# Patient Record
Sex: Female | Born: 1980 | Race: Black or African American | Hispanic: No | Marital: Single | Smoking: Current some day smoker
Health system: Southern US, Community
[De-identification: ages and names within clinical notes are randomized; demographics above are authoritative.]

## PROBLEM LIST (undated history)

## (undated) DIAGNOSIS — N390 Urinary tract infection, site not specified: Secondary | ICD-10-CM

## (undated) DIAGNOSIS — R51 Headache: Secondary | ICD-10-CM

## (undated) DIAGNOSIS — M5430 Sciatica, unspecified side: Secondary | ICD-10-CM

## (undated) DIAGNOSIS — R519 Headache, unspecified: Secondary | ICD-10-CM

## (undated) DIAGNOSIS — J45909 Unspecified asthma, uncomplicated: Secondary | ICD-10-CM

## (undated) HISTORY — PX: TONSILLECTOMY: SUR1361

## (undated) HISTORY — DX: Headache: R51

## (undated) HISTORY — PX: ENDOMETRIAL ABLATION: SHX621

## (undated) HISTORY — PX: CHOLECYSTECTOMY: SHX55

## (undated) HISTORY — PX: SHOULDER SURGERY: SHX246

## (undated) HISTORY — PX: APPENDECTOMY: SHX54

## (undated) HISTORY — DX: Unspecified asthma, uncomplicated: J45.909

## (undated) HISTORY — PX: KNEE ARTHROSCOPY: SUR90

## (undated) HISTORY — DX: Headache, unspecified: R51.9

---
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
## 1999-01-10 ENCOUNTER — Encounter: Payer: Self-pay | Admitting: Emergency Medicine

## 1999-01-10 ENCOUNTER — Emergency Department (HOSPITAL_COMMUNITY): Admission: EM | Admit: 1999-01-10 | Discharge: 1999-01-10 | Payer: Self-pay | Admitting: Emergency Medicine

## 1999-03-18 ENCOUNTER — Encounter: Admission: RE | Admit: 1999-03-18 | Discharge: 1999-06-16 | Payer: Self-pay | Admitting: Orthopedic Surgery

## 2000-01-27 ENCOUNTER — Encounter: Payer: Self-pay | Admitting: Orthopedic Surgery

## 2000-01-27 ENCOUNTER — Encounter: Admission: RE | Admit: 2000-01-27 | Discharge: 2000-01-27 | Payer: Self-pay | Admitting: Orthopedic Surgery

## 2000-07-18 ENCOUNTER — Other Ambulatory Visit: Admission: RE | Admit: 2000-07-18 | Discharge: 2000-07-18 | Payer: Self-pay | Admitting: Obstetrics and Gynecology

## 2000-12-28 ENCOUNTER — Ambulatory Visit (HOSPITAL_BASED_OUTPATIENT_CLINIC_OR_DEPARTMENT_OTHER): Admission: RE | Admit: 2000-12-28 | Discharge: 2000-12-29 | Payer: Self-pay | Admitting: Orthopedic Surgery

## 2001-07-27 ENCOUNTER — Encounter: Payer: Self-pay | Admitting: Obstetrics and Gynecology

## 2001-07-27 ENCOUNTER — Ambulatory Visit (HOSPITAL_COMMUNITY): Admission: RE | Admit: 2001-07-27 | Discharge: 2001-07-27 | Payer: Self-pay | Admitting: Obstetrics and Gynecology

## 2001-10-03 ENCOUNTER — Other Ambulatory Visit: Admission: RE | Admit: 2001-10-03 | Discharge: 2001-10-03 | Payer: Self-pay | Admitting: Obstetrics and Gynecology

## 2002-02-08 ENCOUNTER — Ambulatory Visit (HOSPITAL_COMMUNITY): Admission: RE | Admit: 2002-02-08 | Discharge: 2002-02-08 | Payer: Self-pay | Admitting: Obstetrics and Gynecology

## 2002-02-08 ENCOUNTER — Encounter (INDEPENDENT_AMBULATORY_CARE_PROVIDER_SITE_OTHER): Payer: Self-pay

## 2003-08-08 ENCOUNTER — Ambulatory Visit (HOSPITAL_COMMUNITY): Admission: RE | Admit: 2003-08-08 | Discharge: 2003-08-08 | Payer: Self-pay | Admitting: Internal Medicine

## 2004-05-08 ENCOUNTER — Encounter: Admission: RE | Admit: 2004-05-08 | Discharge: 2004-05-08 | Payer: Self-pay | Admitting: Family Medicine

## 2004-08-26 ENCOUNTER — Encounter: Admission: RE | Admit: 2004-08-26 | Discharge: 2004-08-26 | Payer: Self-pay | Admitting: Obstetrics and Gynecology

## 2005-11-08 ENCOUNTER — Emergency Department (HOSPITAL_COMMUNITY): Admission: EM | Admit: 2005-11-08 | Discharge: 2005-11-08 | Payer: Self-pay | Admitting: Emergency Medicine

## 2005-11-10 ENCOUNTER — Emergency Department (HOSPITAL_COMMUNITY): Admission: EM | Admit: 2005-11-10 | Discharge: 2005-11-10 | Payer: Self-pay | Admitting: Emergency Medicine

## 2005-11-12 ENCOUNTER — Emergency Department (HOSPITAL_COMMUNITY): Admission: EM | Admit: 2005-11-12 | Discharge: 2005-11-12 | Payer: Self-pay | Admitting: Emergency Medicine

## 2005-11-14 ENCOUNTER — Emergency Department (HOSPITAL_COMMUNITY): Admission: EM | Admit: 2005-11-14 | Discharge: 2005-11-14 | Payer: Self-pay | Admitting: *Deleted

## 2007-04-13 ENCOUNTER — Emergency Department (HOSPITAL_COMMUNITY): Admission: EM | Admit: 2007-04-13 | Discharge: 2007-04-14 | Payer: Self-pay | Admitting: *Deleted

## 2008-07-26 ENCOUNTER — Emergency Department (HOSPITAL_COMMUNITY): Admission: EM | Admit: 2008-07-26 | Discharge: 2008-07-26 | Payer: Self-pay | Admitting: Emergency Medicine

## 2009-04-04 ENCOUNTER — Emergency Department (HOSPITAL_COMMUNITY): Admission: EM | Admit: 2009-04-04 | Discharge: 2009-04-04 | Payer: Self-pay | Admitting: Emergency Medicine

## 2009-09-25 ENCOUNTER — Emergency Department (HOSPITAL_COMMUNITY): Admission: EM | Admit: 2009-09-25 | Discharge: 2009-09-25 | Payer: Self-pay | Admitting: Emergency Medicine

## 2010-07-20 ENCOUNTER — Emergency Department (HOSPITAL_COMMUNITY)
Admission: EM | Admit: 2010-07-20 | Discharge: 2010-07-20 | Payer: Self-pay | Source: Home / Self Care | Admitting: Emergency Medicine

## 2010-09-08 ENCOUNTER — Ambulatory Visit
Admission: RE | Admit: 2010-09-08 | Discharge: 2010-09-08 | Payer: Self-pay | Source: Home / Self Care | Attending: Family Medicine | Admitting: Family Medicine

## 2010-09-08 ENCOUNTER — Encounter: Payer: Self-pay | Admitting: Family Medicine

## 2010-09-08 DIAGNOSIS — M2390 Unspecified internal derangement of unspecified knee: Secondary | ICD-10-CM | POA: Insufficient documentation

## 2010-09-08 DIAGNOSIS — S83289A Other tear of lateral meniscus, current injury, unspecified knee, initial encounter: Secondary | ICD-10-CM | POA: Insufficient documentation

## 2010-09-09 ENCOUNTER — Ambulatory Visit (HOSPITAL_COMMUNITY)
Admission: RE | Admit: 2010-09-09 | Discharge: 2010-09-09 | Payer: Self-pay | Source: Home / Self Care | Attending: Family Medicine | Admitting: Family Medicine

## 2010-09-27 ENCOUNTER — Encounter: Payer: Self-pay | Admitting: Family Medicine

## 2010-10-06 ENCOUNTER — Ambulatory Visit
Admission: RE | Admit: 2010-10-06 | Discharge: 2010-10-06 | Payer: Self-pay | Source: Home / Self Care | Attending: Family Medicine | Admitting: Family Medicine

## 2010-10-06 ENCOUNTER — Other Ambulatory Visit: Payer: Self-pay | Admitting: Family Medicine

## 2010-10-06 ENCOUNTER — Emergency Department (HOSPITAL_COMMUNITY)
Admission: EM | Admit: 2010-10-06 | Discharge: 2010-10-06 | Payer: Self-pay | Source: Home / Self Care | Admitting: Emergency Medicine

## 2010-10-06 DIAGNOSIS — S83206A Unspecified tear of unspecified meniscus, current injury, right knee, initial encounter: Secondary | ICD-10-CM

## 2010-10-06 DIAGNOSIS — M25569 Pain in unspecified knee: Secondary | ICD-10-CM

## 2010-10-06 LAB — POCT URINALYSIS DIPSTICK
Bilirubin Urine: NEGATIVE
Ketones, ur: NEGATIVE mg/dL
Nitrite: NEGATIVE
Protein, ur: 30 mg/dL — AB
Specific Gravity, Urine: 1.025 (ref 1.005–1.030)
Urine Glucose, Fasting: NEGATIVE mg/dL
Urobilinogen, UA: 0.2 mg/dL (ref 0.0–1.0)
pH: 6 (ref 5.0–8.0)

## 2010-10-06 LAB — WET PREP, GENITAL: Yeast Wet Prep HPF POC: NONE SEEN

## 2010-10-07 LAB — URINE CULTURE: Culture  Setup Time: 201201311650

## 2010-10-07 LAB — GC/CHLAMYDIA PROBE AMP, GENITAL
Chlamydia, DNA Probe: NEGATIVE
GC Probe Amp, Genital: NEGATIVE

## 2010-10-08 NOTE — Assessment & Plan Note (Addendum)
Summary: NP,RT KNEE PAIN/ISSUES,MC   Vital Signs:  Patient profile:   30 year old female Height:      66 inches Weight:      320 pounds BMI:     51.84 BP sitting:   157 / 94  Vitals Entered By: Lillia Pauls CMA (September 08, 2010 2:58 PM)   History of Present Illness: Referred by Jaynee Eagles at [facility].  Shooting, spasming pain in posterior and lateral aspect of R knee since 1-1.5 years ago.    Occasionally the joint pops.  Hurts at night, sitting, standing.  2 previous arthroscopic surgeries for dislocation after slipping on acorn and for arthritis of R knee approx 7 and 5 years ago with long period of relief.  Able to walk but trouble now with steps.  Has gotten some relief from muscle relaxant but pain persists.  Ibuprofen irritates stomach.    Murphy-Wainer operated on R knee x 2, arthroscopy No mechanical locking up No giving way  Preventive Screening-Counseling & Management  Alcohol-Tobacco     Smoking Status: never  Allergies (verified): 1)  ! Ibuprofen  Past History:  Past Medical History: Morbid obesity Patellar dislocation,  ~2000, R  Past Surgical History: as above  R knee arthroscopy x 2,  ~ 2000  Social History: UnemployedSmoking Status:  never  Review of Systems       REVIEW OF SYSTEMS  GEN: No systemic complaints, no fevers, chills, sweats, or other acute illnesses MSK: Detailed in the HPI GI: tolerating PO intake without difficulty Neuro: No numbness, parasthesias, or tingling associated. Otherwise the pertinent positives of the ROS are noted above.    Physical Exam  General:  GEN: Well-developed,well-nourished,in no acute distress; alert,appropriate and cooperative throughout examination, obese HEENT: Normocephalic and atraumatic without obvious abnormalities. No apparent alopecia or balding. Ears, externally no deformities PULM: Breathing comfortably in no respiratory distress EXT: No clubbing, cyanosis, or edema PSYCH: Normally  interactive. Cooperative during the interview. Pleasant. Friendly and conversant. Not anxious or depressed appearing. Normal, full affect.  Msk:  R knee: 2 scars from prior arthroscopic surgeries.  normal ROM, no joint warmth, no redness over joints, no joint deformities, no joint instability, joint tenderness, crepitation, and joint swelling.  Pain with knee flexion. Positive Lochman's. Positive bounce test.  Negative anterior and posterior drawer.  Full ROM in R hip.    Knee Exam  Sensory:    Gross coordination and sensation were normal.    Motor:    Motor strength 5/5 bilaterally for quadriceps  Knee Exam:    Right:    Inspection:  Normal    Palpation:  Abnormal       Location:  lateral capsule    Stability:  stable    Tenderness:  no    Swelling:  diffuse    Erythema:  no    mild effusion    Range of Motion:       Flexion-Active: 115 degrees       Extension-Active: full       Flexion-Passive: 115 degrees       Extension-Passive: full    Left:    Inspection:  Normal    Palpation:  Normal    Stability:  stable    Tenderness:  no    Swelling:  no    Erythema:  no    Range of Motion:       Flexion-Active: full       Extension-Active: full  Flexion-Passive: full       Extension-Passive: full  Special tests:    bounce home test pos mcmurrays pos  Vertical patellar catching:    Right positive; Left negative Horizontal patellar catching:    Right positive; Left negative Patellofemoral joint crepitus:    Right positive; Left negative Anterior drawer:    Right negative; Left negative Posterior drawer:    Right negative; Left negative Gravity tibial sag :    Right negative; Left negative MCL:    Right negative; Left negative LCL:    Right negative; Left negative   Impression & Recommendations:  Problem # 1:  KNEE PAIN (JWJ-191.47) Assessment New Chronic posterior and lateral knee pain in a patient with a history of knee dislocation and arthritic  changes.  Pain may be secondary to meniscal tear. suspect internal derangement, lateteral vs. OA flare Recommended continued weight loss Switched analgesic to Mobic  Check weightbearing knee films  Knee Injection, R Patient verbally consented to procedure. Risks, benefits, and alternatives explained. Sterilely prepped with betadine. Ethyl cholride used for anesthesia. 9 cc Lidocaine 1% mixed with 1 cc of Kenalog 40 mg injected using the anterolateral approach without difficulty. No complications with procedure and tolerated well. Patient had decreased pain post-injection.   Her updated medication list for this problem includes:    Meloxicam 15 Mg Tabs (Meloxicam) .Marland Kitchen... 1 by mouth daily  Orders: Diagnostic X-Ray/Fluoroscopy (Diagnostic X-Ray/Flu) Joint Aspirate / Injection, Large (20610) Kenalog 10mg  (4units) (J3301)  Problem # 2:  INTERNAL DERANGEMENT, RIGHT KNEE (ICD-717.9) Assessment: New  Orders: Joint Aspirate / Injection, Large (20610) Kenalog 10mg  (4units) (J3301)  Complete Medication List: 1)  Meloxicam 15 Mg Tabs (Meloxicam) .Marland Kitchen.. 1 by mouth daily  Patient Instructions: 1)  After you have an injection of any joint, the numbing medicine will make it feel better for a few hours. Later tonight, it is common for the joint to feel worse. The steroid will take 48-72 hours to start working - it is the thing that will likely provide the most relief. 2)  Ice the joint where you had the injection at least 2-3 times a day for 20 minutes for 3 days. If have had swelling, pain in the joint itself, ice for 1 week. 3)  You can use an ice bag, frozen peas or corn, an ice pack - all work  4)  RECHECK IN 1 MONTH Prescriptions: MELOXICAM 15 MG TABS (MELOXICAM) 1 by mouth daily  #30 x 1   Entered and Authorized by:   Hannah Beat MD   Signed by:   Hannah Beat MD on 09/08/2010   Method used:   Print then Give to Patient   RxID:   [id number]    Orders Added: 1)  Diagnostic  X-Ray/Fluoroscopy [Diagnostic X-Ray/Flu] 2)  New Patient Level III [96295] 3)  Joint Aspirate / Injection, Large [20610] 4)  Kenalog 10mg  (4units) [J3301]  Appended Document: NP,RT KNEE PAIN/ISSUES,MC Lachman exam was negative. Documentation error.

## 2010-10-08 NOTE — Op Note (Signed)
Summary: Consent  Consent   Imported By: Marily Memos 09/09/2010 09:47:04  _____________________________________________________________________  External Attachment:    Type:   Image     Comment:   External Document

## 2010-10-13 ENCOUNTER — Ambulatory Visit (HOSPITAL_COMMUNITY)
Admission: RE | Admit: 2010-10-13 | Discharge: 2010-10-13 | Disposition: A | Discharge status: Home / Self Care | Payer: Self-pay | Source: Ambulatory Visit | Attending: Family Medicine | Admitting: Family Medicine

## 2010-10-13 ENCOUNTER — Inpatient Hospital Stay (INDEPENDENT_AMBULATORY_CARE_PROVIDER_SITE_OTHER)
Admission: RE | Admit: 2010-10-13 | Discharge: 2010-10-13 | Disposition: A | Discharge status: Home / Self Care | Payer: Self-pay | Source: Ambulatory Visit | Attending: Family Medicine | Admitting: Family Medicine

## 2010-10-13 DIAGNOSIS — M25469 Effusion, unspecified knee: Secondary | ICD-10-CM | POA: Insufficient documentation

## 2010-10-13 DIAGNOSIS — IMO0002 Reserved for concepts with insufficient information to code with codable children: Secondary | ICD-10-CM | POA: Insufficient documentation

## 2010-10-13 DIAGNOSIS — M25569 Pain in unspecified knee: Secondary | ICD-10-CM | POA: Insufficient documentation

## 2010-10-13 DIAGNOSIS — J019 Acute sinusitis, unspecified: Secondary | ICD-10-CM

## 2010-10-13 DIAGNOSIS — M1710 Unilateral primary osteoarthritis, unspecified knee: Secondary | ICD-10-CM | POA: Insufficient documentation

## 2010-10-13 DIAGNOSIS — M23349 Other meniscus derangements, anterior horn of lateral meniscus, unspecified knee: Secondary | ICD-10-CM | POA: Insufficient documentation

## 2010-10-13 DIAGNOSIS — S83206A Unspecified tear of unspecified meniscus, current injury, right knee, initial encounter: Secondary | ICD-10-CM

## 2010-10-14 NOTE — Assessment & Plan Note (Signed)
Summary: f/u,mc   Vital Signs:  Patient profile:   30 year old female BP sitting:   159 / 89  Vitals Entered By: Lillia Pauls CMA (October 06, 2010 1:49 PM)  History of Present Illness: 30 year old female, s/p knee injection last OV, did great for 2 weeks, now with return of pain, worsening of symptoms, and worsened effusion. Mechanical giving way. No locking up of joint.  Last OV: Referred by Jaynee Eagles at [facility].  Shooting, spasming pain in posterior and lateral aspect of R knee since 1-1.5 years ago.    Occasionally the joint pops.  Hurts at night, sitting, standing.  2 previous arthroscopic surgeries for dislocation after slipping on acorn and for arthritis of R knee approx 7 and 5 years ago with long period of relief.  Able to walk but trouble now with steps.  Has gotten some relief from muscle relaxant but pain persists.  Ibuprofen irritates stomach.    Murphy-Wainer operated on R knee x 2, arthroscopy No mechanical locking up No giving way   REVIEW OF SYSTEMS  GEN: No systemic complaints, no fevers, chills, sweats, or other acute illnesses MSK: Detailed in the HPI GI: tolerating PO intake without difficulty Neuro: No numbness, parasthesias, or tingling associated. Otherwise the pertinent positives of the ROS are noted above.    Allergies: 1)  ! Ibuprofen  Physical Exam  General:  GEN: Well-developed,well-nourished,in no acute distress; alert,appropriate and cooperative throughout examination HEENT: Normocephalic and atraumatic without obvious abnormalities. No apparent alopecia or balding. Ears, externally no deformities PULM: Breathing comfortably in no respiratory distress EXT: No clubbing, cyanosis, or edema PSYCH: Normally interactive. Cooperative during the interview. Pleasant. Friendly and conversant. Not anxious or depressed appearing. Normal, full affect.  Msk:  R knee: 2 scars from prior arthroscopic surgeries.    Full ext, flexion to 110. no joint  warmth, no redness over joints, no joint deformities, no joint instability  TTP with patellar compression, patellar facet loading. TTP medial and lateral joint lines mildly at pes + bounce home + mcmurrays   Lochman's. Positive bounce test.  Negative anterior and posterior drawer.  Full ROM in R hip.   Impression & Recommendations:  Problem # 1:  INTERNAL DERANGEMENT, RIGHT KNEE (ICD-717.9) Assessment Deteriorated Return of symptoms within 2 weeks of injection, now knee clinically worsened with ballotable effusion. Obtain MRI of the right knee to evaluate for probable degenerative meniscal tear. + mcmurrays and flexion pinch.  cont mobic, ice.  Image knee to define if correctable via surgical intervention.  Orders: MRI without Contrast (MRI w/o Contrast)  Problem # 2:  KNEE PAIN (ICD-719.46)  Her updated medication list for this problem includes:    Meloxicam 15 Mg Tabs (Meloxicam) .Marland Kitchen... 1 by mouth daily  Orders: MRI without Contrast (MRI w/o Contrast)  Complete Medication List: 1)  Meloxicam 15 Mg Tabs (Meloxicam) .Marland Kitchen.. 1 by mouth daily  Patient Instructions: 1)  MRI R Knee:   Orders Added: 1)  MRI without Contrast [MRI w/o Contrast] 2)  Est. Patient Level III [16109]  Appended Document: f/u,mc MRI SCHD FOR 10/13/10 TUES AT 7 PM AT [place]. GO TO [address] TO THE [facility] 1ST FLR TO REGISTER. [phone]

## 2010-11-17 ENCOUNTER — Encounter: Payer: Self-pay | Admitting: *Deleted

## 2011-01-08 ENCOUNTER — Emergency Department (HOSPITAL_COMMUNITY): Payer: Self-pay

## 2011-01-08 ENCOUNTER — Emergency Department (HOSPITAL_COMMUNITY)
Admission: EM | Admit: 2011-01-08 | Discharge: 2011-01-08 | Disposition: A | Discharge status: Home / Self Care | Payer: Self-pay | Attending: Emergency Medicine | Admitting: Emergency Medicine

## 2011-01-08 DIAGNOSIS — Y93K1 Activity, walking an animal: Secondary | ICD-10-CM | POA: Insufficient documentation

## 2011-01-08 DIAGNOSIS — Y92009 Unspecified place in unspecified non-institutional (private) residence as the place of occurrence of the external cause: Secondary | ICD-10-CM | POA: Insufficient documentation

## 2011-01-08 DIAGNOSIS — S6990XA Unspecified injury of unspecified wrist, hand and finger(s), initial encounter: Secondary | ICD-10-CM | POA: Insufficient documentation

## 2011-01-08 DIAGNOSIS — X500XXA Overexertion from strenuous movement or load, initial encounter: Secondary | ICD-10-CM | POA: Insufficient documentation

## 2011-01-08 DIAGNOSIS — S6980XA Other specified injuries of unspecified wrist, hand and finger(s), initial encounter: Secondary | ICD-10-CM | POA: Insufficient documentation

## 2011-01-08 DIAGNOSIS — S6390XA Sprain of unspecified part of unspecified wrist and hand, initial encounter: Secondary | ICD-10-CM | POA: Insufficient documentation

## 2011-01-13 ENCOUNTER — Emergency Department (HOSPITAL_COMMUNITY)
Admission: EM | Admit: 2011-01-13 | Discharge: 2011-01-13 | Disposition: A | Discharge status: Home / Self Care | Payer: Self-pay | Attending: Emergency Medicine | Admitting: Emergency Medicine

## 2011-01-13 ENCOUNTER — Emergency Department (HOSPITAL_COMMUNITY): Payer: Self-pay

## 2011-01-13 DIAGNOSIS — M25569 Pain in unspecified knee: Secondary | ICD-10-CM | POA: Insufficient documentation

## 2011-01-13 DIAGNOSIS — IMO0002 Reserved for concepts with insufficient information to code with codable children: Secondary | ICD-10-CM | POA: Insufficient documentation

## 2011-01-13 DIAGNOSIS — X58XXXA Exposure to other specified factors, initial encounter: Secondary | ICD-10-CM | POA: Insufficient documentation

## 2011-01-13 LAB — URINE MICROSCOPIC-ADD ON

## 2011-01-13 LAB — URINALYSIS, ROUTINE W REFLEX MICROSCOPIC
Bilirubin Urine: NEGATIVE
Glucose, UA: NEGATIVE mg/dL
Specific Gravity, Urine: 1.021 (ref 1.005–1.030)
pH: 5.5 (ref 5.0–8.0)

## 2011-01-14 LAB — URINE CULTURE

## 2011-01-22 NOTE — H&P (Signed)
[facility]  Patient:    Tiffany Conley, Tiffany Conley Visit Number: [id number] MRN: [id number]          Service Type: Attending:  Guy Sandifer. Arleta Creek, M.D. Dictated by:   Guy Sandifer Arleta Creek, M.D. Adm. Date:  02/08/02                           History and Physical  HISTORY OF PRESENT ILLNESS:   This patient is a 30 year old, single, black female, G0, P0 with known endometriosis, who has increasing pelvic pain and irregular bleeding.  She is status post laparoscopy and ablation of endometriosis in 1998.  Since then, she has continued to have irregular bleeding.  This was controlled with Provera.  However, the bleeding has become aggressively irregular.  Her pain has also increased.  Ultrasound on July 27, 2001, at [facility] revealed a normal uterus and left ovary.  The right ovary could not be visualized.  The patient was given a trial of Lupron Depot.  After two injection, she had prominent vasomotor symptoms but no relief of her pain.  She was then given continuous birth control pills over several days to regain control of her bleeding.  Repeat ultrasound in my office on December 19, 2001, revealed uterus measuring 6.86 x 4.37 x 5.06 cm.  The endometrial stripe was 3.1 mm.  There was a possible 1.7 cm intramural fibroid noted.  The right ovary could not be visualized, and the left ovary was normal in size.  The patient is morbidly obese, and her pelvic exam is totally compromised.  After discussion of the options, open laparoscopy as well as hysteroscopy with possible resectoscope and D&C is discussed.  Potential complications have been discussed, and all questions have been answered.  PAST MEDICAL HISTORY:         1. Morbid obesity.                               2. Pneumonia in elementary school                               3. Endometriosis.  PAST SURGICAL HISTORY:        1. Tonsillectomy at age 49.                               2. Laparoscopic  cholecystectomy in 1996.                               3. Laparoscopic appendectomy in 1997.                               4. Laparoscopy with ablation of endometriosis                                  in 1998.   MEDICATIONS:                 None at present.  ALLERGIES:                    IBUPROFEN leading to nausea.  FAMILY HISTORY:  Colon cancer in mother, seizure disorder in maternal uncle.  SOCIAL HISTORY:               The patient denies tobacco, alcohol, or drug abuse.  REVIEW OF SYSTEMS:            Negative except as above.  PHYSICAL EXAMINATION:  VITAL SIGNS:                  Height 5 feet 6-1/2 inches, weight 343 pounds in January of this year.  Blood pressure 110/62.  NECK:                         Without thyromegaly.  LUNGS:                        Clear to auscultation.  HEART:                        Regular rate and rhythm.  BACK:                         Without CVA tenderness.  BREASTS:                      Without mass, retraction, discharge.  ABDOMEN:                      Morbidly obese, nontender, without palpable masses.  PELVIC:                       Vulva, vagina, cervix without lesion.  Pelvic exam is totally compromised without palpable masses.  EXTREMITIES:                  Grossly within normal limits.  NEUROLOGIC:                   Grossly within normal limits.  ASSESSMENT:                   1. Recurrent pelvic pain with known                                  endometriosis unresponsive to conservative                                  management.                               2. Irregular menses.  PLAN:                         Hysteroscopy, possible resectoscope, D&C, open laparoscopy. Dictated by:   Guy Sandifer Arleta Creek, M.D. Attending:  Guy Sandifer Arleta Creek, M.D. DD:  02/05/02 TD:  02/05/02 Job: [id number]

## 2011-01-22 NOTE — Op Note (Signed)
[facility]  Patient:    Tiffany Conley, Tiffany Conley Visit Number: [id number] MRN: [id number]          Service Type: DSU Location: [facility] Attending Physician:  Soledad Gerlach Dictated by:   Guy Sandifer Arleta Creek, M.D. Proc. Date: 02/08/02 Admit Date:  02/08/2002                             Operative Report  PREOPERATIVE DIAGNOSIS:       Pelvic pain.  Irregular menses.  POSTOPERATIVE DIAGNOSIS:      Endometriosis.  Irregular menses.  OPERATION:                    Laparoscopy with ablation of endometriosis and hysteroscopy D&C.  SURGEON:                      Guy Sandifer. Arleta Creek, M.D.  ASSISTANT:  ANESTHESIA:                   General endotracheal anesthesia by Belva Agee, M.D.  ESTIMATED BLOOD LOSS:         Drops.  I&O SORBITOL:                 Even.  INDICATIONS:                  This patient is a 30 year old single black female, gravida 0, para 0, with known endometriosis, increasing pelvic pain, and irregular menses.  Details are dictated in the history and physical. Laparoscopy and hysteroscopy with D&C is discussed with the patient. Potential risks and complications have been discussed preoperatively including, but not limited to, infection, bowel, bladder, or ureteral damage, bleeding requiring transfusion of blood products with possible transfusion reaction, HIV, and hepatitis acquisition, DVT, PE, and pneumonia, recurrent pelvic pain, uterine perforation, hysterectomy, and laparotomy.  All questions have been answered and consent is signed and on the chart.  FINDINGS:                     Intrauterine cavity contains no visualized abnormal structures.  Abdominally, upper abdomen is normal.  The uterus is normal in contour and size.  Anterior cul-de-sac contains a single powderburn type lesion of endometriosis just to the left of the midline on the vesicouterine peritoneum.  Tubes and ovaries are normal bilaterally.   Pelvic side walls are normal.  Posterior cul-de-sac contains two sites of powderburn type implants of endometriosis to the left and the right of the midline in the upper cul-de-sac.  DESCRIPTION OF PROCEDURE:     The patient was taken to the operating room and placed in the dorsal supine position where general anesthesia is induced via endotracheal intubation.  She was then placed in the dorsal lithotomy position where she was prepped abdominally and vaginally.  The bladder was straight catheterized and she was draped in a sterile fashion.  Bivalve speculum was placed in the vagina and the anterior cervical lip was injected with 1% Xylocaine and then grasped with a single tooth tenaculum.  Paracervical block is placed at the 2, 4, 5, 7, 8, and 10 oclock positions with approximately 10 cc of 1% Xylocaine plain.  The cervix was gently and progressively dilated to a 29 Pratt dilator.  Diagnostic hysteroscope is placed in the cervix and advanced under direct visualization using Sorbitol distending media. Visualization in the endometrium is  difficult secondary to poor distention of the uterine cavity.  However, no abnormal structures were noted.  The hysteroscope was withdrawn, sharp curettage is carried out, and the single tooth tenaculum is replaced with the Hulka tenaculum.  Attention is then turned to the abdomen.  The patient is morbidly obese.  An infraumbilical incision is made and dissection is carried out bluntly to the level of the fascia.  However, the incision is so deep and visualization is so difficult, it is felt that continued attempts at entering the fascia cannot be done under adequate visualization.  Therefore, a Kocher clamp is used to grasp the fascia in the midline and a Veress needle is placed.  Syringe and droptest are normal. However, upon insufflation with low pressures, no gas flow is noted. The Veress needle is withdrawn, replaced again, and again normal syringe  and droptest are noted, but again no flow is noted upon low insufflation pressures.  Therefore, the decision is made to use a long disposable trocar sleeve.  The abdominal wall was grasped with towel clamps bilaterally in an attempt to elevate it somewhat.  The patient is placed in Trendelenburg position.  The long disposable 10/11 trocar sleeve is then placed without difficulty.  Insufflation is carried out.  The above findings are noted. Bipolar cautery was used to ablate the areas of endometriosis described. Pneumoperitoneum is then reduced and the umbilical trocar sleeve is removed. The skin incision is closed with 3-0 Vicryl suture using mattress stitches. The incision is injected with 0.5% plain Marcaine.  Hulka tenaculum is removed and no bleeding is noted.  All counts are correct.  The patient is awakened and taken to the recovery room in stable condition. Dictated by:   Guy Sandifer Arleta Creek, M.D. Attending Physician:  Soledad Gerlach DD:  02/08/02 TD:  02/10/02 Job: [id number]
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
## 2011-04-19 ENCOUNTER — Emergency Department (HOSPITAL_COMMUNITY)
Admission: EM | Admit: 2011-04-19 | Discharge: 2011-04-19 | Disposition: A | Discharge status: Home / Self Care | Payer: Self-pay | Attending: Emergency Medicine | Admitting: Emergency Medicine

## 2011-04-19 ENCOUNTER — Emergency Department (HOSPITAL_COMMUNITY): Payer: Self-pay

## 2011-04-19 DIAGNOSIS — R197 Diarrhea, unspecified: Secondary | ICD-10-CM | POA: Insufficient documentation

## 2011-04-19 DIAGNOSIS — R109 Unspecified abdominal pain: Secondary | ICD-10-CM | POA: Insufficient documentation

## 2011-04-19 DIAGNOSIS — N390 Urinary tract infection, site not specified: Secondary | ICD-10-CM | POA: Insufficient documentation

## 2011-04-19 DIAGNOSIS — R112 Nausea with vomiting, unspecified: Secondary | ICD-10-CM | POA: Insufficient documentation

## 2011-04-19 LAB — CBC
HCT: 42.9 % (ref 36.0–46.0)
Hemoglobin: 14.6 g/dL (ref 12.0–15.0)
MCH: 31.4 pg (ref 26.0–34.0)
MCHC: 34 g/dL (ref 30.0–36.0)
RBC: 4.65 MIL/uL (ref 3.87–5.11)

## 2011-04-19 LAB — URINALYSIS, ROUTINE W REFLEX MICROSCOPIC
Glucose, UA: NEGATIVE mg/dL
Glucose, UA: NEGATIVE mg/dL
Protein, ur: NEGATIVE mg/dL
Protein, ur: NEGATIVE mg/dL
Specific Gravity, Urine: 1.02 (ref 1.005–1.030)
pH: 5.5 (ref 5.0–8.0)
pH: 5.5 (ref 5.0–8.0)

## 2011-04-19 LAB — DIFFERENTIAL
Basophils Absolute: 0 10*3/uL (ref 0.0–0.1)
Lymphocytes Relative: 19 % (ref 12–46)
Monocytes Absolute: 0.8 10*3/uL (ref 0.1–1.0)
Monocytes Relative: 7 % (ref 3–12)
Neutro Abs: 9 10*3/uL — ABNORMAL HIGH (ref 1.7–7.7)
Neutrophils Relative %: 71 % (ref 43–77)

## 2011-04-19 LAB — COMPREHENSIVE METABOLIC PANEL
ALT: 53 U/L — ABNORMAL HIGH (ref 0–35)
AST: 32 U/L (ref 0–37)
Albumin: 3.8 g/dL (ref 3.5–5.2)
CO2: 25 mEq/L (ref 19–32)
Calcium: 9.5 mg/dL (ref 8.4–10.5)
Creatinine, Ser: 0.74 mg/dL (ref 0.50–1.10)
Sodium: 135 mEq/L (ref 135–145)
Total Protein: 7.7 g/dL (ref 6.0–8.3)

## 2011-04-19 LAB — URINE MICROSCOPIC-ADD ON

## 2011-04-24 LAB — URINE CULTURE: Colony Count: >=100000

## 2011-06-21 LAB — GC/CHLAMYDIA PROBE AMP, GENITAL: Chlamydia, DNA Probe: POSITIVE — AB

## 2011-06-21 LAB — URINALYSIS, ROUTINE W REFLEX MICROSCOPIC
Bilirubin Urine: NEGATIVE
Glucose, UA: NEGATIVE
Ketones, ur: NEGATIVE
Nitrite: NEGATIVE
Protein, ur: NEGATIVE
pH: 6.5

## 2011-06-21 LAB — WET PREP, GENITAL
Trich, Wet Prep: NONE SEEN
Yeast Wet Prep HPF POC: NONE SEEN

## 2011-06-21 LAB — URINE MICROSCOPIC-ADD ON

## 2011-06-21 LAB — POCT PREGNANCY, URINE: Operator id: 29459

## 2011-09-20 ENCOUNTER — Encounter (HOSPITAL_BASED_OUTPATIENT_CLINIC_OR_DEPARTMENT_OTHER): Payer: Self-pay | Admitting: Family Medicine

## 2011-09-20 ENCOUNTER — Emergency Department (HOSPITAL_BASED_OUTPATIENT_CLINIC_OR_DEPARTMENT_OTHER)
Admission: EM | Admit: 2011-09-20 | Discharge: 2011-09-20 | Disposition: A | Discharge status: Home / Self Care | Payer: Self-pay | Attending: Emergency Medicine | Admitting: Emergency Medicine

## 2011-09-20 DIAGNOSIS — N390 Urinary tract infection, site not specified: Secondary | ICD-10-CM | POA: Insufficient documentation

## 2011-09-20 DIAGNOSIS — R1110 Vomiting, unspecified: Secondary | ICD-10-CM | POA: Insufficient documentation

## 2011-09-20 DIAGNOSIS — R350 Frequency of micturition: Secondary | ICD-10-CM | POA: Insufficient documentation

## 2011-09-20 HISTORY — DX: Urinary tract infection, site not specified: N39.0

## 2011-09-20 LAB — URINALYSIS, ROUTINE W REFLEX MICROSCOPIC
Glucose, UA: NEGATIVE mg/dL
Ketones, ur: NEGATIVE mg/dL
Protein, ur: NEGATIVE mg/dL
Urobilinogen, UA: 0.2 mg/dL (ref 0.0–1.0)

## 2011-09-20 LAB — URINE MICROSCOPIC-ADD ON

## 2011-09-20 MED ORDER — CIPROFLOXACIN HCL 500 MG PO TABS
500.0000 mg | ORAL_TABLET | Freq: Once | ORAL | Status: AC
  Administered 2011-09-20: 500 mg via ORAL
  Filled 2011-09-20: qty 1

## 2011-09-20 MED ORDER — CIPROFLOXACIN HCL 500 MG PO TABS: 500.0000 mg | ORAL_TABLET | Freq: Two times a day (BID) | ORAL | Status: AC

## 2011-09-20 NOTE — ED Notes (Addendum)
Pt c/o UTI sx for the last few days.  Pt has extensive hx of same and states "sx are exactly like before"  UA given in triage

## 2011-09-20 NOTE — ED Provider Notes (Signed)
History     CSN: [id number]  Arrival date & time 09/20/11  1903   First MD Initiated Contact with Patient 09/20/11 2105      Chief Complaint  Patient presents with  . Urinary Frequency    (Consider location/radiation/quality/duration/timing/severity/associated sxs/prior treatment) Patient is a 31 y.o. female presenting with frequency. The history is provided by the patient. No language interpreter was used.  Urinary Frequency This is a new problem. The current episode started today. The problem occurs constantly. The problem has been gradually worsening. Associated symptoms include vomiting. The symptoms are aggravated by nothing. She has tried acetaminophen for the symptoms. The treatment provided no relief.  Pt reports she has chronic uti's.  Pt has seen urology.  Pt reports cipro works best.  Past Medical History  Diagnosis Date  . UTI (lower urinary tract infection)     Past Surgical History  Procedure Date  . Cholecystectomy   . Appendectomy   . Endometrial ablation   . Tonsillectomy   . Knee arthroscopy     No family history on file.  History  Substance Use Topics  . Smoking status: Former Smoker    Quit date: 08/20/2011  . Smokeless tobacco: Not on file  . Alcohol Use: No    OB History    Grav Para Term Preterm Abortions TAB SAB Ect Mult Living                  Review of Systems  Gastrointestinal: Positive for vomiting.  Genitourinary: Positive for frequency.  All other systems reviewed and are negative.    Allergies  Ibuprofen and Citrus  Home Medications   Current Outpatient Rx  Name Route Sig Dispense Refill  . IBUPROFEN 200 MG PO TABS Oral Take 800 mg by mouth every 6 (six) hours as needed. For pain      BP 138/74  Pulse 69  Temp(Src) 98.5 F (36.9 C) (Oral)  Resp 16  SpO2 100%  LMP 08/27/2011  Physical Exam  Nursing note and vitals reviewed. Constitutional: She is oriented to person, place, and time. She appears well-developed  and well-nourished.  HENT:  Head: Normocephalic and atraumatic.  Right Ear: External ear normal.  Left Ear: External ear normal.  Nose: Nose normal.  Mouth/Throat: Oropharynx is clear and moist.  Eyes: Conjunctivae are normal. Pupils are equal, round, and reactive to light.  Neck: Normal range of motion. Neck supple.  Cardiovascular: Normal rate and normal heart sounds.   Pulmonary/Chest: Effort normal.  Abdominal: Soft.  Musculoskeletal: Normal range of motion.  Neurological: She is alert and oriented to person, place, and time.  Skin: Skin is warm.  Psychiatric: She has a normal mood and affect.    ED Course  Procedures (including critical care time)  Labs Reviewed  URINALYSIS, ROUTINE W REFLEX MICROSCOPIC - Abnormal; Notable for the following:    APPearance CLOUDY (*)    Hgb urine dipstick SMALL (*)    Leukocytes, UA LARGE (*)    All other components within normal limits  URINE MICROSCOPIC-ADD ON - Abnormal; Notable for the following:    Bacteria, UA MANY (*)    All other components within normal limits  PREGNANCY, URINE   No results found.   No diagnosis found.    MDM  Pt has uti.  Pt advised to follow up with her MD.  Pt given RX for cipro        Langston Masker, [place] 09/20/11 2238

## 2011-09-20 NOTE — ED Notes (Signed)
Pt sts she has chronic utis and "I know I have one". Pt c/o low back pain and increased frequency.

## 2011-09-22 NOTE — ED Provider Notes (Signed)
Medical screening examination/treatment/procedure(s) were performed by non-physician practitioner and as supervising physician I was immediately available for consultation/collaboration.  Nioma Mccubbins, MD 09/22/11 0944 

## 2011-10-13 ENCOUNTER — Emergency Department (HOSPITAL_COMMUNITY): Payer: Self-pay

## 2011-10-13 ENCOUNTER — Emergency Department (HOSPITAL_COMMUNITY)
Admission: EM | Admit: 2011-10-13 | Discharge: 2011-10-13 | Disposition: A | Discharge status: Home / Self Care | Payer: Self-pay | Attending: Emergency Medicine | Admitting: Emergency Medicine

## 2011-10-13 ENCOUNTER — Encounter (HOSPITAL_COMMUNITY): Payer: Self-pay | Admitting: Emergency Medicine

## 2011-10-13 DIAGNOSIS — R1013 Epigastric pain: Secondary | ICD-10-CM | POA: Insufficient documentation

## 2011-10-13 DIAGNOSIS — R112 Nausea with vomiting, unspecified: Secondary | ICD-10-CM | POA: Insufficient documentation

## 2011-10-13 DIAGNOSIS — R197 Diarrhea, unspecified: Secondary | ICD-10-CM | POA: Insufficient documentation

## 2011-10-13 DIAGNOSIS — R10819 Abdominal tenderness, unspecified site: Secondary | ICD-10-CM | POA: Insufficient documentation

## 2011-10-13 LAB — URINALYSIS, ROUTINE W REFLEX MICROSCOPIC
Nitrite: NEGATIVE
Specific Gravity, Urine: 1.022 (ref 1.005–1.030)
Urobilinogen, UA: 0.2 mg/dL (ref 0.0–1.0)
pH: 6 (ref 5.0–8.0)

## 2011-10-13 LAB — DIFFERENTIAL
Eosinophils Absolute: 0.5 10*3/uL (ref 0.0–0.7)
Lymphs Abs: 3.1 10*3/uL (ref 0.7–4.0)
Neutro Abs: 7.1 10*3/uL (ref 1.7–7.7)
Neutrophils Relative %: 61 % (ref 43–77)

## 2011-10-13 LAB — CBC
MCH: 31.4 pg (ref 26.0–34.0)
Platelets: 236 10*3/uL (ref 150–400)
RBC: 4.37 MIL/uL (ref 3.87–5.11)
WBC: 11.6 10*3/uL — ABNORMAL HIGH (ref 4.0–10.5)

## 2011-10-13 LAB — BASIC METABOLIC PANEL
Chloride: 101 mEq/L (ref 96–112)
GFR calc Af Amer: >90 mL/min (ref >90)
GFR calc non Af Amer: >90 mL/min (ref >90)
Glucose, Bld: 98 mg/dL (ref 70–99)
Potassium: 3.7 mEq/L (ref 3.5–5.1)
Sodium: 134 mEq/L — ABNORMAL LOW (ref 135–145)

## 2011-10-13 LAB — LIPASE, BLOOD: Lipase: 25 U/L (ref 11–59)

## 2011-10-13 LAB — URINE MICROSCOPIC-ADD ON

## 2011-10-13 MED ORDER — PROMETHAZINE HCL 25 MG PO TABS: 25.0000 mg | ORAL_TABLET | Freq: Four times a day (QID) | ORAL | Status: DC | PRN

## 2011-10-13 MED ORDER — ONDANSETRON 4 MG PO TBDP: 4.0000 mg | ORAL_TABLET | Freq: Four times a day (QID) | ORAL | Status: DC | PRN

## 2011-10-13 MED ORDER — ONDANSETRON HCL 4 MG/2ML IJ SOLN
4.0000 mg | INTRAMUSCULAR | Status: AC | PRN
  Administered 2011-10-13 (×2): 4 mg via INTRAVENOUS
  Filled 2011-10-13 (×2): qty 2

## 2011-10-13 MED ORDER — SODIUM CHLORIDE 0.9 % IV BOLUS (SEPSIS)
1000.0000 mL | Freq: Once | INTRAVENOUS | Status: AC
  Administered 2011-10-13: 1000 mL via INTRAVENOUS

## 2011-10-13 MED ORDER — NITROFURANTOIN MONOHYD MACRO 100 MG PO CAPS: 100.0000 mg | ORAL_CAPSULE | Freq: Two times a day (BID) | ORAL | Status: AC

## 2011-10-13 NOTE — ED Notes (Signed)
Vital signs stable. 

## 2011-10-13 NOTE — ED Notes (Signed)
Patient transported to Ultrasound 

## 2011-10-13 NOTE — ED Notes (Signed)
MD at bedside. 

## 2011-10-13 NOTE — ED Provider Notes (Signed)
History     CSN: [id number]  Arrival date & time 10/13/11  5621   First MD Initiated Contact with Patient 10/13/11 [phone]      Chief Complaint  Patient presents with  . Urinary Tract Infection    (Consider location/radiation/quality/duration/timing/severity/associated sxs/prior treatment) HPI Comments: Patient presents emergency department with chief complaint of nausea vomiting and diarrhea since Saturday (5 days ago).  Patient states she's been having diarrhea about every hour with vomiting, but the vomiting episodes resolved Monday. In addition patient states that she's having some epigastric abdominal discomfort but denies it being painful. Symptoms are not associated with fevers, night sweats, chills, blood in stool hematemesis, melena, lightheadedness, chest pain, shortness of breath.  Patient states that she was treated at [facility] 2 weeks ago for urinary tract infection with Cipro.  She currently is still having frequency but she is no longer having dysuria.  Patient also denies back pain or suprapubic pain. UTI 2 weeks ago, completed cipro.   Patient has a surgical history of cholecystectomy and appendectomy.  Patient is a 31 y.o. female presenting with urinary tract infection. The history is provided by the patient.  Urinary Tract Infection Associated symptoms include abdominal pain and vomiting. Pertinent negatives include no arthralgias, chest pain, chills, coughing, fatigue, fever, headaches, joint swelling, myalgias, nausea, neck pain, numbness, rash or weakness.    Past Medical History  Diagnosis Date  . UTI (lower urinary tract infection)     Past Surgical History  Procedure Date  . Cholecystectomy   . Appendectomy   . Endometrial ablation   . Tonsillectomy   . Knee arthroscopy     History reviewed. No pertinent family history.  History  Substance Use Topics  . Smoking status: Former Smoker    Quit date: 08/20/2011  . Smokeless tobacco: Not on file  .  Alcohol Use: No    OB History    Grav Para Term Preterm Abortions TAB SAB Ect Mult Living                  Review of Systems  Constitutional: Positive for activity change and appetite change. Negative for fever, chills, fatigue and unexpected weight change.  HENT: Negative for neck pain, neck stiffness and dental problem.   Eyes: Negative for visual disturbance.  Respiratory: Negative for cough, chest tightness, shortness of breath and wheezing.   Cardiovascular: Negative for chest pain and leg swelling.  Gastrointestinal: Positive for vomiting, abdominal pain and diarrhea. Negative for nausea, constipation, blood in stool, abdominal distention, anal bleeding and rectal pain.  Genitourinary: Negative for dysuria, urgency, hematuria, flank pain and difficulty urinating.       Patient denies bowel and bladder incontinence.  Musculoskeletal: Negative for myalgias, back pain, joint swelling, arthralgias and gait problem.  Skin: Negative for rash.  Neurological: Negative for dizziness, syncope, speech difficulty, weakness, numbness and headaches.  Hematological: Does not bruise/bleed easily.  All other systems reviewed and are negative.    Allergies  Ibuprofen and Citrus  Home Medications   Current Outpatient Rx  Name Route Sig Dispense Refill  . CIPROFLOXACIN HCL 500 MG PO TABS Oral Take 500 mg by mouth 2 (two) times daily.    . TETRAHYDROZOLINE HCL 0.05 % OP SOLN Both Eyes Place 2 drops into both eyes 2 (two) times daily.    . IBUPROFEN 200 MG PO TABS Oral Take 800 mg by mouth every 6 (six) hours as needed. For pain      BP 128/60  Pulse 77  Temp(Src) 97.7 F (36.5 C) (Oral)  Resp 18  SpO2 100%  LMP 10/03/2011  Physical Exam  Nursing note and vitals reviewed. Constitutional: Vital signs are normal. She appears well-developed and well-nourished. No distress.  HENT:  Head: Normocephalic and atraumatic.  Mouth/Throat: Uvula is midline, oropharynx is clear and moist and  mucous membranes are normal.  Eyes: Conjunctivae and EOM are normal. Pupils are equal, round, and reactive to light.  Neck: Normal range of motion and full passive range of motion without pain. Neck supple. No spinous process tenderness and no muscular tenderness present. No rigidity. No Brudzinski's sign noted.  Cardiovascular: Normal rate and regular rhythm.   Pulmonary/Chest: Effort normal and breath sounds normal. No accessory muscle usage. Not tachypneic. No respiratory distress.  Abdominal: Soft. Normal appearance and bowel sounds are normal. She exhibits no distension, no ascites, no pulsatile midline mass and no mass. There is tenderness in the right upper quadrant and epigastric area. There is no CVA tenderness. No hernia.    Lymphadenopathy:    She has no cervical adenopathy.  Neurological: She is alert.  Skin: Skin is warm and dry. No rash noted. She is not diaphoretic.  Psychiatric: She has a normal mood and affect. Her speech is normal and behavior is normal.    ED Course  Procedures (including critical care time)  Labs Reviewed  URINALYSIS, ROUTINE W REFLEX MICROSCOPIC - Abnormal; Notable for the following:    APPearance CLOUDY (*)    Leukocytes, UA SMALL (*)    All other components within normal limits  CBC - Abnormal; Notable for the following:    WBC 11.6 (*)    All other components within normal limits  BASIC METABOLIC PANEL - Abnormal; Notable for the following:    Sodium 134 (*)    All other components within normal limits  URINE MICROSCOPIC-ADD ON - Abnormal; Notable for the following:    Squamous Epithelial / LPF FEW (*)    Bacteria, UA MANY (*)    All other components within normal limits  DIFFERENTIAL  POCT PREGNANCY, URINE  LIPASE, BLOOD   No results found.   No diagnosis found.    MDM  N//V/D  Patient abdominal series is negative for small bowel junction.  Results of exam have been discussed with patient.  Patient's urinalysis is questionable  for persistent UTI patient will be given a prescription of Macrobid upon discharge.  Patient has been advised to return to the emergency department if her symptoms of diarrhea do not resolve.  Patient will followup primary care.  The patient would be stable in no acute distress prior to discharge and has no complaints.         Jaci Carrel, [place] 10/13/11 1256

## 2011-10-13 NOTE — ED Notes (Signed)
Patient is resting comfortably. 

## 2011-10-13 NOTE — ED Notes (Signed)
Seen at [facility] 2 weeks ago for a UTI and was given Cipro.  Saturday, began having vomiting and diarrhea and continued to have UTI symptoms (back pain, and urinary frequency).  Pt states she has taken Cipro in the past with no problems.  Pt states she gets frequent UTIs and usually has vomiting and diarrhea with the UTI.

## 2011-10-14 NOTE — ED Provider Notes (Signed)
Medical screening examination/treatment/procedure(s) were performed by non-physician practitioner and as supervising physician I was immediately available for consultation/collaboration.   Forbes Cellar, MD 10/14/11 1434

## 2011-10-21 ENCOUNTER — Encounter (HOSPITAL_BASED_OUTPATIENT_CLINIC_OR_DEPARTMENT_OTHER): Payer: Self-pay | Admitting: *Deleted

## 2011-10-21 ENCOUNTER — Emergency Department (HOSPITAL_BASED_OUTPATIENT_CLINIC_OR_DEPARTMENT_OTHER)
Admission: EM | Admit: 2011-10-21 | Discharge: 2011-10-21 | Disposition: A | Discharge status: Home / Self Care | Payer: Self-pay | Attending: Emergency Medicine | Admitting: Emergency Medicine

## 2011-10-21 DIAGNOSIS — R197 Diarrhea, unspecified: Secondary | ICD-10-CM | POA: Insufficient documentation

## 2011-10-21 DIAGNOSIS — N390 Urinary tract infection, site not specified: Secondary | ICD-10-CM | POA: Insufficient documentation

## 2011-10-21 DIAGNOSIS — R110 Nausea: Secondary | ICD-10-CM | POA: Insufficient documentation

## 2011-10-21 LAB — URINE MICROSCOPIC-ADD ON

## 2011-10-21 LAB — COMPREHENSIVE METABOLIC PANEL
ALT: 16 U/L (ref 0–35)
Alkaline Phosphatase: 77 U/L (ref 39–117)
CO2: 25 mEq/L (ref 19–32)
Chloride: 103 mEq/L (ref 96–112)
GFR calc Af Amer: >90 mL/min (ref >90)
GFR calc non Af Amer: >90 mL/min (ref >90)
Glucose, Bld: 84 mg/dL (ref 70–99)
Potassium: 3.7 mEq/L (ref 3.5–5.1)
Sodium: 137 mEq/L (ref 135–145)

## 2011-10-21 LAB — URINALYSIS, ROUTINE W REFLEX MICROSCOPIC
Hgb urine dipstick: NEGATIVE
Protein, ur: NEGATIVE mg/dL
Urobilinogen, UA: 0.2 mg/dL (ref 0.0–1.0)

## 2011-10-21 LAB — CBC
Hemoglobin: 14.6 g/dL (ref 12.0–15.0)
MCH: 31.3 pg (ref 26.0–34.0)
RBC: 4.66 MIL/uL (ref 3.87–5.11)

## 2011-10-21 LAB — DIFFERENTIAL
Lymphocytes Relative: 28 % (ref 12–46)
Lymphs Abs: 2 10*3/uL (ref 0.7–4.0)
Monocytes Relative: 17 % — ABNORMAL HIGH (ref 3–12)
Neutro Abs: 3.6 10*3/uL (ref 1.7–7.7)
Neutrophils Relative %: 49 % (ref 43–77)

## 2011-10-21 LAB — PREGNANCY, URINE: Preg Test, Ur: NEGATIVE

## 2011-10-21 MED ORDER — ONDANSETRON HCL 4 MG/2ML IJ SOLN
4.0000 mg | Freq: Once | INTRAMUSCULAR | Status: DC
  Filled 2011-10-21: qty 2

## 2011-10-21 MED ORDER — CEPHALEXIN 500 MG PO CAPS: 500.0000 mg | ORAL_CAPSULE | Freq: Four times a day (QID) | ORAL | Status: AC

## 2011-10-21 MED ORDER — METRONIDAZOLE 500 MG PO TABS: 500.0000 mg | ORAL_TABLET | Freq: Two times a day (BID) | ORAL | Status: AC

## 2011-10-21 MED ORDER — SODIUM CHLORIDE 0.9 % IV SOLN
Freq: Once | INTRAVENOUS | Status: AC
  Administered 2011-10-21: 19:00:00 via INTRAVENOUS

## 2011-10-21 MED ORDER — LOPERAMIDE HCL 2 MG PO CAPS
4.0000 mg | ORAL_CAPSULE | Freq: Once | ORAL | Status: AC
  Administered 2011-10-21: 4 mg via ORAL
  Filled 2011-10-21: qty 2

## 2011-10-21 NOTE — ED Provider Notes (Signed)
History     CSN: [id number]  Arrival date & time 10/21/11  1646   First MD Initiated Contact with Patient 10/21/11 1730      Chief Complaint  Patient presents with  . Diarrhea    (Consider location/radiation/quality/duration/timing/severity/associated sxs/prior treatment) Patient is a 31 y.o. female presenting with diarrhea. The history is provided by the patient. The history is limited by a language barrier. No language interpreter was used.  Diarrhea The primary symptoms include nausea and diarrhea. Primary symptoms do not include vomiting. The illness began yesterday. The problem has been gradually worsening.  The illness does not include chills.  Pt reports she has had multiple episodes of diarrhea.  Pt reports she has been treated with cipro and then macrobid for a uti.   Pt reports she has had muliple episodes of diarrhea.   Pt reports decreased appetite.  Past Medical History  Diagnosis Date  . UTI (lower urinary tract infection)     Past Surgical History  Procedure Date  . Cholecystectomy   . Appendectomy   . Endometrial ablation   . Tonsillectomy   . Knee arthroscopy     History reviewed. No pertinent family history.  History  Substance Use Topics  . Smoking status: Former Smoker    Quit date: 08/20/2011  . Smokeless tobacco: Not on file  . Alcohol Use: No    OB History    Grav Para Term Preterm Abortions TAB SAB Ect Mult Living                  Review of Systems  Constitutional: Negative for chills.  Gastrointestinal: Positive for nausea and diarrhea. Negative for vomiting.  All other systems reviewed and are negative.    Allergies  Ibuprofen and Citrus  Home Medications   Current Outpatient Rx  Name Route Sig Dispense Refill  . NITROFURANTOIN MONOHYD MACRO 100 MG PO CAPS Oral Take 1 capsule (100 mg total) by mouth 2 (two) times daily. 10 capsule 0  . TETRAHYDROZOLINE HCL 0.05 % OP SOLN Both Eyes Place 2 drops into both eyes 2 (two) times  daily.    Marland Kitchen PROMETHAZINE HCL 25 MG PO TABS Oral Take 1 tablet (25 mg total) by mouth every 6 (six) hours as needed for nausea. 30 tablet 0    BP 127/69  Pulse 80  Temp(Src) 98.2 F (36.8 C) (Oral)  Resp 18  SpO2 100%  LMP 10/03/2011  Physical Exam  Nursing note and vitals reviewed. Constitutional: She appears well-developed and well-nourished.  HENT:  Head: Normocephalic and atraumatic.  Right Ear: External ear normal.  Eyes: Conjunctivae are normal. Pupils are equal, round, and reactive to light.  Neck: Normal range of motion. Neck supple.  Cardiovascular: Normal rate.   Pulmonary/Chest: Effort normal and breath sounds normal.  Abdominal: Soft. Bowel sounds are normal.  Musculoskeletal: Normal range of motion.  Skin: Skin is warm.  Psychiatric: She has a normal mood and affect.    ED Course  Procedures (including critical care time)  Labs Reviewed  URINALYSIS, ROUTINE W REFLEX MICROSCOPIC - Abnormal; Notable for the following:    APPearance CLOUDY (*)    Ketones, ur 15 (*)    Leukocytes, UA SMALL (*)    All other components within normal limits  URINE MICROSCOPIC-ADD ON - Abnormal; Notable for the following:    Bacteria, UA MANY (*)    All other components within normal limits  PREGNANCY, URINE     1. Diarrhea   2. UTI (  lower urinary tract infection)       MDM  Pt given iv fluids,  immodium x 2.   I sent stool for possible c diff.  Urine sent for culture.  Pt advised she needs to see urology for evaluation of frequent utis.  Cultures pending.    Medical screening examination/treatment/procedure(s) were performed by non-physician practitioner and as supervising physician I was immediately available for consultation/collaboration. Osvaldo Human, M.D.    Langston Masker, [place] 10/21/11 2056  Carleene Cooper III, MD 10/22/11 [phone]

## 2011-10-21 NOTE — ED Notes (Signed)
Pt refuses Zofran, stats "it's pointless and it doesn't help".

## 2011-10-21 NOTE — Discharge Instructions (Signed)
Clostridium Difficile Toxin This is a test which may be done when a patient has diarrhea that lasts for several days, or has abdominal pain, fever, and nausea after antibiotic therapy.  This test looks for the presence of Clostridium difficile (C.diff.) toxin in a stool sample. C.diff. is a germ (bacterium) that is one of the groups of bacteria that are usually in the colon, called "normal flora." If something upsets the growth of the other normal flora, C.diff. may overgrow and disrupt the balance of bacteria in the colon. C. diff. may produce two toxins, A and B. The combination of overgrowth and toxins may cause prolonged diarrhea. The toxins may damage the lining of the colon and lead to colitis.  While some cases of C. diff. diarrhea and colitis do not require treatment, others require specific oral antibiotic therapy. Most patients improve as the normal flora re-establishes itself, but about some may have one or more relapses, with symptoms and detectible toxin levels coming back. PREPARATION FOR TEST There is no special preparation for the test. A fresh stool sample is collected in a sterile container. The sample should not be mixed with urine or water. The stool should be taken to the lab within an hour. It may be refrigerated or frozen and taken to the lab as soon as possible. The container should be labeled with your name and the date and time of the stool collection.  NORMAL FINDINGS Negative Tissue Culture (no toxin identified) Ranges for normal findings may vary among different laboratories and hospitals. You should always check with your doctor after having lab work or other tests done to discuss the meaning of your test results and whether your values are considered within normal limits. MEANING OF TEST  Your caregiver will go over the test results with you and discuss the importance and meaning of your results, as well as treatment options and the need for additional tests if  necessary. OBTAINING THE TEST RESULTS It is your responsibility to obtain your test results. Ask the lab or department performing the test when and how you will get your results. Document Released: 09/15/2004 Document Revised: 05/05/2011 Document Reviewed: 08/01/2008 [facility] Patient Information 2012 [place], [place].Urinary Tract Infection Infections of the urinary tract can start in several places. A bladder infection (cystitis), a kidney infection (pyelonephritis), and a prostate infection (prostatitis) are different types of urinary tract infections (UTIs). They usually get better if treated with medicines (antibiotics) that kill germs. Take all the medicine until it is gone. You or your child may feel better in a few days, but TAKE ALL MEDICINE or the infection may not respond and may become more difficult to treat. HOME CARE INSTRUCTIONS   Drink enough water and fluids to keep the urine clear or pale yellow. Cranberry juice is especially recommended, in addition to large amounts of water.   Avoid caffeine, tea, and carbonated beverages. They tend to irritate the bladder.   Alcohol may irritate the prostate.   Only take over-the-counter or prescription medicines for pain, discomfort, or fever as directed by your caregiver.  To prevent further infections:  Empty the bladder often. Avoid holding urine for long periods of time.   After a bowel movement, women should cleanse from front to back. Use each tissue only once.   Empty the bladder before and after sexual intercourse.  FINDING OUT THE RESULTS OF YOUR TEST Not all test results are available during your visit. If your or your child's test results are not back during  the visit, make an appointment with your caregiver to find out the results. Do not assume everything is normal if you have not heard from your caregiver or the medical facility. It is important for you to follow up on all test results. SEEK MEDICAL CARE IF:   There is  back pain.   Your baby is older than 3 months with a rectal temperature of 100.5 F (38.1 C) or higher for more than 1 day.   Your or your child's problems (symptoms) are no better in 3 days. Return sooner if you or your child is getting worse.  SEEK IMMEDIATE MEDICAL CARE IF:   There is severe back pain or lower abdominal pain.   You or your child develops chills.   You have a fever.   Your baby is older than 3 months with a rectal temperature of 102 F (38.9 C) or higher.   Your baby is 29 months old or younger with a rectal temperature of 100.4 F (38 C) or higher.   There is nausea or vomiting.   There is continued burning or discomfort with urination.  MAKE SURE YOU:   Understand these instructions.   Will watch your condition.   Will get help right away if you are not doing well or get worse.  Document Released: 06/02/2005 Document Revised: 05/05/2011 Document Reviewed: 01/05/2007 [facility] Patient Information 2012 [place], [place].

## 2011-10-21 NOTE — ED Notes (Signed)
Stool documented as collected and specimen actually not collected.

## 2011-10-21 NOTE — ED Notes (Signed)
Pt amb to triage with quick steady gait smiling in nad. Pt reports diarrhea x yesterday. Dx with uti last week, finished up atbx last Monday. Pt started having loose stools yesterday. Denies any abd pain, n/v/ or fevers.

## 2011-10-21 NOTE — ED Notes (Signed)
Stool specimen sent to lab.

## 2011-10-26 LAB — URINE CULTURE

## 2011-10-27 NOTE — ED Notes (Addendum)
+   urine Chart sent to EDP office for review. 

## 2011-10-28 NOTE — ED Notes (Addendum)
Chart indicate patient was prescribed Keflex. If patient didn't have abx rx for  Her UTI,then I recommend Macrobid 100 mg tab po BID x 5 days.patient received  rx for Keflex-ok per Fayrene Helper

## 2011-11-25 ENCOUNTER — Encounter (HOSPITAL_BASED_OUTPATIENT_CLINIC_OR_DEPARTMENT_OTHER): Payer: Self-pay | Admitting: Family Medicine

## 2011-11-25 ENCOUNTER — Emergency Department (HOSPITAL_BASED_OUTPATIENT_CLINIC_OR_DEPARTMENT_OTHER)
Admission: EM | Admit: 2011-11-25 | Discharge: 2011-11-25 | Disposition: A | Discharge status: Home / Self Care | Payer: Self-pay | Attending: Emergency Medicine | Admitting: Emergency Medicine

## 2011-11-25 DIAGNOSIS — M25569 Pain in unspecified knee: Secondary | ICD-10-CM

## 2011-11-25 MED ORDER — IBUPROFEN 800 MG PO TABS: 800.0000 mg | ORAL_TABLET | Freq: Three times a day (TID) | ORAL | Status: AC

## 2011-11-25 MED ORDER — HYDROCODONE-ACETAMINOPHEN 5-325 MG PO TABS: 1.0000 | ORAL_TABLET | Freq: Three times a day (TID) | ORAL | Status: AC | PRN

## 2011-11-25 MED ORDER — IBUPROFEN 800 MG PO TABS
800.0000 mg | ORAL_TABLET | Freq: Once | ORAL | Status: AC
  Administered 2011-11-25: 800 mg via ORAL
  Filled 2011-11-25: qty 1

## 2011-11-25 NOTE — ED Provider Notes (Signed)
History     CSN: [id number]  Arrival date & time 11/25/11  0903   First MD Initiated Contact with Patient 11/25/11 0912      Chief Complaint  Patient presents with  . Knee Pain    (Consider location/radiation/quality/duration/timing/severity/associated sxs/prior treatment) HPI The patient presents with left knee pain.  The pain began suddenly 2 weeks ago, as the patient awkwardly twisted the leg as she got out of bed.  Since onset the pain has been persistent, worse with motion or bending to extreme flexion.  The pain is focally about the lateral aspect and posterior.  The pain is described as pressure-like and sharp.  The pain is mildly improved with ibuprofen and oral narcotics.  There is no distal dysesthesia or weakness.  The patient still is ambulatory.  She denies any particular fall during the initial event.  She denies any other focal complaints. Past Medical History  Diagnosis Date  . UTI (lower urinary tract infection)     Past Surgical History  Procedure Date  . Cholecystectomy   . Appendectomy   . Endometrial ablation   . Tonsillectomy   . Knee arthroscopy     No family history on file.  History  Substance Use Topics  . Smoking status: Current Everyday Smoker    Last Attempt to Quit: 08/20/2011  . Smokeless tobacco: Not on file  . Alcohol Use: No    OB History    Grav Para Term Preterm Abortions TAB SAB Ect Mult Living                  Review of Systems  All other systems reviewed and are negative.    Allergies  Citrus  Home Medications   Current Outpatient Rx  Name Route Sig Dispense Refill  . FLEXERIL PO Oral Take by mouth.    Marland Kitchen HYDROCODONE-ACETAMINOPHEN PO Oral Take by mouth.    . IBUPROFEN PO Oral Take by mouth.    Marland Kitchen HYDROCODONE-ACETAMINOPHEN 5-325 MG PO TABS Oral Take 1 tablet by mouth every 8 (eight) hours as needed for pain. 10 tablet 0  . IBUPROFEN 800 MG PO TABS Oral Take 1 tablet (800 mg total) by mouth every 8 (eight) hours. 12  tablet 0  . TETRAHYDROZOLINE HCL 0.05 % OP SOLN Both Eyes Place 2 drops into both eyes 2 (two) times daily.      BP 147/81  Pulse 99  Temp(Src) 99.1 F (37.3 C) (Oral)  Resp 14  SpO2 100%  LMP 10/28/2011  Physical Exam  Nursing note and vitals reviewed. Constitutional: She is oriented to person, place, and time. She appears well-developed and well-nourished.  HENT:  Head: Normocephalic and atraumatic.  Eyes: Conjunctivae and EOM are normal.  Cardiovascular: Normal rate and regular rhythm.   Pulmonary/Chest: Effort normal and breath sounds normal. No stridor. No respiratory distress.  Musculoskeletal:       Legs: Neurological: She is alert and oriented to person, place, and time. No cranial nerve deficit. She exhibits normal muscle tone. Coordination normal.  Skin: Skin is warm and dry.  Psychiatric: She has a normal mood and affect.    ED Course  Procedures (including critical care time)  Labs Reviewed - No data to display No results found.   1. KNEE PAIN       MDM  This generally well female presents with 2 weeks of left knee pain that began following a twisting mechanism.  On exam the patient has tenderness about the lateral joint line,  and pain with axial loading.  These findings are suggestive of cartilaginous destruction with consideration of sprain.  The patient's preserved ambulatory capacity, benign of other significant focal complaints is reassuring for the stability of the knee.  Patient has an orthopedic surgeon, and was discharged in stable condition to follow up with him.       Gerhard Munch, MD 11/25/11 [phone]

## 2011-11-25 NOTE — Discharge Instructions (Signed)
Your evaluation today is most consistent with either a meniscus injury or knee sprain.  It is very important that you continue to have your condition and evaluated with your orthopedic doctor.  Please make sure to call for the next available appointment.  If you develop any new, or concerning changes in your condition, please return to the emergency department immediately.

## 2011-11-25 NOTE — ED Notes (Signed)
Pt sts she injured left knee by "twisting it" 2 wks ago. Pt taking otc ibuprofen for pain.

## 2013-06-05 ENCOUNTER — Emergency Department (HOSPITAL_COMMUNITY)
Admission: EM | Admit: 2013-06-05 | Discharge: 2013-06-05 | Disposition: A | Discharge status: Home / Self Care | Payer: Self-pay | Attending: Emergency Medicine | Admitting: Emergency Medicine

## 2013-06-05 ENCOUNTER — Encounter (HOSPITAL_COMMUNITY): Payer: Self-pay | Admitting: Emergency Medicine

## 2013-06-05 ENCOUNTER — Emergency Department (HOSPITAL_COMMUNITY): Payer: Self-pay

## 2013-06-05 DIAGNOSIS — R51 Headache: Secondary | ICD-10-CM | POA: Insufficient documentation

## 2013-06-05 DIAGNOSIS — J209 Acute bronchitis, unspecified: Secondary | ICD-10-CM | POA: Insufficient documentation

## 2013-06-05 DIAGNOSIS — Z87440 Personal history of urinary (tract) infections: Secondary | ICD-10-CM | POA: Insufficient documentation

## 2013-06-05 DIAGNOSIS — F17200 Nicotine dependence, unspecified, uncomplicated: Secondary | ICD-10-CM | POA: Insufficient documentation

## 2013-06-05 DIAGNOSIS — IMO0002 Reserved for concepts with insufficient information to code with codable children: Secondary | ICD-10-CM | POA: Insufficient documentation

## 2013-06-05 MED ORDER — ALBUTEROL SULFATE (5 MG/ML) 0.5% IN NEBU
INHALATION_SOLUTION | RESPIRATORY_TRACT | Status: AC
  Administered 2013-06-05: 5 mg
  Filled 2013-06-05: qty 0.5

## 2013-06-05 MED ORDER — ALBUTEROL SULFATE (5 MG/ML) 0.5% IN NEBU: 2.5000 mg | INHALATION_SOLUTION | Freq: Once | RESPIRATORY_TRACT | Status: DC

## 2013-06-05 MED ORDER — ACETAMINOPHEN 325 MG PO TABS
ORAL_TABLET | ORAL | Status: AC
  Filled 2013-06-05: qty 2

## 2013-06-05 MED ORDER — SODIUM CHLORIDE 0.9 % IN NEBU
INHALATION_SOLUTION | RESPIRATORY_TRACT | Status: AC
  Administered 2013-06-05: 14:00:00
  Filled 2013-06-05: qty 12

## 2013-06-05 MED ORDER — IPRATROPIUM BROMIDE 0.02 % IN SOLN
0.5000 mg | Freq: Once | RESPIRATORY_TRACT | Status: AC
  Administered 2013-06-05: 0.5 mg via RESPIRATORY_TRACT

## 2013-06-05 MED ORDER — ALBUTEROL SULFATE HFA 108 (90 BASE) MCG/ACT IN AERS: 1.0000 | INHALATION_SPRAY | Freq: Four times a day (QID) | RESPIRATORY_TRACT | Status: DC | PRN

## 2013-06-05 MED ORDER — ACETAMINOPHEN 325 MG PO TABS
650.0000 mg | ORAL_TABLET | Freq: Once | ORAL | Status: AC
  Administered 2013-06-05: 650 mg via ORAL

## 2013-06-05 MED ORDER — IPRATROPIUM BROMIDE 0.02 % IN SOLN
RESPIRATORY_TRACT | Status: AC
  Filled 2013-06-05: qty 2.5

## 2013-06-05 MED ORDER — ALBUTEROL SULFATE (5 MG/ML) 0.5% IN NEBU
INHALATION_SOLUTION | RESPIRATORY_TRACT | Status: AC
  Filled 2013-06-05: qty 2

## 2013-06-05 MED ORDER — ALBUTEROL SULFATE (5 MG/ML) 0.5% IN NEBU
INHALATION_SOLUTION | RESPIRATORY_TRACT | Status: AC
  Filled 2013-06-05: qty 0.5

## 2013-06-05 MED ORDER — PREDNISONE 20 MG PO TABS: 60.0000 mg | ORAL_TABLET | Freq: Every day | ORAL | Status: DC

## 2013-06-05 MED ORDER — PREDNISONE 20 MG PO TABS
60.0000 mg | ORAL_TABLET | Freq: Once | ORAL | Status: AC
  Administered 2013-06-05: 60 mg via ORAL

## 2013-06-05 MED ORDER — ALBUTEROL SULFATE (5 MG/ML) 0.5% IN NEBU
10.0000 mg | INHALATION_SOLUTION | Freq: Once | RESPIRATORY_TRACT | Status: AC
  Administered 2013-06-05: 10 mg via RESPIRATORY_TRACT

## 2013-06-05 MED ORDER — BENZONATATE 100 MG PO CAPS: 100.0000 mg | ORAL_CAPSULE | Freq: Three times a day (TID) | ORAL | Status: DC

## 2013-06-05 MED ORDER — PREDNISONE 20 MG PO TABS
ORAL_TABLET | ORAL | Status: AC
  Filled 2013-06-05: qty 3

## 2013-06-05 NOTE — ED Notes (Signed)
Patient with SOB since Friday.  Also c/o some nausea, one episode of vomiting, cough, night sweats.  Patient has expiratory wheezes and coughing up brownish yellow sputum.  Patient is a smoker, no history of asthma.

## 2013-06-05 NOTE — ED Provider Notes (Signed)
CSN: [id number]     Arrival date & time 06/05/13  1052 History  First MD Initiated Contact with Patient 06/05/13 1135     Chief Complaint  Patient presents with  . Shortness of Breath    Patient is a 32 y.o. female presenting with shortness of breath. The history is provided by the patient.  Shortness of Breath Severity:  Moderate Onset quality:  Gradual Timing:  Constant Progression:  Worsening Chronicity:  Recurrent Context: activity and URI   Relieved by:  Nothing Worsened by:  Exertion Ineffective treatments:  Inhaler Associated symptoms: cough, fever, headaches and sputum production   Associated symptoms: no chest pain and no vomiting   Risk factors: tobacco use   Risk factors: no family hx of DVT and no hx of PE/DVT     Past Medical History  Diagnosis Date  . UTI (lower urinary tract infection)    Past Surgical History  Procedure Laterality Date  . Cholecystectomy    . Appendectomy    . Endometrial ablation    . Tonsillectomy    . Knee arthroscopy     No family history on file. History  Substance Use Topics  . Smoking status: Current Every Day Smoker    Last Attempt to Quit: 08/20/2011  . Smokeless tobacco: Not on file  . Alcohol Use: No   OB History   Grav Para Term Preterm Abortions TAB SAB Ect Mult Living                 Review of Systems  Constitutional: Positive for fever.  Respiratory: Positive for cough, sputum production and shortness of breath.   Cardiovascular: Negative for chest pain.  Gastrointestinal: Negative for vomiting.  Neurological: Positive for headaches.  All other systems reviewed and are negative.    Allergies  Citrus  Home Medications   Current Outpatient Rx  Name  Route  Sig  Dispense  Refill  . albuterol (PROVENTIL HFA;VENTOLIN HFA) 108 (90 BASE) MCG/ACT inhaler   Inhalation   Inhale 2 puffs into the lungs every 4 (four) hours as needed for wheezing or shortness of breath.         Marland Kitchen DM-Phenylephrine-Acetaminophen  (ALKA-SELTZER PLS SINUS & COUGH PO)   Oral   Take 2 tablets by mouth every 8 (eight) hours as needed (for cold symptoms).         Marland Kitchen albuterol (PROVENTIL HFA;VENTOLIN HFA) 108 (90 BASE) MCG/ACT inhaler   Inhalation   Inhale 1-2 puffs into the lungs every 6 (six) hours as needed for wheezing.   1 Inhaler   0   . predniSONE (DELTASONE) 20 MG tablet   Oral   Take 3 tablets (60 mg total) by mouth daily.   15 tablet   0    BP 130/74  Pulse 71  Temp(Src) 97.9 F (36.6 C) (Oral)  Wt 329 lb (149.233 kg)  BMI 53.13 kg/m2  SpO2 98%  LMP 05/31/2013 Physical Exam  Nursing note and vitals reviewed. Constitutional: She appears well-developed and well-nourished. No distress.  HENT:  Head: Normocephalic and atraumatic.  Right Ear: External ear normal.  Left Ear: External ear normal.  Eyes: Conjunctivae are normal. Right eye exhibits no discharge. Left eye exhibits no discharge. No scleral icterus.  Neck: Neck supple. No tracheal deviation present.  Cardiovascular: Normal rate, regular rhythm and intact distal pulses.   Pulmonary/Chest: Effort normal. No accessory muscle usage or stridor. Not tachypneic. No respiratory distress. She has wheezes in the right upper field, the right  middle field, the right lower field, the left upper field, the left middle field and the left lower field. She has no rales.  Abdominal: Soft. Bowel sounds are normal. She exhibits no distension. There is no tenderness. There is no rebound and no guarding.  Musculoskeletal: She exhibits no edema and no tenderness.  Neurological: She is alert. She has normal strength. No sensory deficit. Cranial nerve deficit:  no gross defecits noted. She exhibits normal muscle tone. She displays no seizure activity. Coordination normal.  Skin: Skin is warm and dry. No rash noted.  Psychiatric: She has a normal mood and affect.    ED Course  Procedures (including critical care time)  Labs Review Labs Reviewed - No data to  display Imaging Review Dg Chest 2 View  06/05/2013   CLINICAL DATA:  Cough for 4 days, fever  EXAM: CHEST  2 VIEW  COMPARISON:  07/20/2010  FINDINGS: Cardiomediastinal silhouette is stable. No acute infiltrate or pleural effusion. No pulmonary edema. Mild perihilar increased bronchial markings without focal consolidation.  IMPRESSION: No acute infiltrate or pulmonary edema. Mild perihilar increased bronchial markings without focal consolidation.   Electronically Signed   By: Natasha Mead   On: 06/05/2013 12:51  1326 Pt is feeling better but still wheezing on exam.  Will give an additional neb treatment 1542 better this time. She is ready for discharge MDM   1. Bronchitis with bronchospasm    Patient appears to have bronchitis with bronchospasm. I counseled her on smoking cessation. I will discharge her home on a course of oral steroids and albuterol inhaler. Recommend she followup with primary-care Dr.    Celene Kras, MD 06/05/13 [phone]

## 2013-06-05 NOTE — Progress Notes (Signed)
[facility] provided pt with a list of primary care resources. Patient stated that she was pending insurance through job.  °

## 2013-06-05 NOTE — ED Notes (Signed)
Patient transported to X-ray 

## 2013-09-27 ENCOUNTER — Emergency Department (HOSPITAL_COMMUNITY)
Admission: EM | Admit: 2013-09-27 | Discharge: 2013-09-27 | Disposition: A | Discharge status: Home / Self Care | Payer: Self-pay | Attending: Emergency Medicine | Admitting: Emergency Medicine

## 2013-09-27 ENCOUNTER — Encounter (HOSPITAL_COMMUNITY): Payer: Self-pay | Admitting: Emergency Medicine

## 2013-09-27 DIAGNOSIS — M7520 Bicipital tendinitis, unspecified shoulder: Secondary | ICD-10-CM

## 2013-09-27 DIAGNOSIS — Z87440 Personal history of urinary (tract) infections: Secondary | ICD-10-CM | POA: Insufficient documentation

## 2013-09-27 DIAGNOSIS — M5412 Radiculopathy, cervical region: Secondary | ICD-10-CM

## 2013-09-27 DIAGNOSIS — F17200 Nicotine dependence, unspecified, uncomplicated: Secondary | ICD-10-CM | POA: Insufficient documentation

## 2013-09-27 MED ORDER — NAPROXEN 500 MG PO TABS: 500.0000 mg | ORAL_TABLET | Freq: Two times a day (BID) | ORAL | Status: DC

## 2013-09-27 MED ORDER — METHOCARBAMOL 500 MG PO TABS: 500.0000 mg | ORAL_TABLET | Freq: Two times a day (BID) | ORAL | Status: DC

## 2013-09-27 NOTE — Discharge Instructions (Signed)
Take naproxen twice daily as directed. Use Robaxin for muscle relaxing, no driving or operating heavy machinery while taking this drug as it may cause drowsiness. Avoid heavy lifting or hard physical activity.  Bicipital Tendonitis Bicipital tendonitis refers to redness, soreness, and swelling (inflammation) or irritation of the bicep tendon. The biceps muscle is located between the elbow and shoulder of the inner arm. The tendon heads, similar to pieces of rope, connect the bicep muscle to the shoulder socket. They are called short head and long head tendons. When tendonitis occurs, the long head tendon is inflamed and swollen, and may be thickened or partially torn.  Bicipital tendonitis can occur with other problems as well, such as arthritis in the shoulder or acromioclavicular joints, tears in the tendons, or other rotator cuff problems.  CAUSES  Overuse of of the arms for overhead activities is the major cause of tendonitis. Many athletes, such as swimmers, baseball players, and tennis players are prone to bicipital tendonitis. Jobs that require manual labor or routine chores, especially chores involving overhead activities can result in overuse and tendonitis. SYMPTOMS Symptoms may include:  Pain in and around the front of the shoulder. Pain may be worse with overhead motion.  Pain or aching that radiates down the arm.  Clicking or shifting sensations in the shoulder. DIAGNOSIS Your caregiver may perform the following:  Physical exam and tests of the biceps and shoulder to observe range of motion, strength, and stability.  X-rays or magnetic resonance imaging (MRI) to confirm the diagnosis. In most common cases, these tests are not necessary. Since other problems may exist in the shoulder or rotator cuff, additional tests may be recommended. TREATMENT Treatment may include the following:  Medications  Your caregiver may prescribe over-the-counter pain relievers.  Steroid  injections, such as cortisone, may be recommended. These may help to reduce inflammation and pain.  Physical Therapy - Your caregiver may recommend gentle exercises with the arm. These can help restore strength and range of motion. They may be done at home or with a physical therapist's supervision and input.  Surgery - Arthroscopic or open surgery sometimes is necessary. Surgery may include:  Reattachment or repair of the tendon at the shoulder socket.  Removal of the damaged section of the tendon.  Anchoring the tendon to a different area of the shoulder (tenodesis). HOME CARE INSTRUCTIONS   Avoid overhead motion of the affected arm or any other motion that causes pain.  Take medication for pain as directed. Do not take these for more than 3 weeks, unless directed to do so by your caregiver.  Ice the affected area for 20 minutes at a time, 3-4 times per day. Place a towel on the skin over the painful area and the ice or cold pack over the towel. Do not place ice directly on the skin.  Perform gentle exercises at home as directed. These will increase strength and flexibility. PREVENTION  Modify your activities as much as possible to protect your arm. A physical therapist or sports medicine physician can help you understand options for safe motion.  Avoid repetitive overhead pulling, lifting, reaching, and throwing until your caregiver tells you it is ok to resume these activities. SEEK MEDICAL CARE IF:  Your pain worsens.  You have difficulty moving the affected arm.  You have trouble performing any of the self-care instructions. MAKE SURE YOU:   Understand these instructions.  Will watch your condition.  Will get help right away if you are not doing well  or get worse. Document Released: 09/25/2010 Document Revised: 11/15/2011 Document Reviewed: 09/25/2010 [facility]ExitCare Patient Information 2014 [place], [place].  Cervical Radiculopathy Cervical radiculopathy happens when a nerve  in the neck is pinched or bruised by a slipped (herniated) disk or by arthritic changes in the bones of the cervical spine. This can occur due to an injury or as part of the normal aging process. Pressure on the cervical nerves can cause pain or numbness that runs from your neck all the way down into your arm and fingers. CAUSES  There are many possible causes, including:  Injury.  Muscle tightness in the neck from overuse.  Swollen, painful joints (arthritis).  Breakdown or degeneration in the bones and joints of the spine (spondylosis) due to aging.  Bone spurs that may develop near the cervical nerves. SYMPTOMS  Symptoms include pain, weakness, or numbness in the affected arm and hand. Pain can be severe or irritating. Symptoms may be worse when extending or turning the neck. DIAGNOSIS  Your caregiver will ask about your symptoms and do a physical exam. He or she may test your strength and reflexes. X-rays, CT scans, and MRI scans may be needed in cases of injury or if the symptoms do not go away after a period of time. Electromyography (EMG) or nerve conduction testing may be done to study how your nerves and muscles are working. TREATMENT  Your caregiver may recommend certain exercises to help relieve your symptoms. Cervical radiculopathy can, and often does, get better with time and treatment. If your problems continue, treatment options may include:  Wearing a soft collar for short periods of time.  Physical therapy to strengthen the neck muscles.  Medicines, such as nonsteroidal anti-inflammatory drugs (NSAIDs), oral corticosteroids, or spinal injections.  Surgery. Different types of surgery may be done depending on the cause of your problems. HOME CARE INSTRUCTIONS   Put ice on the affected area.  Put ice in a plastic bag.  Place a towel between your skin and the bag.  Leave the ice on for 15-20 minutes, 03-04 times a day or as directed by your caregiver.  If ice does  not help, you can try using heat. Take a warm shower or bath, or use a hot water bottle as directed by your caregiver.  You may try a gentle neck and shoulder massage.  Use a flat pillow when you sleep.  Only take over-the-counter or prescription medicines for pain, discomfort, or fever as directed by your caregiver.  If physical therapy was prescribed, follow your caregiver's directions.  If a soft collar was prescribed, use it as directed. SEEK IMMEDIATE MEDICAL CARE IF:   Your pain gets much worse and cannot be controlled with medicines.  You have weakness or numbness in your hand, arm, face, or leg.  You have a high fever or a stiff, rigid neck.  You lose bowel or bladder control (incontinence).  You have trouble with walking, balance, or speaking. MAKE SURE YOU:   Understand these instructions.  Will watch your condition.  Will get help right away if you are not doing well or get worse. Document Released: 05/18/2001 Document Revised: 11/15/2011 Document Reviewed: 04/06/2011 [facility]Exit[facility] Patient Information 2014 [place]ExitCare, [place]LL[place].

## 2013-09-27 NOTE — ED Provider Notes (Signed)
CSN: [id number]     Arrival date & time 09/27/13  1553 History  This chart was scribed for non-physician practitioner, Johnnette Gourd, PA-C, working with [name], MD by Ronal Fear, ED scribe. This patient was seen in room TR05C/TR05C and the patient's care was started at 4:46 PM.     Chief Complaint  Patient presents with  . Arm Pain   (Consider location/radiation/quality/duration/timing/severity/associated sxs/prior Treatment) Patient is a 33 y.o. female presenting with arm pain. The history is provided by the patient. No language interpreter was used.  Arm Pain   HPI Comments: Tiffany Conley is a 33 y.o. female who presents to the Emergency Department complaining of constant sudden onset pain in her left arm and numbness in her fingers for 2x days ago with associated left sided neck pain. She cannot recall injuring the arm, and the pain/numbness is worse throughout the night and first thing in the morning.  Pt works at a call center.    Past Medical History  Diagnosis Date  . UTI (lower urinary tract infection)    Past Surgical History  Procedure Laterality Date  . Cholecystectomy    . Appendectomy    . Endometrial ablation    . Tonsillectomy    . Knee arthroscopy     History reviewed. No pertinent family history. History  Substance Use Topics  . Smoking status: Current Every Day Smoker    Last Attempt to Quit: 08/20/2011  . Smokeless tobacco: Not on file  . Alcohol Use: No   OB History   Grav Para Term Preterm Abortions TAB SAB Ect Mult Living                 Review of Systems  Musculoskeletal: Positive for myalgias and neck pain. Negative for back pain.  Neurological: Positive for numbness. Negative for weakness.  All other systems reviewed and are negative.    Allergies  Ibuprofen; Vicodin; and Citrus  Home Medications   Current Outpatient Rx  Name  Route  Sig  Dispense  Refill  . albuterol (PROVENTIL HFA;VENTOLIN HFA) 108 (90 BASE) MCG/ACT  inhaler   Inhalation   Inhale 2 puffs into the lungs every 4 (four) hours as needed for wheezing or shortness of breath.         Marland Kitchen ibuprofen (ADVIL,MOTRIN) 200 MG tablet   Oral   Take 800 mg by mouth daily as needed for mild pain.         . methocarbamol (ROBAXIN) 500 MG tablet   Oral   Take 1 tablet (500 mg total) by mouth 2 (two) times daily.   15 tablet   0   . naproxen (NAPROSYN) 500 MG tablet   Oral   Take 1 tablet (500 mg total) by mouth 2 (two) times daily.   30 tablet   0    BP 140/38  Pulse 70  Temp(Src) 99 F (37.2 C) (Oral)  Resp 18  SpO2 98%  LMP 09/27/2013 Physical Exam  Nursing note and vitals reviewed. Constitutional: She is oriented to person, place, and time. She appears well-developed and well-nourished. No distress.  HENT:  Head: Normocephalic and atraumatic.  Mouth/Throat: Oropharynx is clear and moist.  Eyes: Conjunctivae and EOM are normal.  Neck: Normal range of motion. Neck supple.  Cardiovascular: Normal rate, regular rhythm and normal heart sounds.   intact distal pulses  Pulmonary/Chest: Effort normal and breath sounds normal. No respiratory distress.  Musculoskeletal: Normal range of motion. She exhibits no edema.  Tender  to palpation in left trapezius; insertion of biceps tendon.  full ROM of left shoulder. positive impingement sign on left.  Neurological: She is alert and oriented to person, place, and time. No sensory deficit.  sensation intact  Skin: Skin is warm and dry.  Psychiatric: She has a normal mood and affect. Her behavior is normal.    ED Course  Procedures (including critical care time).Marland Kitchen.  DIAGNOSTIC STUDIES: Oxygen Saturation is 98% on RA, normal by my interpretation.    COORDINATION OF CARE: 4:49 PM- Pt advised of plan for treatment including ibuprofen, or naproxen, she was advised to heat and ice the area; she will also be given some muscle relaxer's and pt agrees.    Labs Review Labs Reviewed - No data to  display Imaging Review No results found.  EKG Interpretation   None       MDM   1. Cervical radiculopathy   2. Bicipital tendinitis    NSAIDs, robaxin. Conservative measures discussed. Advised f/u with PCP, ortho if no improvement in 1 week. Return precautions given. Patient states understanding of treatment care plan and is agreeable.   I personally performed the services described in this documentation, which was scribed in my presence. The recorded information has been reviewed and is accurate.    Trevor MaceRobyn M Albert, PA-C 09/27/13 1656

## 2013-09-27 NOTE — ED Notes (Signed)
Called pts name several times in lobby with no response

## 2013-09-27 NOTE — ED Notes (Signed)
Pt reports waking up Tuesday with left arm pain and tingling sensation to left hand. Pain increases to arm with movement. Grips are equal, but painful when she grips with left. + radial pulse. No acute distress noted at triage. Reports similar episode in past but it resolved after one week, no new injury.

## 2013-09-28 NOTE — ED Provider Notes (Signed)
Medical screening examination/treatment/procedure(s) were performed by non-physician practitioner and as supervising physician I was immediately available for consultation/collaboration.  EKG Interpretation   None         Joya Gaskinsonald W Braylin Xu, MD 09/28/13 [phone]

## 2014-03-19 ENCOUNTER — Institutional Professional Consult (permissible substitution): Payer: Self-pay | Admitting: Medical

## 2014-08-26 ENCOUNTER — Emergency Department (HOSPITAL_COMMUNITY)
Admission: EM | Admit: 2014-08-26 | Discharge: 2014-08-26 | Disposition: A | Discharge status: Home / Self Care | Payer: Self-pay | Attending: Emergency Medicine | Admitting: Emergency Medicine

## 2014-08-26 ENCOUNTER — Encounter (HOSPITAL_COMMUNITY): Payer: Self-pay | Admitting: *Deleted

## 2014-08-26 DIAGNOSIS — Z79899 Other long term (current) drug therapy: Secondary | ICD-10-CM | POA: Insufficient documentation

## 2014-08-26 DIAGNOSIS — M5441 Lumbago with sciatica, right side: Secondary | ICD-10-CM | POA: Insufficient documentation

## 2014-08-26 DIAGNOSIS — N390 Urinary tract infection, site not specified: Secondary | ICD-10-CM | POA: Insufficient documentation

## 2014-08-26 DIAGNOSIS — Z720 Tobacco use: Secondary | ICD-10-CM | POA: Insufficient documentation

## 2014-08-26 DIAGNOSIS — Z3202 Encounter for pregnancy test, result negative: Secondary | ICD-10-CM | POA: Insufficient documentation

## 2014-08-26 LAB — URINALYSIS, ROUTINE W REFLEX MICROSCOPIC
Bilirubin Urine: NEGATIVE
GLUCOSE, UA: NEGATIVE mg/dL
Ketones, ur: NEGATIVE mg/dL
Nitrite: NEGATIVE
Protein, ur: NEGATIVE mg/dL
SPECIFIC GRAVITY, URINE: 1.019 (ref 1.005–1.030)
UROBILINOGEN UA: 0.2 mg/dL (ref 0.0–1.0)
pH: 6 (ref 5.0–8.0)

## 2014-08-26 LAB — URINE MICROSCOPIC-ADD ON

## 2014-08-26 LAB — PREGNANCY, URINE: PREG TEST UR: NEGATIVE

## 2014-08-26 MED ORDER — SULFAMETHOXAZOLE-TRIMETHOPRIM 800-160 MG PO TABS: 1.0000 | ORAL_TABLET | Freq: Two times a day (BID) | ORAL | Status: AC

## 2014-08-26 MED ORDER — HYDROCODONE-ACETAMINOPHEN 5-325 MG PO TABS: 1.0000 | ORAL_TABLET | ORAL | Status: DC | PRN

## 2014-08-26 MED ORDER — HYDROCODONE-ACETAMINOPHEN 5-325 MG PO TABS
1.0000 | ORAL_TABLET | Freq: Once | ORAL | Status: AC
  Administered 2014-08-26: 1 via ORAL
  Filled 2014-08-26: qty 1

## 2014-08-26 MED ORDER — ONDANSETRON 4 MG PO TBDP
4.0000 mg | ORAL_TABLET | Freq: Once | ORAL | Status: AC
  Administered 2014-08-26: 4 mg via ORAL
  Filled 2014-08-26: qty 1

## 2014-08-26 MED ORDER — TRAMADOL HCL 50 MG PO TABS: 50.0000 mg | ORAL_TABLET | Freq: Four times a day (QID) | ORAL | Status: DC | PRN

## 2014-08-26 MED ORDER — ONDANSETRON HCL 4 MG PO TABS: 4.0000 mg | ORAL_TABLET | Freq: Three times a day (TID) | ORAL | Status: DC | PRN

## 2014-08-26 MED ORDER — SULFAMETHOXAZOLE-TRIMETHOPRIM 800-160 MG PO TABS: 1.0000 | ORAL_TABLET | Freq: Two times a day (BID) | ORAL | Status: DC

## 2014-08-26 NOTE — ED Notes (Signed)
Pt reports hx of back pain, now having pain to right buttock and muscle spasms. Ambulatory at triage, denies urinary symptoms.

## 2014-08-26 NOTE — Discharge Instructions (Signed)
Read the information below.  Use the prescribed medication as directed.  Please discuss all new medications with your pharmacist.  You may return to the Emergency Department at any time for worsening condition or any new symptoms that concern you.   If you develop fevers, loss of control of bowel or bladder, weakness or numbness in your legs, or are unable to walk, return to the ER for a recheck. If you develop high fevers, abdominal pain, uncontrolled vomiting, or are unable to tolerate fluids by mouth, return to the ER for a recheck.

## 2014-08-26 NOTE — ED Provider Notes (Signed)
CSN: [id number]     Arrival date & time 08/26/14  1507 History  This chart was scribed for non-physician practitioner working with Audree CamelScott T Goldston, MD by Elveria Risingimelie Horne, ED Scribe. This patient was seen in room TR09C/TR09C and the patient's care was started at 4:33 PM.   Chief Complaint  Patient presents with  . Back Pain   The history is provided by the patient. No language interpreter was used.   HPI Comments: Tiffany Conley is a 33 y.o. female who presents to the Emergency Department complaining of back pain: pain in her right buttocks, spasming back pain that extends into her right leg. Patient reports history of sciatica resulting from a fall in January 2015. Patient reports resolution with treatment and intermittent tinges since her fall that she describes as fleeting episodes. Patient reports her current flare up began two days ago is similar to her sciatic pain. Patient reports that her pain is worse when sitting and partially alleviated with walking. Patient denies heavy lifting, recent falls, or reinjury. Patient denies relief with Robaxin, Flexeril, and ibuprofen at home. Patient reports pain currently rated at 6/10 and full 10/10 pain when she is experiencing a spasm. Patient denies fever, cold symptoms, abdominal pain, urinary symptoms, vaginal symptoms, change in bowel/bladder habits or incontinence, history of cancer, or IV drug use. Patient has an expired IUD, patient reports periods throughout. Does have frequent UTIs that present with symptoms only of back pain, pt states this feels different.   Past Medical History  Diagnosis Date  . UTI (lower urinary tract infection)    Past Surgical History  Procedure Laterality Date  . Cholecystectomy    . Appendectomy    . Endometrial ablation    . Tonsillectomy    . Knee arthroscopy     History reviewed. No pertinent family history. History  Substance Use Topics  . Smoking status: Current Every Day Smoker    Last Attempt to  Quit: 08/20/2011  . Smokeless tobacco: Not on file  . Alcohol Use: No   OB History    No data available     Review of Systems  Constitutional: Negative for fever and chills.  Genitourinary: Negative for dysuria, urgency, hematuria and vaginal discharge.  Musculoskeletal: Negative for back pain.  Skin: Negative for rash.  Neurological: Negative for weakness and numbness.  All other systems reviewed and are negative.     Allergies  Ibuprofen; Vicodin; and Citrus  Home Medications   Prior to Admission medications   Medication Sig Start Date End Date Taking? Authorizing Provider  albuterol (PROVENTIL HFA;VENTOLIN HFA) 108 (90 BASE) MCG/ACT inhaler Inhale 2 puffs into the lungs every 4 (four) hours as needed for wheezing or shortness of breath.   Yes Historical Provider, MD  ibuprofen (ADVIL,MOTRIN) 200 MG tablet Take 800 mg by mouth daily as needed for mild pain.   Yes Historical Provider, MD  methocarbamol (ROBAXIN) 500 MG tablet Take 1 tablet (500 mg total) by mouth 2 (two) times daily. 09/27/13  Yes Kathrynn Speedobyn M Hess, PA-C   Triage Vitals: BP 128/60 mmHg  Pulse 81  Temp(Src) 98.8 F (37.1 C)  Resp 18  SpO2 94%  LMP 07/26/2014 Physical Exam  Constitutional: She appears well-developed and well-nourished. No distress.  HENT:  Head: Normocephalic and atraumatic.  Neck: Neck supple.  Pulmonary/Chest: Effort normal.  Abdominal: Soft. She exhibits no distension and no mass. There is no tenderness. There is no rebound and no guarding.  Musculoskeletal:  Back:  Spine nontender, no crepitus, or stepoffs. Lower extremities:  Strength 5/5, sensation intact, distal pulses intact.     Neurological: She is alert.  Skin: She is not diaphoretic.  Nursing note and vitals reviewed.   ED Course  Procedures (including critical care time)  COORDINATION OF CARE: 4:33 PM- Discussed treatment plan with patient at bedside and patient agreed to plan.   Labs Review Labs Reviewed   URINALYSIS, ROUTINE W REFLEX MICROSCOPIC - Abnormal; Notable for the following:    APPearance CLOUDY (*)    Hgb urine dipstick MODERATE (*)    Leukocytes, UA LARGE (*)    All other components within normal limits  URINE MICROSCOPIC-ADD ON - Abnormal; Notable for the following:    Squamous Epithelial / LPF FEW (*)    Bacteria, UA MANY (*)    All other components within normal limits  PREGNANCY, URINE    Imaging Review No results found.   EKG Interpretation None      MDM   Final diagnoses:  Right-sided low back pain with right-sided sciatica  UTI (lower urinary tract infection)    Afebrile, nontoxic patient with right low back with occasional radiation into right leg.  No red flags for back pain.  Has tried muscle relaxer and NSAID without improvement.  UA  Positive for infection.  Urine pregnancy negative.   D/C home with norco, zofran, bactrim (pt states she can take norco without nausea if she takes it in small doses, declined tramadol).  PCP follow up.  Discussed result, findings, treatment, and follow up  with patient.  Pt given return precautions.  Pt verbalizes understanding and agrees with plan.       I personally performed the services described in this documentation, which was scribed in my presence. The recorded information has been reviewed and is accurate.    Trixie Dredgemily Kersti Scavone, PA-C 08/26/14 1848  Audree CamelScott T Goldston, MD 08/27/14 [phone]

## 2014-08-26 NOTE — ED Notes (Signed)
Declined W/C at D/C and was escorted to lobby by RN. 

## 2014-09-22 ENCOUNTER — Emergency Department (HOSPITAL_COMMUNITY)
Admission: EM | Admit: 2014-09-22 | Discharge: 2014-09-22 | Disposition: A | Discharge status: Home / Self Care | Payer: Self-pay | Attending: Emergency Medicine | Admitting: Emergency Medicine

## 2014-09-22 ENCOUNTER — Encounter (HOSPITAL_COMMUNITY): Payer: Self-pay

## 2014-09-22 DIAGNOSIS — M545 Low back pain, unspecified: Secondary | ICD-10-CM

## 2014-09-22 DIAGNOSIS — Z720 Tobacco use: Secondary | ICD-10-CM | POA: Insufficient documentation

## 2014-09-22 DIAGNOSIS — Z79899 Other long term (current) drug therapy: Secondary | ICD-10-CM | POA: Insufficient documentation

## 2014-09-22 DIAGNOSIS — Z87440 Personal history of urinary (tract) infections: Secondary | ICD-10-CM | POA: Insufficient documentation

## 2014-09-22 DIAGNOSIS — Z3202 Encounter for pregnancy test, result negative: Secondary | ICD-10-CM | POA: Insufficient documentation

## 2014-09-22 DIAGNOSIS — R3915 Urgency of urination: Secondary | ICD-10-CM | POA: Insufficient documentation

## 2014-09-22 DIAGNOSIS — R350 Frequency of micturition: Secondary | ICD-10-CM | POA: Insufficient documentation

## 2014-09-22 DIAGNOSIS — R300 Dysuria: Secondary | ICD-10-CM | POA: Insufficient documentation

## 2014-09-22 LAB — BASIC METABOLIC PANEL
Anion gap: 9 (ref 5–15)
BUN: 8 mg/dL (ref 6–23)
CALCIUM: 8.8 mg/dL (ref 8.4–10.5)
CHLORIDE: 105 meq/L (ref 96–112)
CO2: 23 mmol/L (ref 19–32)
CREATININE: 0.75 mg/dL (ref 0.50–1.10)
GFR calc Af Amer: >90 mL/min (ref >90)
Glucose, Bld: 90 mg/dL (ref 70–99)
POTASSIUM: 4.1 mmol/L (ref 3.5–5.1)
SODIUM: 137 mmol/L (ref 135–145)

## 2014-09-22 LAB — URINALYSIS, ROUTINE W REFLEX MICROSCOPIC
BILIRUBIN URINE: NEGATIVE
Bilirubin Urine: NEGATIVE
GLUCOSE, UA: NEGATIVE mg/dL
Glucose, UA: NEGATIVE mg/dL
HGB URINE DIPSTICK: NEGATIVE
HGB URINE DIPSTICK: NEGATIVE
Ketones, ur: NEGATIVE mg/dL
Ketones, ur: 15 mg/dL — AB
NITRITE: NEGATIVE
Nitrite: NEGATIVE
Protein, ur: NEGATIVE mg/dL
Protein, ur: NEGATIVE mg/dL
SPECIFIC GRAVITY, URINE: 1.019 (ref 1.005–1.030)
SPECIFIC GRAVITY, URINE: 1.021 (ref 1.005–1.030)
UROBILINOGEN UA: 1 mg/dL (ref 0.0–1.0)
UROBILINOGEN UA: 1 mg/dL (ref 0.0–1.0)
pH: 6 (ref 5.0–8.0)
pH: 6 (ref 5.0–8.0)

## 2014-09-22 LAB — URINE MICROSCOPIC-ADD ON

## 2014-09-22 LAB — PREGNANCY, URINE: PREG TEST UR: NEGATIVE

## 2014-09-22 MED ORDER — ONDANSETRON 4 MG PO TBDP
4.0000 mg | ORAL_TABLET | Freq: Once | ORAL | Status: AC
  Administered 2014-09-22: 4 mg via ORAL
  Filled 2014-09-22: qty 1

## 2014-09-22 MED ORDER — PHENAZOPYRIDINE HCL 95 MG PO TABS: 95.0000 mg | ORAL_TABLET | Freq: Three times a day (TID) | ORAL | Status: DC | PRN

## 2014-09-22 MED ORDER — HYDROCODONE-ACETAMINOPHEN 5-325 MG PO TABS
1.0000 | ORAL_TABLET | Freq: Once | ORAL | Status: AC
  Administered 2014-09-22: 1 via ORAL
  Filled 2014-09-22: qty 1

## 2014-09-22 MED ORDER — CYCLOBENZAPRINE HCL 10 MG PO TABS
10.0000 mg | ORAL_TABLET | Freq: Once | ORAL | Status: AC
  Administered 2014-09-22: 10 mg via ORAL
  Filled 2014-09-22: qty 1

## 2014-09-22 NOTE — ED Notes (Addendum)
Pt reports diagnosed with UTI, states she has taken bactrim and cipro with no relief. Reports urinary frequency and low back pain, states these are the same symptoms that she has been having with the UTI. Reports one episode of vaginal discharge today. Pt. Also reports had IUD taken out on wednesday

## 2014-09-22 NOTE — ED Provider Notes (Signed)
CSN: [id number]     Arrival date & time 09/22/14  1726 History   First MD Initiated Contact with Patient 09/22/14 1808     Chief Complaint  Patient presents with  . Urinary Tract Infection     (Consider location/radiation/quality/duration/timing/severity/associated sxs/prior Treatment) HPI 34 year female with past history as below who presents ED complaining of urinary frequency, dysuria which is been ongoing for the past 3 weeks. Patient states she was initially seen in ED here where she was diagnosed with UTI and prescribed Bactrim. States her symptoms initially improved and then worsened again. States she then was seen at urgent care where she was diagnosed with another UTI and prescribed Cipro. Since then her symptoms initially improved but are now worse again. She denies having any fever but does report having some low right-sided back pain and the location of her sciatica however without pain running down her legs. Patient reports having one episode of nonbilious nonbloody emesis yesterday. She denies having any nausea currently. Denies recent injury to her back. Back pain is rated 8/10. She states it is only present when she needs to urinate however.     Past Medical History  Diagnosis Date  . UTI (lower urinary tract infection)    Past Surgical History  Procedure Laterality Date  . Cholecystectomy    . Appendectomy    . Endometrial ablation    . Tonsillectomy    . Knee arthroscopy     No family history on file. History  Substance Use Topics  . Smoking status: Current Every Day Smoker    Last Attempt to Quit: 08/20/2011  . Smokeless tobacco: Not on file  . Alcohol Use: No   OB History    No data available     Review of Systems  Constitutional: Negative for fever and chills.  HENT: Negative for congestion, rhinorrhea and sore throat.   Eyes: Negative for visual disturbance.  Respiratory: Negative for cough and shortness of breath.   Cardiovascular: Negative for chest  pain, palpitations and leg swelling.  Gastrointestinal: Positive for nausea and vomiting. Negative for abdominal pain, diarrhea and constipation.  Genitourinary: Positive for dysuria, urgency and frequency. Negative for hematuria, vaginal bleeding, vaginal discharge and vaginal pain.  Musculoskeletal: Positive for back pain. Negative for neck pain.  Skin: Negative for rash.  Neurological: Negative for weakness and headaches.  All other systems reviewed and are negative.     Allergies  Ibuprofen; Vicodin; and Citrus  Home Medications   Prior to Admission medications   Medication Sig Start Date End Date Taking? Authorizing Provider  albuterol (PROVENTIL HFA;VENTOLIN HFA) 108 (90 BASE) MCG/ACT inhaler Inhale 2 puffs into the lungs every 4 (four) hours as needed for wheezing or shortness of breath.    Historical Provider, MD  HYDROcodone-acetaminophen (NORCO/VICODIN) 5-325 MG per tablet Take 1 tablet by mouth every 4 (four) hours as needed. 08/26/14   Trixie DredgeEmily West, PA-C  ibuprofen (ADVIL,MOTRIN) 200 MG tablet Take 800 mg by mouth daily as needed for mild pain.    Historical Provider, MD  methocarbamol (ROBAXIN) 500 MG tablet Take 1 tablet (500 mg total) by mouth 2 (two) times daily. 09/27/13   Robyn M Hess, PA-C  ondansetron (ZOFRAN) 4 MG tablet Take 1 tablet (4 mg total) by mouth every 8 (eight) hours as needed for nausea or vomiting. 08/26/14   Trixie DredgeEmily West, PA-C   BP 138/55 mmHg  Pulse 79  Temp(Src) 98.2 F (36.8 C) (Oral)  Resp 20  SpO2 100%  LMP 09/01/2014 Physical Exam  Constitutional: She is oriented to person, place, and time. She appears well-developed and well-nourished. No distress.  Morbidly obese  HENT:  Head: Normocephalic and atraumatic.  Eyes: Conjunctivae are normal.  Neck: Normal range of motion.  Cardiovascular: Normal rate, regular rhythm, normal heart sounds and intact distal pulses.   No murmur heard. Pulmonary/Chest: Effort normal and breath sounds normal. No  respiratory distress. She has no wheezes. She has no rales. She exhibits no tenderness.  Abdominal: Soft. Bowel sounds are normal. She exhibits no distension.  Musculoskeletal: Normal range of motion.       Lumbar back: She exhibits tenderness. She exhibits no bony tenderness.       Back:  Neurological: She is alert and oriented to person, place, and time. No cranial nerve deficit.  Skin: Skin is warm and dry.  Psychiatric: She has a normal mood and affect.  Nursing note and vitals reviewed.   ED Course  Procedures (including critical care time) Labs Review Labs Reviewed  URINALYSIS, ROUTINE W REFLEX MICROSCOPIC - Abnormal; Notable for the following:    Leukocytes, UA SMALL (*)    All other components within normal limits  URINALYSIS, ROUTINE W REFLEX MICROSCOPIC - Abnormal; Notable for the following:    Ketones, ur 15 (*)    Leukocytes, UA SMALL (*)    All other components within normal limits  URINE MICROSCOPIC-ADD ON - Abnormal; Notable for the following:    Squamous Epithelial / LPF FEW (*)    All other components within normal limits  URINE CULTURE  BASIC METABOLIC PANEL  URINE MICROSCOPIC-ADD ON  PREGNANCY, URINE    Imaging Review No results found.   EKG Interpretation None      MDM   Final diagnoses:  None   Tiffany Conley is a 34 y.o. female with H&P as above. -Pertinent historical findings: dysuria, frequency, recent antibiotics. patient also with back pain -Initial impression: HDS, NAD. Benign abd exam. Benign back exam. No red flags. -Ordered: screening labs. Pain meds for back. -Results: no UTI. BMP ok. -Re-evaluation: feels much better. -Disposition: d/c home with pyridium.  Clinical Impression: 1. Dysuria   2. Right-sided low back pain without sciatica     Disposition: Discharge  Condition: Good  I have discussed the results, Dx and Tx plan with the pt(& family if present). He/she/they expressed understanding and agree(s) with the plan.  Discharge instructions discussed at great length. Strict return precautions discussed and pt &/or family have verbalized understanding of the instructions. No further questions at time of discharge.    Discharge Medication List as of 09/22/2014  7:46 PM    START taking these medications   Details  phenazopyridine (PYRIDIUM) 95 MG tablet Take 1 tablet (95 mg total) by mouth 3 (three) times daily as needed for pain., Starting 09/22/2014, Until Discontinued, Print        Follow Up: [facility]     [address] [place] [address] [phone]  To establish primary care, call above   Pt seen in conjunction with Dr. Enos Fling, DO [facility] Emergency Medicine Resident - PGY-2     Ames Dura, MD 09/23/14 [id number]  Audree Camel, MD 09/26/14 [phone]

## 2014-09-23 LAB — URINE CULTURE
Colony Count: NO GROWTH
Culture: NO GROWTH

## 2014-10-01 ENCOUNTER — Telehealth (HOSPITAL_COMMUNITY): Payer: Self-pay | Admitting: *Deleted

## 2014-10-01 NOTE — Telephone Encounter (Signed)
Telephoned patient at home # and left message to return call to [facility] 

## 2014-10-04 ENCOUNTER — Emergency Department (HOSPITAL_COMMUNITY)
Admission: EM | Admit: 2014-10-04 | Discharge: 2014-10-04 | Disposition: A | Discharge status: Home / Self Care | Payer: Self-pay | Attending: Emergency Medicine | Admitting: Emergency Medicine

## 2014-10-04 ENCOUNTER — Encounter (HOSPITAL_COMMUNITY): Payer: Self-pay | Admitting: Emergency Medicine

## 2014-10-04 DIAGNOSIS — Z87440 Personal history of urinary (tract) infections: Secondary | ICD-10-CM | POA: Insufficient documentation

## 2014-10-04 DIAGNOSIS — L0291 Cutaneous abscess, unspecified: Secondary | ICD-10-CM

## 2014-10-04 DIAGNOSIS — Z792 Long term (current) use of antibiotics: Secondary | ICD-10-CM | POA: Insufficient documentation

## 2014-10-04 DIAGNOSIS — L02215 Cutaneous abscess of perineum: Secondary | ICD-10-CM | POA: Insufficient documentation

## 2014-10-04 DIAGNOSIS — Z720 Tobacco use: Secondary | ICD-10-CM | POA: Insufficient documentation

## 2014-10-04 DIAGNOSIS — Z79899 Other long term (current) drug therapy: Secondary | ICD-10-CM | POA: Insufficient documentation

## 2014-10-04 MED ORDER — SULFAMETHOXAZOLE-TRIMETHOPRIM 800-160 MG PO TABS: 1.0000 | ORAL_TABLET | Freq: Two times a day (BID) | ORAL | Status: DC

## 2014-10-04 MED ORDER — HYDROCODONE-ACETAMINOPHEN 5-325 MG PO TABS: ORAL_TABLET | ORAL | Status: DC

## 2014-10-04 MED ORDER — SULFAMETHOXAZOLE-TRIMETHOPRIM 800-160 MG PO TABS
1.0000 | ORAL_TABLET | Freq: Once | ORAL | Status: AC
  Administered 2014-10-04: 1 via ORAL
  Filled 2014-10-04: qty 1

## 2014-10-04 MED ORDER — PROMETHAZINE HCL 25 MG PO TABS: 25.0000 mg | ORAL_TABLET | Freq: Four times a day (QID) | ORAL | Status: DC | PRN

## 2014-10-04 MED ORDER — LIDOCAINE-EPINEPHRINE 2 %-1:100000 IJ SOLN
20.0000 mL | Freq: Once | INTRAMUSCULAR | Status: AC
  Administered 2014-10-04: 20 mL via INTRADERMAL
  Filled 2014-10-04: qty 1

## 2014-10-04 NOTE — ED Notes (Signed)
Pt reports abscess to right labia 1/20 that popped 1/26.

## 2014-10-04 NOTE — ED Provider Notes (Signed)
CSN: [id number]     Arrival date & time 10/04/14  1057 History   First MD Initiated Contact with Patient 10/04/14 1127     Chief Complaint  Patient presents with  . Abscess     (Consider location/radiation/quality/duration/timing/severity/associated sxs/prior Treatment) HPI  Tiffany Conley is a 34 y.o. female complaining of a actively draining abscess to peroneal area which she noticed approximately 9 days ago, states that the abscess opened 4 days ago and has been draining since then, she rates her pain as moderate to severe and exacerbated by movement and palpation. She's been taking over-the-counter medications such as Motrin at home with little relief. She denies fever, chills, nausea, vomiting. Patient states that she has had several abscesses in the past, she denies diabetes.  Past Medical History  Diagnosis Date  . UTI (lower urinary tract infection)    Past Surgical History  Procedure Laterality Date  . Cholecystectomy    . Appendectomy    . Endometrial ablation    . Tonsillectomy    . Knee arthroscopy     No family history on file. History  Substance Use Topics  . Smoking status: Current Every Day Smoker    Last Attempt to Quit: 08/20/2011  . Smokeless tobacco: Not on file  . Alcohol Use: No   OB History    No data available     Review of Systems  10 systems reviewed and found to be negative, except as noted in the HPI.   Allergies  Ibuprofen; Vicodin; and Citrus  Home Medications   Prior to Admission medications   Medication Sig Start Date End Date Taking? Authorizing Provider  ibuprofen (ADVIL,MOTRIN) 200 MG tablet Take 800 mg by mouth daily as needed for moderate pain.    Yes Historical Provider, MD  methocarbamol (ROBAXIN) 500 MG tablet Take 1 tablet (500 mg total) by mouth 2 (two) times daily. Patient taking differently: Take 500 mg by mouth 2 (two) times daily as needed for muscle spasms.  09/27/13  Yes Robyn M Hess, PA-C  phenazopyridine  (PYRIDIUM) 95 MG tablet Take 1 tablet (95 mg total) by mouth 3 (three) times daily as needed for pain. 09/22/14  Yes Ames DuraStephen Balleh, MD  albuterol (PROVENTIL HFA;VENTOLIN HFA) 108 (90 BASE) MCG/ACT inhaler Inhale 2 puffs into the lungs every 4 (four) hours as needed for wheezing or shortness of breath.    Historical Provider, MD  ciprofloxacin (CIPRO) 500 MG tablet Take 500 mg by mouth 2 (two) times daily.    Historical Provider, MD  HYDROcodone-acetaminophen (NORCO/VICODIN) 5-325 MG per tablet Take 1-2 tablets by mouth every 6 hours as needed for pain and/or cough. 10/04/14   Kailash Hinze, PA-C  promethazine (PHENERGAN) 25 MG tablet Take 1 tablet (25 mg total) by mouth every 6 (six) hours as needed for nausea. 10/13/11 10/20/11  Lisette Paz, PA-C  promethazine (PHENERGAN) 25 MG tablet Take 1 tablet (25 mg total) by mouth every 6 (six) hours as needed for nausea or vomiting. 10/04/14   Joni ReiningNicole Ondria Oswald, PA-C  sulfamethoxazole-trimethoprim (SEPTRA DS) 800-160 MG per tablet Take 1 tablet by mouth every 12 (twelve) hours. 10/04/14   Jonah Gingras, PA-C   BP 124/64 mmHg  Pulse 71  Temp(Src) 98.4 F (36.9 C) (Oral)  Resp 18  SpO2 100%  LMP 09/01/2014 Physical Exam  Constitutional: She is oriented to person, place, and time. She appears well-developed and well-nourished. No distress.  HENT:  Head: Normocephalic and atraumatic.  Mouth/Throat: Oropharynx is clear and moist.  Eyes:  Conjunctivae and EOM are normal. Pupils are equal, round, and reactive to light.  Neck: Normal range of motion. Neck supple.  Cardiovascular: Normal rate, regular rhythm and intact distal pulses.   Pulmonary/Chest: Effort normal and breath sounds normal. No stridor. No respiratory distress. She has no wheezes. She has no rales. She exhibits no tenderness.  Abdominal: Soft. Bowel sounds are normal. She exhibits no distension and no mass. There is no tenderness. There is no rebound and no guarding.  Musculoskeletal: Normal  range of motion.  Neurological: She is alert and oriented to person, place, and time.  Skin:  1 cm open abscess actively draining purulent material.  Trace surrounding cellulitis  Psychiatric: She has a normal mood and affect.  Nursing note and vitals reviewed.   ED Course  INCISION AND DRAINAGE Date/Time: 10/04/2014 6:08 PM Performed by: Wynetta Emery Authorized by: Wynetta Emery Consent: Verbal consent obtained. Consent given by: patient Patient identity confirmed: verbally with patient Time out: Immediately prior to procedure a "time out" was called to verify the correct patient, procedure, equipment, support staff and site/side marked as required. Type: abscess Body area: upper extremity Anesthesia: local infiltration Anesthetic total: 5 ml Patient sedated: no Scalpel size: 11 Incision type: single straight Complexity: complex Drainage: purulent and  bloody Drainage amount: scant Wound treatment: wound left open Patient tolerance: Patient tolerated the procedure well with no immediate complications   (including critical care time) Labs Review Labs Reviewed - No data to display  Imaging Review No results found.   EKG Interpretation None      MDM   Final diagnoses:  Abscess    Filed Vitals:   10/04/14 1120 10/04/14 1302  BP: 125/62 124/64  Pulse: 82 71  Temp: 98.7 F (37.1 C) 98.4 F (36.9 C)  TempSrc: Oral Oral  Resp: 19 18  SpO2: 100% 100%    Medications  lidocaine-EPINEPHrine (XYLOCAINE W/EPI) 2 %-1:100000 (with pres) injection 20 mL (20 mLs Intradermal Given by Other 10/04/14 1208)  sulfamethoxazole-trimethoprim (BACTRIM DS,SEPTRA DS) 800-160 MG per tablet 1 tablet (1 tablet Oral Given 10/04/14 1208)    Tiffany Conley is a pleasant 34 y.o. female presenting with openly draining abscess. Abscess open 4 days ago and it continues to drain. She has no signs of systemic infection. The wound is opened and loculations are broken. Patient will  be started on Bactrim. I've asked her to return to the ED in 48 hours for recheck.  Evaluation does not show pathology that would require ongoing emergent intervention or inpatient treatment. Pt is hemodynamically stable and mentating appropriately. Discussed findings and plan with patient/guardian, who agrees with care plan. All questions answered. Return precautions discussed and outpatient follow up given.   Discharge Medication List as of 10/04/2014  1:08 PM    START taking these medications   Details  promethazine (PHENERGAN) 25 MG tablet Take 1 tablet (25 mg total) by mouth every 6 (six) hours as needed for nausea or vomiting., Starting 10/04/2014, Until Discontinued, [employer], PA-C 10/04/14 1809  Elwin Mocha, MD 10/05/14 [phone]

## 2014-10-04 NOTE — Discharge Instructions (Signed)
Return to the emergency room for wound check and packing removal in 48 hours.       If you develop fever, have vomiting or if the swelling and redness starts spreading , return to the emergency room immediately for a recheck.  Take vicodin for breakthrough pain, do not drink alcohol, drive, care for children or do other critical tasks while taking vicodin.  Please follow with your primary care doctor in the next 2 days for a check-up. They must obtain records for further management.   Do not hesitate to return to the Emergency Department for any new, worsening or concerning symptoms.    Abscess An abscess is an infected area that contains a collection of pus and debris.It can occur in almost any part of the body. An abscess is also known as a furuncle or boil. CAUSES  An abscess occurs when tissue gets infected. This can occur from blockage of oil or sweat glands, infection of hair follicles, or a minor injury to the skin. As the body tries to fight the infection, pus collects in the area and creates pressure under the skin. This pressure causes pain. People with weakened immune systems have difficulty fighting infections and get certain abscesses more often.  SYMPTOMS Usually an abscess develops on the skin and becomes a painful mass that is red, warm, and tender. If the abscess forms under the skin, you may feel a moveable soft area under the skin. Some abscesses break open (rupture) on their own, but most will continue to get worse without care. The infection can spread deeper into the body and eventually into the bloodstream, causing you to feel ill.  DIAGNOSIS  Your caregiver will take your medical history and perform a physical exam. A sample of fluid may also be taken from the abscess to determine what is causing your infection. TREATMENT  Your caregiver may prescribe antibiotic medicines to fight the infection. However, taking antibiotics alone usually does not cure an abscess. Your  caregiver may need to make a small cut (incision) in the abscess to drain the pus. In some cases, gauze is packed into the abscess to reduce pain and to continue draining the area. HOME CARE INSTRUCTIONS   Only take over-the-counter or prescription medicines for pain, discomfort, or fever as directed by your caregiver.  If you were prescribed antibiotics, take them as directed. Finish them even if you start to feel better.  If gauze is used, follow your caregiver's directions for changing the gauze.  To avoid spreading the infection:  Keep your draining abscess covered with a bandage.  Wash your hands well.  Do not share personal care items, towels, or whirlpools with others.  Avoid skin contact with others.  Keep your skin and clothes clean around the abscess.  Keep all follow-up appointments as directed by your caregiver. SEEK MEDICAL CARE IF:   You have increased pain, swelling, redness, fluid drainage, or bleeding.  You have muscle aches, chills, or a general ill feeling.  You have a fever. MAKE SURE YOU:   Understand these instructions.  Will watch your condition.  Will get help right away if you are not doing well or get worse. Document Released: 06/02/2005 Document Revised: 02/22/2012 Document Reviewed: 11/05/2011 [facility] Center[facility] Patient Information 2015 [employer], [place]. This information is not intended to replace advice given to you by your health care provider. Make sure you discuss any questions you have with your health care provider.

## 2014-10-04 NOTE — ED Notes (Signed)
Pt states that she has boil/abscess like area on her vaginal area that has been draining for 4 days. Pt states the area around where it is draining pt states very hard. Pt states that she has been washing with water only bc even water hurts. Pt states that she has been on her cycle so been having a "mess down there".

## 2014-10-06 ENCOUNTER — Emergency Department (HOSPITAL_COMMUNITY)
Admission: EM | Admit: 2014-10-06 | Discharge: 2014-10-06 | Disposition: A | Discharge status: Home / Self Care | Payer: Self-pay | Attending: Emergency Medicine | Admitting: Emergency Medicine

## 2014-10-06 ENCOUNTER — Encounter (HOSPITAL_COMMUNITY): Payer: Self-pay | Admitting: Emergency Medicine

## 2014-10-06 DIAGNOSIS — Z4801 Encounter for change or removal of surgical wound dressing: Secondary | ICD-10-CM | POA: Insufficient documentation

## 2014-10-06 DIAGNOSIS — Z87440 Personal history of urinary (tract) infections: Secondary | ICD-10-CM | POA: Insufficient documentation

## 2014-10-06 DIAGNOSIS — Z79899 Other long term (current) drug therapy: Secondary | ICD-10-CM | POA: Insufficient documentation

## 2014-10-06 DIAGNOSIS — Z720 Tobacco use: Secondary | ICD-10-CM | POA: Insufficient documentation

## 2014-10-06 DIAGNOSIS — Z09 Encounter for follow-up examination after completed treatment for conditions other than malignant neoplasm: Secondary | ICD-10-CM

## 2014-10-06 NOTE — ED Notes (Signed)
Per pt, had abscess drained on Friday-here for wound check

## 2014-10-06 NOTE — Discharge Instructions (Signed)
Incision Care °An incision is when a surgeon cuts into your body tissues. After surgery, the incision needs to be cared for properly to prevent infection.  °HOME CARE INSTRUCTIONS  °· Take all medicine as directed by your caregiver. Only take over-the-counter or prescription medicines for pain, discomfort, or fever as directed by your caregiver. °· Do not remove your bandage (dressing) or get your incision wet until your surgeon gives you permission. In the event that your dressing becomes wet, dirty, or starts to smell, change the dressing and call your surgeon for instructions as soon as possible. °· Take showers. Do not take tub baths, swim, or do anything that may soak the wound until it is healed. °· Resume your normal diet and activities as directed or allowed. °· Avoid lifting any weight until you are instructed otherwise. °· Use anti-itch antihistamine medicine as directed by your caregiver. The wound may itch when it is healing. Do not pick or scratch at the wound. °· Follow up with your caregiver for stitch (suture) or staple removal as directed. °· Drink enough fluids to keep your urine clear or pale yellow. °SEEK MEDICAL CARE IF:  °· You have redness, swelling, or increasing pain in the wound that is not controlled with medicine. °· You have drainage, blood, or pus coming from the wound that lasts longer than 1 day. °· You develop muscle aches, chills, or a general ill feeling. °· You notice a bad smell coming from the wound or dressing. °· Your wound edges separate after the sutures, staples, or skin adhesive strips have been removed. °· You develop persistent nausea or vomiting. °SEEK IMMEDIATE MEDICAL CARE IF:  °· You have a fever. °· You develop a rash. °· You develop dizzy episodes or faint while standing. °· You have difficulty breathing. °· You develop any reaction or side effects to medicine given. °MAKE SURE YOU:  °· Understand these instructions. °· Will watch your condition. °· Will get help  right away if you are not doing well or get worse. °Document Released: 03/12/2005 Document Revised: 11/15/2011 Document Reviewed: 10/17/2013 °ExitCare® Patient Information ©2015 [employer]. This information is not intended to replace advice given to you by your health care provider. Make sure you discuss any questions you have with your health care provider. ° ° °

## 2014-10-06 NOTE — ED Provider Notes (Signed)
CSN: [id number]     Arrival date & time 10/06/14  1033 History   First MD Initiated Contact with Patient 10/06/14 1125     Chief Complaint  Patient presents with  . Wound Check     (Consider location/radiation/quality/duration/timing/severity/associated sxs/prior Treatment) Patient is a 34 y.o. female presenting with wound check. The history is provided by the patient. No language interpreter was used.  Wound Check This is a new problem. Episode onset: 3 days. The problem occurs constantly. The problem has been unchanged. Pertinent negatives include no abdominal pain or fever. Nothing aggravates the symptoms. She has tried nothing for the symptoms.    Past Medical History  Diagnosis Date  . UTI (lower urinary tract infection)    Past Surgical History  Procedure Laterality Date  . Cholecystectomy    . Appendectomy    . Endometrial ablation    . Tonsillectomy    . Knee arthroscopy     No family history on file. History  Substance Use Topics  . Smoking status: Current Every Day Smoker    Last Attempt to Quit: 08/20/2011  . Smokeless tobacco: Not on file  . Alcohol Use: No   OB History    No data available     Review of Systems  Constitutional: Negative for fever.  Gastrointestinal: Negative for abdominal pain.  All other systems reviewed and are negative.     Allergies  Ibuprofen; Vicodin; and Citrus  Home Medications   Prior to Admission medications   Medication Sig Start Date End Date Taking? Authorizing Provider  albuterol (PROVENTIL HFA;VENTOLIN HFA) 108 (90 BASE) MCG/ACT inhaler Inhale 2 puffs into the lungs every 4 (four) hours as needed for wheezing or shortness of breath.   Yes Historical Provider, MD  HYDROcodone-acetaminophen (NORCO/VICODIN) 5-325 MG per tablet Take 1-2 tablets by mouth every 6 hours as needed for pain and/or cough. 10/04/14  Yes Nicole Pisciotta, PA-C  ibuprofen (ADVIL,MOTRIN) 200 MG tablet Take 800 mg by mouth daily as needed for  moderate pain.    Yes Historical Provider, MD  methocarbamol (ROBAXIN) 500 MG tablet Take 1 tablet (500 mg total) by mouth 2 (two) times daily. Patient taking differently: Take 500 mg by mouth 2 (two) times daily as needed for muscle spasms.  09/27/13  Yes Robyn M Hess, PA-C  promethazine (PHENERGAN) 25 MG tablet Take 1 tablet (25 mg total) by mouth every 6 (six) hours as needed for nausea. 10/13/11 10/06/14 Yes Lisette Paz, PA-C  sulfamethoxazole-trimethoprim (SEPTRA DS) 800-160 MG per tablet Take 1 tablet by mouth every 12 (twelve) hours. 10/04/14  Yes Nicole Pisciotta, PA-C  phenazopyridine (PYRIDIUM) 95 MG tablet Take 1 tablet (95 mg total) by mouth 3 (three) times daily as needed for pain. Patient not taking: Reported on 10/06/2014 09/22/14   Ames DuraStephen Balleh, MD  promethazine (PHENERGAN) 25 MG tablet Take 1 tablet (25 mg total) by mouth every 6 (six) hours as needed for nausea or vomiting. Patient not taking: Reported on 10/06/2014 10/04/14   Joni ReiningNicole Pisciotta, PA-C   BP 134/53 mmHg  Pulse 79  Temp(Src) 98.3 F (36.8 C) (Oral)  Resp 18  SpO2 100%  LMP 10/01/2014 Physical Exam  Constitutional: She appears well-developed and well-nourished.  Genitourinary:  Open incision no drainage  Musculoskeletal: Normal range of motion.  Neurological: She is alert.  Nursing note and vitals reviewed.   ED Course  Procedures (including critical care time) Labs Review Labs Reviewed - No data to display  Imaging Review No results found.  EKG Interpretation None      MDM   Final diagnoses:  Encounter for recheck of abscess following incision and drainage        Elson Areas, PA-C 10/06/14 1152  Doug Sou, MD 10/06/14 [phone]

## 2014-10-09 ENCOUNTER — Emergency Department (HOSPITAL_COMMUNITY)
Admission: EM | Admit: 2014-10-09 | Discharge: 2014-10-09 | Disposition: A | Discharge status: Home / Self Care | Payer: Self-pay | Attending: Emergency Medicine | Admitting: Emergency Medicine

## 2014-10-09 ENCOUNTER — Emergency Department (HOSPITAL_COMMUNITY): Payer: Self-pay

## 2014-10-09 ENCOUNTER — Encounter (HOSPITAL_COMMUNITY): Payer: Self-pay | Admitting: Emergency Medicine

## 2014-10-09 DIAGNOSIS — R05 Cough: Secondary | ICD-10-CM | POA: Insufficient documentation

## 2014-10-09 DIAGNOSIS — Z3202 Encounter for pregnancy test, result negative: Secondary | ICD-10-CM | POA: Insufficient documentation

## 2014-10-09 DIAGNOSIS — Z87440 Personal history of urinary (tract) infections: Secondary | ICD-10-CM | POA: Insufficient documentation

## 2014-10-09 DIAGNOSIS — R112 Nausea with vomiting, unspecified: Secondary | ICD-10-CM | POA: Insufficient documentation

## 2014-10-09 DIAGNOSIS — Z79899 Other long term (current) drug therapy: Secondary | ICD-10-CM | POA: Insufficient documentation

## 2014-10-09 DIAGNOSIS — R059 Cough, unspecified: Secondary | ICD-10-CM

## 2014-10-09 DIAGNOSIS — Z9049 Acquired absence of other specified parts of digestive tract: Secondary | ICD-10-CM | POA: Insufficient documentation

## 2014-10-09 DIAGNOSIS — Z720 Tobacco use: Secondary | ICD-10-CM | POA: Insufficient documentation

## 2014-10-09 LAB — URINALYSIS, ROUTINE W REFLEX MICROSCOPIC
Bilirubin Urine: NEGATIVE
GLUCOSE, UA: NEGATIVE mg/dL
HGB URINE DIPSTICK: NEGATIVE
Ketones, ur: NEGATIVE mg/dL
Nitrite: NEGATIVE
PH: 6.5 (ref 5.0–8.0)
Protein, ur: NEGATIVE mg/dL
Specific Gravity, Urine: 1.019 (ref 1.005–1.030)
UROBILINOGEN UA: 1 mg/dL (ref 0.0–1.0)

## 2014-10-09 LAB — CBC WITH DIFFERENTIAL/PLATELET
BASOS ABS: 0 10*3/uL (ref 0.0–0.1)
BASOS PCT: 0 % (ref 0–1)
EOS ABS: 0.4 10*3/uL (ref 0.0–0.7)
EOS PCT: 4 % (ref 0–5)
HCT: 42.1 % (ref 36.0–46.0)
HEMOGLOBIN: 14.1 g/dL (ref 12.0–15.0)
LYMPHS ABS: 4 10*3/uL (ref 0.7–4.0)
Lymphocytes Relative: 36 % (ref 12–46)
MCH: 31.3 pg (ref 26.0–34.0)
MCHC: 33.5 g/dL (ref 30.0–36.0)
MCV: 93.3 fL (ref 78.0–100.0)
MONOS PCT: 9 % (ref 3–12)
Monocytes Absolute: 1 10*3/uL (ref 0.1–1.0)
NEUTROS ABS: 5.5 10*3/uL (ref 1.7–7.7)
Neutrophils Relative %: 51 % (ref 43–77)
Platelets: 244 10*3/uL (ref 150–400)
RBC: 4.51 MIL/uL (ref 3.87–5.11)
RDW: 13.3 % (ref 11.5–15.5)
WBC: 10.9 10*3/uL — AB (ref 4.0–10.5)

## 2014-10-09 LAB — BASIC METABOLIC PANEL
ANION GAP: 6 (ref 5–15)
BUN: 12 mg/dL (ref 6–23)
CHLORIDE: 107 mmol/L (ref 96–112)
CO2: 25 mmol/L (ref 19–32)
Calcium: 8.9 mg/dL (ref 8.4–10.5)
Creatinine, Ser: 0.87 mg/dL (ref 0.50–1.10)
GFR calc Af Amer: >90 mL/min (ref >90)
GFR, EST NON AFRICAN AMERICAN: 86 mL/min — AB (ref >90)
GLUCOSE: 94 mg/dL (ref 70–99)
POTASSIUM: 4.1 mmol/L (ref 3.5–5.1)
SODIUM: 138 mmol/L (ref 135–145)

## 2014-10-09 LAB — URINE MICROSCOPIC-ADD ON

## 2014-10-09 LAB — PREGNANCY, URINE: PREG TEST UR: NEGATIVE

## 2014-10-09 MED ORDER — ONDANSETRON 4 MG PO TBDP: 4.0000 mg | ORAL_TABLET | Freq: Three times a day (TID) | ORAL | Status: DC | PRN

## 2014-10-09 MED ORDER — ONDANSETRON 4 MG PO TBDP
4.0000 mg | ORAL_TABLET | Freq: Once | ORAL | Status: AC
  Administered 2014-10-09: 4 mg via ORAL
  Filled 2014-10-09: qty 1

## 2014-10-09 NOTE — ED Notes (Addendum)
Pt reports having  abscess on 1/29 and was seen in ED in which abscess was cut to help drain more. Pt states she still does not feel well and has been having nausea and vomiting along with night sweats w/o fever. Pt states packing fell out on its own and abscess is bleeding. Abscess looks clean and dry.

## 2014-10-09 NOTE — ED Provider Notes (Signed)
CSN: [id number]     Arrival date & time 10/09/14  1911 History   First MD Initiated Contact with Patient 10/09/14 2019     Chief Complaint  Patient presents with  . Nausea  . Emesis  . Abscess check      (Consider location/radiation/quality/duration/timing/severity/associated sxs/prior Treatment) Patient is a 34 y.o. female presenting with vomiting. The history is provided by the patient. No language interpreter was used.  Emesis Severity:  Mild Duration:  2 days Timing:  Constant Number of daily episodes:  1 Able to tolerate:  Liquids Progression:  Worsening Chronicity:  New Recent urination:  Normal Relieved by:  Nothing Ineffective treatments:  None tried Risk factors: not pregnant now   Pt complains of nausea. Pt vomitted yesterday x1.  Pt reports she vomitted once and had diarrhea once today.  Pt reports abscess on i and d on 1/29.  Pt is on antibiotics.  Pt report she has had a cough and congestion.  Pt reports sweating last pm.  Past Medical History  Diagnosis Date  . UTI (lower urinary tract infection)    Past Surgical History  Procedure Laterality Date  . Cholecystectomy    . Appendectomy    . Endometrial ablation    . Tonsillectomy    . Knee arthroscopy     History reviewed. No pertinent family history. History  Substance Use Topics  . Smoking status: Current Every Day Smoker    Last Attempt to Quit: 08/20/2011  . Smokeless tobacco: Not on file  . Alcohol Use: No   OB History    No data available     Review of Systems  Gastrointestinal: Positive for vomiting.  All other systems reviewed and are negative.     Allergies  Ibuprofen; Vicodin; and Citrus  Home Medications   Prior to Admission medications   Medication Sig Start Date End Date Taking? Authorizing Provider  sulfamethoxazole-trimethoprim (SEPTRA DS) 800-160 MG per tablet Take 1 tablet by mouth every 12 (twelve) hours. 10/04/14  Yes Nicole Pisciotta, PA-C  albuterol (PROVENTIL  HFA;VENTOLIN HFA) 108 (90 BASE) MCG/ACT inhaler Inhale 2 puffs into the lungs every 4 (four) hours as needed for wheezing or shortness of breath.    Historical Provider, MD  HYDROcodone-acetaminophen (NORCO/VICODIN) 5-325 MG per tablet Take 1-2 tablets by mouth every 6 hours as needed for pain and/or cough. Patient not taking: Reported on 10/09/2014 10/04/14   Joni ReiningNicole Pisciotta, PA-C  methocarbamol (ROBAXIN) 500 MG tablet Take 1 tablet (500 mg total) by mouth 2 (two) times daily. Patient not taking: Reported on 10/09/2014 09/27/13   Kathrynn Speedobyn M Hess, PA-C  phenazopyridine (PYRIDIUM) 95 MG tablet Take 1 tablet (95 mg total) by mouth 3 (three) times daily as needed for pain. Patient not taking: Reported on 10/06/2014 09/22/14   Ames DuraStephen Balleh, MD  promethazine (PHENERGAN) 25 MG tablet Take 1 tablet (25 mg total) by mouth every 6 (six) hours as needed for nausea. 10/13/11 10/06/14  Lisette Paz, PA-C  promethazine (PHENERGAN) 25 MG tablet Take 1 tablet (25 mg total) by mouth every 6 (six) hours as needed for nausea or vomiting. Patient not taking: Reported on 10/09/2014 10/04/14   Joni ReiningNicole Pisciotta, PA-C   BP 116/83 mmHg  Pulse 77  Temp(Src) 98.3 F (36.8 C) (Oral)  Resp 17  Ht 5\' 6"  (1.676 m)  Wt 348 lb (157.852 kg)  BMI 56.20 kg/m2  SpO2 99%  LMP 10/01/2014 Physical Exam  Constitutional: She is oriented to person, place, and time. She appears well-developed  and well-nourished.  HENT:  Head: Normocephalic.  Eyes: EOM are normal. Pupils are equal, round, and reactive to light.  Neck: Normal range of motion.  Cardiovascular: Normal rate and normal heart sounds.   Pulmonary/Chest: Effort normal.  Abdominal: She exhibits no distension.  Musculoskeletal: Normal range of motion.  Neurological: She is alert and oriented to person, place, and time.  Skin: Skin is warm.  Psychiatric: She has a normal mood and affect.  Nursing note and vitals reviewed.   ED Course  Procedures (including critical care  time) Labs Review Labs Reviewed  URINALYSIS, ROUTINE W REFLEX MICROSCOPIC - Abnormal; Notable for the following:    APPearance CLOUDY (*)    Leukocytes, UA MODERATE (*)    All other components within normal limits  CBC WITH DIFFERENTIAL/PLATELET - Abnormal; Notable for the following:    WBC 10.9 (*)    All other components within normal limits  BASIC METABOLIC PANEL - Abnormal; Notable for the following:    GFR calc non Af Amer 86 (*)    All other components within normal limits  URINE MICROSCOPIC-ADD ON - Abnormal; Notable for the following:    Squamous Epithelial / LPF FEW (*)    Bacteria, UA FEW (*)    Crystals CA OXALATE CRYSTALS (*)    All other components within normal limits  PREGNANCY, URINE    Imaging Review Dg Chest 2 View  10/09/2014   CLINICAL DATA:  Productive cough with nausea and vomiting for 2 weeks.  EXAM: CHEST  2 VIEW  COMPARISON:  Chest radiograph June 05, 2013  FINDINGS: Cardiomediastinal silhouette is unremarkable. The lungs are clear without pleural effusions or focal consolidations. Trachea projects midline and there is no pneumothorax. Soft tissue planes and included osseous structures are non-suspicious. Large body habitus. Surgical clips in the included right abdomen likely reflect cholecystectomy.  IMPRESSION: No acute cardiopulmonary process.   Electronically Signed   By: Awilda Metro   On: 10/09/2014 23:14     EKG Interpretation None      MDM  Pt given po zofran.  Pt is able to tolerate po fluids.   Pt given rx for zofran odt.  Pt is advised to see her MD for recheck.    Final diagnoses:  Cough  Nausea and vomiting, vomiting of unspecified type    Zofran odt  Continue current medications    Elson Areas, PA-C 10/09/14 2327  Candyce Churn III, MD 10/10/14 [phone]

## 2014-10-09 NOTE — Discharge Instructions (Signed)

## 2014-10-12 ENCOUNTER — Encounter (HOSPITAL_COMMUNITY): Payer: Self-pay | Admitting: Emergency Medicine

## 2014-10-12 ENCOUNTER — Emergency Department (HOSPITAL_COMMUNITY)
Admission: EM | Admit: 2014-10-12 | Discharge: 2014-10-12 | Disposition: A | Discharge status: Home / Self Care | Payer: Self-pay | Attending: Emergency Medicine | Admitting: Emergency Medicine

## 2014-10-12 DIAGNOSIS — Z792 Long term (current) use of antibiotics: Secondary | ICD-10-CM | POA: Insufficient documentation

## 2014-10-12 DIAGNOSIS — Z87440 Personal history of urinary (tract) infections: Secondary | ICD-10-CM | POA: Insufficient documentation

## 2014-10-12 DIAGNOSIS — Z79899 Other long term (current) drug therapy: Secondary | ICD-10-CM | POA: Insufficient documentation

## 2014-10-12 DIAGNOSIS — L02211 Cutaneous abscess of abdominal wall: Secondary | ICD-10-CM | POA: Insufficient documentation

## 2014-10-12 DIAGNOSIS — Z720 Tobacco use: Secondary | ICD-10-CM | POA: Insufficient documentation

## 2014-10-12 DIAGNOSIS — L0291 Cutaneous abscess, unspecified: Secondary | ICD-10-CM

## 2014-10-12 MED ORDER — CLINDAMYCIN HCL 150 MG PO CAPS: 150.0000 mg | ORAL_CAPSULE | Freq: Four times a day (QID) | ORAL | Status: DC

## 2014-10-12 MED ORDER — HYDROCODONE-ACETAMINOPHEN 5-325 MG PO TABS: ORAL_TABLET | ORAL | Status: DC

## 2014-10-12 MED ORDER — DOXYCYCLINE HYCLATE 100 MG PO CAPS: 100.0000 mg | ORAL_CAPSULE | Freq: Two times a day (BID) | ORAL | Status: DC

## 2014-10-12 NOTE — ED Notes (Signed)
Pt states that she had vaginal abscess drained on 1/29.  States that she began having NV after but that has since subsided.  Now c/o increased pain to site ("It feels like someone is holding a match to it").

## 2014-10-12 NOTE — Discharge Instructions (Signed)
Abscess °An abscess is an infected area that contains a collection of pus and debris. It can occur in almost any part of the body. An abscess is also known as a furuncle or boil. °CAUSES  °An abscess occurs when tissue gets infected. This can occur from blockage of oil or sweat glands, infection of hair follicles, or a minor injury to the skin. As the body tries to fight the infection, pus collects in the area and creates pressure under the skin. This pressure causes pain. People with weakened immune systems have difficulty fighting infections and get certain abscesses more often.  °SYMPTOMS °Usually an abscess develops on the skin and becomes a painful mass that is red, warm, and tender. If the abscess forms under the skin, you may feel a moveable soft area under the skin. Some abscesses break open (rupture) on their own, but most will continue to get worse without care. The infection can spread deeper into the body and eventually into the bloodstream, causing you to feel ill.  °DIAGNOSIS  °Your caregiver will take your medical history and perform a physical exam. A sample of fluid may also be taken from the abscess to determine what is causing your infection. °TREATMENT  °Your caregiver may prescribe antibiotic medicines to fight the infection. However, taking antibiotics alone usually does not cure an abscess. Your caregiver may need to make a small cut (incision) in the abscess to drain the pus. In some cases, gauze is packed into the abscess to reduce pain and to continue draining the area. °HOME CARE INSTRUCTIONS  °· Only take over-the-counter or prescription medicines for pain, discomfort, or fever as directed by your caregiver. °· If you were prescribed antibiotics, take them as directed. Finish them even if you start to feel better. °· If gauze is used, follow your caregiver's directions for changing the gauze. °· To avoid spreading the infection: °· Keep your draining abscess covered with a  bandage. °· Wash your hands well. °· Do not share personal care items, towels, or whirlpools with others. °· Avoid skin contact with others. °· Keep your skin and clothes clean around the abscess. °· Keep all follow-up appointments as directed by your caregiver. °SEEK MEDICAL CARE IF:  °· You have increased pain, swelling, redness, fluid drainage, or bleeding. °· You have muscle aches, chills, or a general ill feeling. °· You have a fever. °MAKE SURE YOU:  °· Understand these instructions. °· Will watch your condition. °· Will get help right away if you are not doing well or get worse. °Document Released: 06/02/2005 Document Revised: 02/22/2012 Document Reviewed: 11/05/2011 °ExitCare® Patient Information ©2015 [employer]. This information is not intended to replace advice given to you by your health care provider. Make sure you discuss any questions you have with your health care provider. ° °Abscess °Care After °An abscess (also called a boil or furuncle) is an infected area that contains a collection of pus. Signs and symptoms of an abscess include pain, tenderness, redness, or hardness, or you may feel a moveable soft area under your skin. An abscess can occur anywhere in the body. The infection may spread to surrounding tissues causing cellulitis. A cut (incision) by the surgeon was made over your abscess and the pus was drained out. Gauze may have been packed into the space to provide a drain that will allow the cavity to heal from the inside outwards. The boil may be painful for 5 to 7 days. Most people with a boil do not have   high fevers. Your abscess, if seen early, may not have localized, and may not have been lanced. If not, another appointment may be required for this if it does not get better on its own or with medications. °HOME CARE INSTRUCTIONS  °· Only take over-the-counter or prescription medicines for pain, discomfort, or fever as directed by your caregiver. °· When you bathe, soak and then  remove gauze or iodoform packs at least daily or as directed by your caregiver. You may then wash the wound gently with mild soapy water. Repack with gauze or do as your caregiver directs. °SEEK IMMEDIATE MEDICAL CARE IF:  °· You develop increased pain, swelling, redness, drainage, or bleeding in the wound site. °· You develop signs of generalized infection including muscle aches, chills, fever, or a general ill feeling. °· An oral temperature above 102° F (38.9° C) develops, not controlled by medication. °See your caregiver for a recheck if you develop any of the symptoms described above. If medications (antibiotics) were prescribed, take them as directed. °Document Released: 03/11/2005 Document Revised: 11/15/2011 Document Reviewed: 11/06/2007 °ExitCare® Patient Information ©2015 [employer]. This information is not intended to replace advice given to you by your health care provider. Make sure you discuss any questions you have with your health care provider. ° °

## 2014-10-12 NOTE — ED Notes (Signed)
PA is in the room with the patient at this time

## 2014-10-12 NOTE — ED Provider Notes (Signed)
CSN: [id number]     Arrival date & time 10/12/14  1122 History   First MD Initiated Contact with Patient 10/12/14 1142     Chief Complaint  Patient presents with  . Vaginal Pain     (Consider location/radiation/quality/duration/timing/severity/associated sxs/prior Treatment) HPI    PCP: No PCP Per Patient Blood pressure 141/85, pulse 80, temperature 98.5 F (36.9 C), temperature source Oral, resp. rate 18, last menstrual period 10/01/2014, SpO2 100 %.  Tiffany Conley is a 34 y.o.female with a significant PMH of UTI, appendectomy, cholecystectomy, endometrial and a tonsillectomy presents to the ER with complaints of suprapubic pain to abscess wound. She had an abscess drained 1 week ago in the ED, came back for wound check a few days after. She finished her Bactrim yesterday for this. Yesterday when she squat down she felt the wound dehisce. She is having burning pain and is now having puss drainage again. She denies that the pain or firmness associated with the abscess ever went away even with abx. She is here for further evaluation.    Past Medical History  Diagnosis Date  . UTI (lower urinary tract infection)    Past Surgical History  Procedure Laterality Date  . Cholecystectomy    . Appendectomy    . Endometrial ablation    . Tonsillectomy    . Knee arthroscopy     No family history on file. History  Substance Use Topics  . Smoking status: Current Every Day Smoker    Last Attempt to Quit: 08/20/2011  . Smokeless tobacco: Not on file  . Alcohol Use: No   OB History    No data available     Review of Systems 10 Systems reviewed and are negative for acute change except as noted in the HPI.    Allergies  Ibuprofen; Vicodin; and Citrus  Home Medications   Prior to Admission medications   Medication Sig Start Date End Date Taking? Authorizing Provider  albuterol (PROVENTIL HFA;VENTOLIN HFA) 108 (90 BASE) MCG/ACT inhaler Inhale 2 puffs into the lungs every 4  (four) hours as needed for wheezing or shortness of breath.   Yes Historical Provider, MD  ibuprofen (ADVIL,MOTRIN) 200 MG tablet Take 800 mg by mouth every 6 (six) hours as needed for moderate pain.   Yes Historical Provider, MD  ondansetron (ZOFRAN ODT) 4 MG disintegrating tablet Take 1 tablet (4 mg total) by mouth every 8 (eight) hours as needed for nausea or vomiting. 10/09/14  Yes Lonia SkinnerLeslie K Sofia, PA-C  sulfamethoxazole-trimethoprim (SEPTRA DS) 800-160 MG per tablet Take 1 tablet by mouth every 12 (twelve) hours. 10/04/14  Yes Nicole Pisciotta, PA-C  doxycycline (VIBRAMYCIN) 100 MG capsule Take 1 capsule (100 mg total) by mouth 2 (two) times daily. 10/12/14   Dorthula Matasiffany G Verleen Stuckey, PA-C  HYDROcodone-acetaminophen (NORCO/VICODIN) 5-325 MG per tablet Take 1-2 tablets by mouth every 6 hours as needed for pain and/or cough. 10/12/14   Dorthula Matasiffany G Madeleyn Schwimmer, PA-C  methocarbamol (ROBAXIN) 500 MG tablet Take 1 tablet (500 mg total) by mouth 2 (two) times daily. Patient not taking: Reported on 10/09/2014 09/27/13   Kathrynn Speedobyn M Hess, PA-C  phenazopyridine (PYRIDIUM) 95 MG tablet Take 1 tablet (95 mg total) by mouth 3 (three) times daily as needed for pain. Patient not taking: Reported on 10/06/2014 09/22/14   Ames DuraStephen Balleh, MD  promethazine (PHENERGAN) 25 MG tablet Take 1 tablet (25 mg total) by mouth every 6 (six) hours as needed for nausea. 10/13/11 10/06/14  Lisette Paz, PA-C  promethazine (  PHENERGAN) 25 MG tablet Take 1 tablet (25 mg total) by mouth every 6 (six) hours as needed for nausea or vomiting. Patient not taking: Reported on 10/09/2014 10/04/14   Joni Reining Pisciotta, PA-C   BP 141/85 mmHg  Pulse 80  Temp(Src) 98.5 F (36.9 C) (Oral)  Resp 18  SpO2 100%  LMP 10/01/2014 Physical Exam  Constitutional: She appears well-developed and well-nourished. No distress.  HENT:  Head: Normocephalic and atraumatic.  Eyes: Pupils are equal, round, and reactive to light.  Neck: Normal range of motion. Neck supple.  Cardiovascular:  Normal rate and regular rhythm.   Pulmonary/Chest: Effort normal.  Abdominal: Soft.    Neurological: She is alert.  Skin: Skin is warm and dry.  Nursing note and vitals reviewed.   ED Course  Procedures (including critical care time) Labs Review Labs Reviewed  WOUND CULTURE    Imaging Review No results found.   EKG Interpretation None      MDM   Final diagnoses:  Abscess    Took a culture of the abscess, Bactrim did not improve the cellulitis therefore will switch to Clindamycin and send out culture.  No abdominal pain, fevers, vomiting, not a diabetic- denies polyuria or polydipsia.  34 y.o.Tiffany Conley's evaluation in the Emergency Department is complete. It has been determined that no acute conditions requiring further emergency intervention are present at this time. The patient/guardian have been advised of the diagnosis and plan. We have discussed signs and symptoms that warrant return to the ED, such as changes or worsening in symptoms.  Vital signs are stable at discharge. Filed Vitals:   10/12/14 1304  BP: 129/69  Pulse: 76  Temp:   Resp: 18    Patient/guardian has voiced understanding and agreed to follow-up with the PCP or specialist.     Dorthula Matas, PA-C 10/12/14 1310  Derwood Kaplan, MD 10/15/14 1108

## 2014-10-16 LAB — WOUND CULTURE
Gram Stain: NONE SEEN
SPECIAL REQUESTS: NORMAL

## 2014-10-17 ENCOUNTER — Emergency Department (HOSPITAL_COMMUNITY)
Admission: EM | Admit: 2014-10-17 | Discharge: 2014-10-17 | Disposition: A | Discharge status: Home / Self Care | Payer: Self-pay | Attending: Emergency Medicine | Admitting: Emergency Medicine

## 2014-10-17 ENCOUNTER — Telehealth (HOSPITAL_BASED_OUTPATIENT_CLINIC_OR_DEPARTMENT_OTHER): Payer: Self-pay | Admitting: Emergency Medicine

## 2014-10-17 ENCOUNTER — Encounter (HOSPITAL_COMMUNITY): Payer: Self-pay | Admitting: Emergency Medicine

## 2014-10-17 DIAGNOSIS — Z720 Tobacco use: Secondary | ICD-10-CM | POA: Insufficient documentation

## 2014-10-17 DIAGNOSIS — J069 Acute upper respiratory infection, unspecified: Secondary | ICD-10-CM | POA: Insufficient documentation

## 2014-10-17 DIAGNOSIS — Z79899 Other long term (current) drug therapy: Secondary | ICD-10-CM | POA: Insufficient documentation

## 2014-10-17 DIAGNOSIS — K529 Noninfective gastroenteritis and colitis, unspecified: Secondary | ICD-10-CM | POA: Insufficient documentation

## 2014-10-17 DIAGNOSIS — Z792 Long term (current) use of antibiotics: Secondary | ICD-10-CM | POA: Insufficient documentation

## 2014-10-17 DIAGNOSIS — Z4801 Encounter for change or removal of surgical wound dressing: Secondary | ICD-10-CM | POA: Insufficient documentation

## 2014-10-17 DIAGNOSIS — Z9049 Acquired absence of other specified parts of digestive tract: Secondary | ICD-10-CM | POA: Insufficient documentation

## 2014-10-17 DIAGNOSIS — Z5189 Encounter for other specified aftercare: Secondary | ICD-10-CM

## 2014-10-17 NOTE — ED Provider Notes (Signed)
CSN: [id number]     Arrival date & time 10/17/14  1335 History   None    Chief Complaint  Patient presents with  . Nausea  . Generalized Body Aches     (Consider location/radiation/quality/duration/timing/severity/associated sxs/prior Treatment) HPI Comments: Tiffany Conley is a 34 y.o.female with a significant PMH of UTI, appendectomy, cholecystectomy, endometrial and a tonsillectomy presents to the ER with complaints of nausea, vomiting, diarrhea yesterday with generalized body aches. She reports fever of approximately 102 yesterday, resolved no antipyretics prior to arrival. The patient reports multiple episodes of vomiting, nonbloody, 4-5 episodes. She reports multiple episodes of loose stools yesterday, vomiting and diarrhea have resolved. She reports she is able to tolerate fluids at this time has not had any solid intake. She also complains of nasal congestion, sore throat.  Patient also complains of pruritus around mons pubis, after abscess was drained 10/04/2014, has not filled clindamycin. She denies drainage or pain at the site. She does report persistent hardness around the incision site. No overlying erythema.  The history is provided by the patient. No language interpreter was used.    Past Medical History  Diagnosis Date  . UTI (lower urinary tract infection)    Past Surgical History  Procedure Laterality Date  . Cholecystectomy    . Appendectomy    . Endometrial ablation    . Tonsillectomy    . Knee arthroscopy     History reviewed. No pertinent family history. History  Substance Use Topics  . Smoking status: Current Every Day Smoker    Last Attempt to Quit: 08/20/2011  . Smokeless tobacco: Not on file  . Alcohol Use: No   OB History    No data available     Review of Systems  Constitutional: Positive for fever and appetite change.  HENT: Positive for congestion, rhinorrhea and sore throat.   Gastrointestinal: Positive for nausea, vomiting and diarrhea.  Negative for abdominal pain, blood in stool, abdominal distention and anal bleeding.  Genitourinary: Negative for dysuria, urgency, frequency, hematuria, flank pain, vaginal bleeding and vaginal pain.  Skin: Negative for color change.      Allergies  Ibuprofen; Vicodin; and Citrus  Home Medications   Prior to Admission medications   Medication Sig Start Date End Date Taking? Authorizing Provider  albuterol (PROVENTIL HFA;VENTOLIN HFA) 108 (90 BASE) MCG/ACT inhaler Inhale 2 puffs into the lungs every 4 (four) hours as needed for wheezing or shortness of breath.    Historical Provider, MD  clindamycin (CLEOCIN) 150 MG capsule Take 1 capsule (150 mg total) by mouth every 6 (six) hours. 10/12/14   Dorthula Matasiffany G Greene, PA-C  HYDROcodone-acetaminophen (NORCO/VICODIN) 5-325 MG per tablet Take 1-2 tablets by mouth every 6 hours as needed for pain and/or cough. 10/12/14   Dorthula Matasiffany G Greene, PA-C  ibuprofen (ADVIL,MOTRIN) 200 MG tablet Take 800 mg by mouth every 6 (six) hours as needed for moderate pain.    Historical Provider, MD  methocarbamol (ROBAXIN) 500 MG tablet Take 1 tablet (500 mg total) by mouth 2 (two) times daily. Patient not taking: Reported on 10/09/2014 09/27/13   Nada Boozerobyn M Hess, PA-C  ondansetron (ZOFRAN ODT) 4 MG disintegrating tablet Take 1 tablet (4 mg total) by mouth every 8 (eight) hours as needed for nausea or vomiting. 10/09/14   Elson AreasLeslie K Sofia, PA-C  phenazopyridine (PYRIDIUM) 95 MG tablet Take 1 tablet (95 mg total) by mouth 3 (three) times daily as needed for pain. Patient not taking: Reported on 10/06/2014 09/22/14   Jeannett SeniorStephen  Essie Christine, MD  promethazine (PHENERGAN) 25 MG tablet Take 1 tablet (25 mg total) by mouth every 6 (six) hours as needed for nausea. 10/13/11 10/06/14  Lisette Paz, PA-C  promethazine (PHENERGAN) 25 MG tablet Take 1 tablet (25 mg total) by mouth every 6 (six) hours as needed for nausea or vomiting. Patient not taking: Reported on 10/09/2014 10/04/14   Joni Reining Pisciotta, PA-C    sulfamethoxazole-trimethoprim (SEPTRA DS) 800-160 MG per tablet Take 1 tablet by mouth every 12 (twelve) hours. 10/04/14   Nicole Pisciotta, PA-C   BP 139/65 mmHg  Pulse 93  Temp(Src) 98.2 F (36.8 C) (Oral)  Resp 18  SpO2 97%  LMP 10/01/2014 Physical Exam  Constitutional: She is oriented to person, place, and time. She appears well-developed and well-nourished. No distress.  HENT:  Head: Normocephalic and atraumatic.  Nose: Rhinorrhea present.  Eyes: EOM are normal.  Neck: Neck supple.  Cardiovascular: Normal rate and regular rhythm.   Pulmonary/Chest: Effort normal. No respiratory distress. She has no wheezes. She has no rales.  Abdominal: Soft. There is no tenderness. There is no rebound and no guarding.  Neurological: She is alert and oriented to person, place, and time.  Skin: Skin is warm and dry. She is not diaphoretic.     Psychiatric: She has a normal mood and affect. Her behavior is normal.  Nursing note and vitals reviewed.   ED Course  Procedures (including critical care time) Labs Review Labs Reviewed - No data to display  Imaging Review No results found.   EKG Interpretation None      MDM   Final diagnoses:  Gastroenteritis  URI, acute  Wound check, abscess   Patient presents after likely viral GI illness resolved. Patient is tolerating fluids prior to arrival and is tolerating fluids in ED. Patient also here for a wound check of suprapubic abscess. There is no overlying erythema wound appears as though it is healing and no sign of recurrence. Encouraged warm compresses. And following up with a PCP.  Discussed treatment plan with the patient. Return precautions given. Reports understanding and no other concerns at this time.  Patient is stable for discharge at this time.     Mellody Drown, PA-C 10/17/14 1832  Linwood Dibbles, MD 10/17/14 Barry Brunner

## 2014-10-17 NOTE — Telephone Encounter (Signed)
Post ED Visit - Positive Culture Follow-up  Culture report reviewed by antimicrobial stewardship pharmacist: []  Wes Dulaney, Pharm.D., BCPS []  Celedonio MiyamotoJeremy Frens, Pharm.D., BCPS [x]  Georgina PillionElizabeth Martin, Pharm.D., BCPS []  WoodwayMinh Pham, [place]Pharm[place]D., BCPS, AAHIVP []  Estella HuskMichelle Turner, Pharm.D., BCPS, AAHIVP []  Elder CyphersLorie Poole, [address]Pharm[address]D., BCPS  Positive wound culture MRSA Treated with clindamycin, doxycycline, organism sensitive to the same and no further patient follow-up is required at this time.  Berle MullMiller, Drisana Schweickert 10/17/2014, 1:21 PM

## 2014-10-17 NOTE — Discharge Instructions (Signed)
Call for a follow up appointment with a Family or Primary Care Provider.  Return if Symptoms worsen.   Drink plenty of fluids today, start a BRAT diet (bananas, rice, applesauce, toast), advance diet as appetite increases. Take Mucinex DM for your nasal congestion and sore throat. Salt water girl 3-4 times a day. Apply warm compress to your abscess site, return if you have increase in redness, pain at the site.   Emergency Department Resource Guide 1) Find a Doctor and Pay Out of Pocket Although you won't have to find out who is covered by your insurance plan, it is a good idea to ask around and get recommendations. You will then need to call the office and see if the doctor you have chosen will accept you as a new patient and what types of options they offer for patients who are self-pay. Some doctors offer discounts or will set up payment plans for their patients who do not have insurance, but you will need to ask so you aren't surprised when you get to your appointment.  2) Contact Your Local Health Department Not all health departments have doctors that can see patients for sick visits, but many do, so it is worth a call to see if yours does. If you don't know where your local health department is, you can check in your phone book. The CDC also has a tool to help you locate your state's health department, and many state websites also have listings of all of their local health departments.  3) Find a Walk-in Clinic If your illness is not likely to be very severe or complicated, you may want to try a walk in clinic. These are popping up all over the country in pharmacies, drugstores, and shopping centers. They're usually staffed by nurse practitioners or physician assistants that have been trained to treat common illnesses and complaints. They're usually fairly quick and inexpensive. However, if you have serious medical issues or chronic medical problems, these are probably not your best  option.  No Primary Care Doctor: - Call Health Connect at  [phone] - they can help you locate a primary care doctor that  accepts your insurance, provides certain services, etc. - Physician Referral Service- [phone]  Chronic Pain Problems: Organization         Address  Phone   Notes  [facility]  [phone] Patients need to be referred by their primary care doctor.   Medication Assistance: Organization         Address  Phone   Notes  [facility] [address] [place], [place] [address] [phone] --Must be a resident of [facility] [place] -- Must have NO insurance coverage whatsoever (no Medicaid/ Medicare, etc.) -- The pt. MUST have a primary care doctor that directs their care regularly and follows them in the community   MedAssist  [phone]   [employer]  [phone]    Agencies that provide inexpensive medical care: Organization         Address  Phone   Notes  [facility]  [phone]   [facility]    [phone]   [facility] [address] [place], [place] [address] [phone]   [facility] [address], [place] [phone]) [phone]   [facility]anned Parenthood    [phone]   [facility]    [phone]) [phone]   Community  Health and [place]  [address], [place] Phone:  [phone], Fax:  [phone] Hours of Operation:  9 am - 6 pm, M-F.  Also accepts Medicaid/Medicare and self-pay.  [facility] for [address]elaria Aren[address], [address], [place] Phone: [phone], Fax: [phone]. Hours of Operation:  8:30 am - 5:30 pm, M-F.  Also accepts Medicaid and self-pay.  [facility] [address], [place] Phone: [phone]   [place], [place], [place] [phone], Ext. [phone] Mondays & Thursdays: 7-9 AM.  First 15  patients are seen on a first come, first serve basis.    [place] Providers:  Organization         Address  Phone   Notes  [facility] [address] [place] [address], [place] [phone] Also accepts self-pay patients.  [facility] [address], [place]  [phone]   [place], [address], [place] [phone]   [facility] [address] [place] [phone]   Lucianne Lei [address], [place]   [phone] Only accepts [place] Access [place] patients after they have their name applied to their card.   Self-Pay (no insurance) in [facility]:  Organization         Address  Phone   Notes  Sickle Cell Patients, [facility] [place] [phone]   [facility] [place] [phone]   [facility] [place]  [place], [place], [place] [phone]   [facility]/Dr. Osei-Bonsu  [address] [place] or [address], [place] [phone] Phone number for both [place] and [place] locations is the same.  [facility] [address], [place] [phone]   [facility] [address], [place] or [address] [phone]   [facility] [address] [place] [phone], phone; [phone], fax Sees patients 1st and 3rd Saturday of every month.  Must not qualify for public or private insurance (i.e. Medicaid, Medicare, [place] Health Choice, Veterans' Benefits)  Household income should be no more than 200% of the poverty level The clinic cannot treat you if you are pregnant or think you are pregnant  Sexually transmitted diseases are not treated at the clinic.    Dental  Care: Organization         Address  Phone  Notes  [facility] Department of [facility] [place] [phone] Accepts children up to age 21 who are enrolled in [place] or [place]; pregnant women with a Medicaid card; and children who have applied for Medicaid or [place] Health Choice, but were declined, whose parents can pay a reduced fee at time of service.   [facility]  [address], [place] [phone] Accepts children up to age 57 who are enrolled in [place] or [place]; pregnant women with a Medicaid card; and children who have applied for Medicaid or [place] Health Choice, but were declined, whose parents can pay a reduced fee at time of service.  [place] Adult [facility] Access PRO[facility]  [phone]  [place], [place] [phone] Patients are seen by appointment only. Walk-ins are not accepted. [place] will see patients 14 years of age and older. Monday - Tuesday (8am-5pm) Most Wednesdays (8:30-5pm) $30 per visit, cash only  [facility] Adult Dental Access PROGRAM  [address], [facility] [phone] Patients are seen by appointment only. Walk-ins are not accepted. [place] will see patients 31 years of age and older. One Wednesday Evening (Monthly: Volunteer Based).  $30 per visit, cash only  [place]  [phone] for adults; Children under age 82, call Graduate Pediatric Dentistry at [phone]. Children aged 64-14, please call [phone] to request a pediatric application.  Dental services are provided in all areas of dental care including fillings, crowns and bridges, complete and partial dentures, implants, gum treatment, root canals, and extractions. Preventive care is also provided. Treatment is provided to both adults and children. Patients are selected via a lottery and there is often a waiting list.   [facility] [address] [place]  [phone] [email or web address]   [facility] [address] [place], [place] [phone], Ext. [phone] Second and Fourth Thursday of each month, opens at 6:30 AM; Clinic ends at 9 AM.  Patients are seen on a first-come first-served basis, and a limited number are seen during each clinic.   [facility]  [address] [place] Danker [place], [place] [phone]   Eligibility Requirements You must have lived in [place], [place], or [place] counties for at least the last three months.   You cannot be eligible for state or federal sponsored [employer], including [employer], [place], or [employer].   You generally cannot be eligible for healthcare insurance through your employer.    How to apply: Eligibility screenings are held every Tuesday and Wednesday afternoon from 1:00 pm until 4:00 pm. You do not need an appointment for the interview!  [facility] [address] [place], [place]   [place]  [place] Department  [place]  [phone]    Behavioral Health Resources in the Community: Intensive Outpatient Programs Organization         Address  Phone  Notes  Almond Stephen. [address] [place], [place] [phone]   [facility] [address] [place], [place]   ADS: Alcohol & Drug Svcs [address] [place], [place]   [place] [address]  [place], [place] or [phone]   Substance Abuse Resources Organization         Address  Phone  Notes  Alcohol and Drug Services  [phone]   [place]  [phone]   [place]   Chinita Pester  [phone]   Residential & Outpatient Substance Abuse Program  [phone]    Psychological Services Organization         Address  Phone  Notes  [facility] [place]  [place]  [phone]   [place] [address] [place] or [phone]    Mobile Crisis Teams Organization         Address  Phone  Notes  [facility], Mobile Crisis Care Unit  [phone]   [facility]ive Psychotherapeutic Services  [address] [place], [place]   Ivin Booty  [place] [address] [place] [phone]    Self-Help/Support Groups Organization         Address  Phone             Notes  [facility]ental Health Assoc[facility] of [place] - variety of support groups  [phone]- [phone] Call for more information  Narcotics Anonymous (NA), [facility] [address], [employer] [place]  2 meetings at this location   Statistician         Address  Phone  Notes  [facility] [address],    [place]  [phone]   [facility]  [address] [place] [address], [place], [place] [phone]   [facility] [address] [place], [place] [phone] Admissions: 8am-3pm M-F  [facility] [address]    [place], [place] [phone]   [facility] [address] [place], [place], [place] [phone]   [facility] [address],  [place], [place] [phone]   [facility] - Intensive Outpatient [address]urell [address], [place], [place] [phone]   [facility] (Addiction Recovery Care Assoc.) [address] [place].,  [place], [place] [phone] or [phone]   [facility] Services (RTS) [address] [place], [place] [phone] Accepts Medicaid  [place] [address],  [place] [phone] Substance Abuse/Addiction Treatment   [facility] Organization         Address  Phone  Notes  [facility]  [phone]   Angie Fava, PhD [address] Ervin Knack [place], [place]   [phone] or [phone]   [facility] Behavioral   [address] [place], [place] [phone]   [facility] [address] [place], [place] [phone] Insurance/Medicaid/sponsorship through [facility] and Families [address]                                    [place], [place] [phone] Therapy/tele-psych/case  [facility] [address], [place] [phone]    Dr. Lolly Mustache  [phone]   [facility]  United Way [facility] Dept. 1) [address] [place] [address], [place] 2) [address], [place] 3)  [address], [place] [phone]  [phone]   [facility] Child Abuse Hotline [phone] or [phone] (After Hours)

## 2014-10-17 NOTE — ED Notes (Signed)
Pt from home with reports of having abscess lanced on 1/29 and has been having itching, nausea, body aches and fever since. Pt denies any cp or sob, pt denies any vaginal discharge or odor. Suture site noted to have scab and no drainage noted at this time. No cellulitis noted.

## 2014-10-17 NOTE — ED Notes (Signed)
Pt sts nausea and diarrhea; pt sts body aches and sore throat

## 2014-11-10 ENCOUNTER — Emergency Department (HOSPITAL_COMMUNITY)
Admission: EM | Admit: 2014-11-10 | Discharge: 2014-11-10 | Disposition: A | Discharge status: Home / Self Care | Payer: Self-pay | Attending: Emergency Medicine | Admitting: Emergency Medicine

## 2014-11-10 ENCOUNTER — Encounter (HOSPITAL_COMMUNITY): Payer: Self-pay | Admitting: Emergency Medicine

## 2014-11-10 DIAGNOSIS — Z79899 Other long term (current) drug therapy: Secondary | ICD-10-CM | POA: Insufficient documentation

## 2014-11-10 DIAGNOSIS — Z87440 Personal history of urinary (tract) infections: Secondary | ICD-10-CM | POA: Insufficient documentation

## 2014-11-10 DIAGNOSIS — M5416 Radiculopathy, lumbar region: Secondary | ICD-10-CM | POA: Insufficient documentation

## 2014-11-10 DIAGNOSIS — Z720 Tobacco use: Secondary | ICD-10-CM | POA: Insufficient documentation

## 2014-11-10 HISTORY — DX: Sciatica, unspecified side: M54.30

## 2014-11-10 MED ORDER — IBUPROFEN 200 MG PO TABS: 800.0000 mg | ORAL_TABLET | Freq: Four times a day (QID) | ORAL | Status: DC | PRN

## 2014-11-10 MED ORDER — CYCLOBENZAPRINE HCL 5 MG PO TABS: 5.0000 mg | ORAL_TABLET | Freq: Three times a day (TID) | ORAL | Status: DC

## 2014-11-10 MED ORDER — HYDROCODONE-ACETAMINOPHEN 5-325 MG PO TABS: ORAL_TABLET | ORAL | Status: DC

## 2014-11-10 NOTE — ED Notes (Signed)
Pt reports hx of sciatic nerve pain on R side. Pt states she woke up this morning with severe sciatic nerve pain on both sides.

## 2014-11-10 NOTE — Discharge Instructions (Signed)

## 2014-11-10 NOTE — ED Provider Notes (Signed)
CSN: [id number]     Arrival date & time 11/10/14  1953 History  This chart was scribed for non-physician practitioner, Earley Favor, FNP,working with Purvis Sheffield, MD, by Karle Plumber, ED Scribe. This patient was seen in room WTR5/WTR5 and the patient's care was started at 8:47 PM.  Chief Complaint  Patient presents with  . Sciatica   The history is provided by the patient and medical records. No language interpreter was used.    HPI Comments:  Tiffany Conley is a 34 y.o. morbidly obese female with PMHx of sciatic nerve pain who presents to the Emergency Department complaining of severe lower back pain she describes as burning, aching and spasm-like that began approximately ten days ago. Pt states she started having sciatic nerve pain about one year ago secondary to falling. She states that upon waking today she could not walk because she was in so much pain,but over a short period of time the muscles loosened up and she was able ambulate.  Pt states she was seeing Dr. Madelon Lips but will not have insurance until another three weeks. She states she was prescribed Robaxin and did physical therapy. Pt reports that she has not been doing the physical therapy exercises as she used to. She reports taking Ibuprofen and Robaxin earlier today with no significant relief of the pain. Walking and moving makes the pain worse. Denies alleviating factors. Denies dysuria, hematuria, numbness, tingling, weakness of the lower extremities, fever, chills, bowel or bladder incontinence or rash. Past surgical h/o cholecystectomy, appendectomy, endometrial ablation, tonsillectomy, knee arthroscopy and shoulder surgery.   Past Medical History  Diagnosis Date  . UTI (lower urinary tract infection)   . Sciatic nerve pain    Past Surgical History  Procedure Laterality Date  . Cholecystectomy    . Appendectomy    . Endometrial ablation    . Tonsillectomy    . Knee arthroscopy    . Shoulder surgery Left    No  family history on file. History  Substance Use Topics  . Smoking status: Current Every Day Smoker -- 0.50 packs/day    Last Attempt to Quit: 08/20/2011  . Smokeless tobacco: Not on file  . Alcohol Use: No   OB History    No data available     Review of Systems  Constitutional: Negative for fever and chills.  Genitourinary: Negative for dysuria and hematuria.  Musculoskeletal: Positive for back pain.  Neurological: Negative for weakness and numbness.  All other systems reviewed and are negative.   Allergies  Ibuprofen; Vicodin; and Citrus  Home Medications   Prior to Admission medications   Medication Sig Start Date End Date Taking? Authorizing Provider  albuterol (PROVENTIL HFA;VENTOLIN HFA) 108 (90 BASE) MCG/ACT inhaler Inhale 2 puffs into the lungs every 4 (four) hours as needed for wheezing or shortness of breath.    Historical Provider, MD  clindamycin (CLEOCIN) 150 MG capsule Take 1 capsule (150 mg total) by mouth every 6 (six) hours. 10/12/14   Dorthula Matas, PA-C  cyclobenzaprine (FLEXERIL) 5 MG tablet Take 1 tablet (5 mg total) by mouth 3 (three) times daily. 11/10/14   Arman Filter, NP  HYDROcodone-acetaminophen (NORCO/VICODIN) 5-325 MG per tablet Take 1-2 tablets by mouth every 6 hours as needed for pain and/or cough. 11/10/14   Arman Filter, NP  ibuprofen (ADVIL,MOTRIN) 200 MG tablet Take 4 tablets (800 mg total) by mouth every 6 (six) hours as needed for moderate pain. 11/10/14   Arman Filter, NP  methocarbamol (ROBAXIN) 500 MG tablet Take 1 tablet (500 mg total) by mouth 2 (two) times daily. Patient not taking: Reported on 10/09/2014 09/27/13   Nada Boozerobyn M Hess, PA-C  ondansetron (ZOFRAN ODT) 4 MG disintegrating tablet Take 1 tablet (4 mg total) by mouth every 8 (eight) hours as needed for nausea or vomiting. 10/09/14   Elson AreasLeslie K Sofia, PA-C  phenazopyridine (PYRIDIUM) 95 MG tablet Take 1 tablet (95 mg total) by mouth 3 (three) times daily as needed for pain. Patient not taking:  Reported on 10/06/2014 09/22/14   Ames DuraStephen Balleh, MD  promethazine (PHENERGAN) 25 MG tablet Take 1 tablet (25 mg total) by mouth every 6 (six) hours as needed for nausea. 10/13/11 10/06/14  Lisette Paz, PA-C  promethazine (PHENERGAN) 25 MG tablet Take 1 tablet (25 mg total) by mouth every 6 (six) hours as needed for nausea or vomiting. Patient not taking: Reported on 10/09/2014 10/04/14   Joni ReiningNicole Pisciotta, PA-C  sulfamethoxazole-trimethoprim (SEPTRA DS) 800-160 MG per tablet Take 1 tablet by mouth every 12 (twelve) hours. 10/04/14   Nicole Pisciotta, PA-C   Triage Vitals: BP 142/76 mmHg  Pulse 82  Temp(Src) 97.9 F (36.6 C) (Oral)  Resp 18  Ht 5' 6.5" (1.689 m)  Wt 350 lb (158.759 kg)  BMI 55.65 kg/m2  SpO2 98%  LMP 11/03/2014 Physical Exam  Constitutional: She is oriented to person, place, and time. She appears well-developed and well-nourished.  HENT:  Head: Normocephalic and atraumatic.  Eyes: EOM are normal.  Neck: Normal range of motion.  Cardiovascular: Normal rate.   Pulmonary/Chest: Effort normal.  Musculoskeletal: Normal range of motion.  Neurological: She is alert and oriented to person, place, and time.  Skin: Skin is warm and dry.  Psychiatric: She has a normal mood and affect. Her behavior is normal.  Nursing note and vitals reviewed.   ED Course  Procedures (including critical care time) DIAGNOSTIC STUDIES: Oxygen Saturation is 98% on RA, normal by my interpretation.   COORDINATION OF CARE: 8:56 PM- Pt verbalizes understanding and agrees to plan.  Medications - No data to display  Labs Review Labs Reviewed - No data to display  Imaging Review No results found.   EKG Interpretation None     States Flexeril works better for her than Robaxin  MDM   Final diagnoses:  Lumbar radicular syndrome       I personally performed the services described in this documentation, which was scribed in my presence. The recorded information has been reviewed and is  accurate.    Arman FilterGail K Amberli Ruegg, NP 11/10/14 [id number]  Purvis SheffieldForrest Harrison, MD 11/12/14 [phone]

## 2014-11-12 ENCOUNTER — Telehealth (HOSPITAL_COMMUNITY): Payer: Self-pay | Admitting: *Deleted

## 2014-11-12 NOTE — Telephone Encounter (Signed)
Telephoned patient at home # and left message to return call to [facility] 

## 2014-11-15 ENCOUNTER — Telehealth (HOSPITAL_COMMUNITY): Payer: Self-pay | Admitting: *Deleted

## 2014-11-15 NOTE — Telephone Encounter (Signed)
Telephoned patient at home # and left message to return call to [facility] 

## 2014-11-26 ENCOUNTER — Encounter (HOSPITAL_COMMUNITY): Payer: Self-pay | Admitting: *Deleted

## 2014-11-26 ENCOUNTER — Telehealth (HOSPITAL_COMMUNITY): Payer: Self-pay | Admitting: *Deleted

## 2014-11-26 NOTE — Telephone Encounter (Signed)
Telephoned patient at home # and left message to return call to [facility] 

## 2014-12-12 ENCOUNTER — Encounter (HOSPITAL_COMMUNITY): Payer: Self-pay

## 2014-12-12 ENCOUNTER — Ambulatory Visit (HOSPITAL_COMMUNITY)
Admission: RE | Admit: 2014-12-12 | Discharge: 2014-12-12 | Disposition: A | Discharge status: Home / Self Care | Payer: Self-pay | Source: Ambulatory Visit | Attending: Obstetrics and Gynecology | Admitting: Obstetrics and Gynecology

## 2014-12-12 ENCOUNTER — Encounter (HOSPITAL_COMMUNITY): Payer: Self-pay | Admitting: *Deleted

## 2014-12-12 VITALS — BP 116/72 | Temp 98.1°F | Ht 66.5 in | Wt 353.0 lb

## 2014-12-12 DIAGNOSIS — Z1239 Encounter for other screening for malignant neoplasm of breast: Secondary | ICD-10-CM

## 2014-12-12 DIAGNOSIS — R87612 Low grade squamous intraepithelial lesion on cytologic smear of cervix (LGSIL): Secondary | ICD-10-CM

## 2014-12-12 NOTE — Addendum Note (Signed)
Encounter addended by: Priscille Heidelberghristine P Kenitra Leventhal, RN on: 12/12/2014  1:53 PM<BR>     Documentation filed: Patient Instructions Section, Notes Section

## 2014-12-12 NOTE — Patient Instructions (Addendum)
Education materials on self-breast awareness given. Explained to Tiffany Conley the colposcopy that is needed for follow-up of abnormal Pap smear on 09/18/2014. [facility] for a colposcopy to follow-up for abnormal Pap smear. Appointment scheduled for Monday, December 30, 2014 at 1515. Patient aware of appointment and will be there. Informed patient a screening mammogram is recommended at age 34 unless clinically indicated prior. Smoking cessation discussed with patient. Referred patient to the [facility] Quit[facility] and gave resources to free smoking cessation classes at the [facility]. Tiffany Conley verbalized understanding.  Monterio Bob, Kathaleen Maserhristine Poll, RN 1:46 PM

## 2014-12-12 NOTE — Progress Notes (Addendum)
Patient [facility] to [facility] by [facility] due to recommending a colposcopy to follow-up for abnormal Pap smear on 09/18/2014.  Pap Smear:  Pap smear not completed today. Last Pap smear was [facility]2014 [facility] and LGSIL Referred patient to the [facility] for a colposcopy to follow-up for abnormal Pap smear. Appointment scheduled for Monday, December 30, 2014 at 1515. Per patient has a history of an abnormal Pap smear 5-6 years ago that required a repeat Pap smear for follow-up that was normal. Last Pap smear result is scanned into EPIC under media.  Physical exam: Breasts Breasts symmetrical. No skin abnormalities bilateral breasts. No nipple retraction bilateral breasts. No nipple discharge bilateral breasts. No lymphadenopathy. No lumps palpated bilateral breasts. No complaints of pain or tenderness on exam. Screening mammogram recommended at age 34 unless clinically indicated prior.  Pelvic/Bimanual No Pap smear completed today since last Pap smear was 09/18/2014. Pap smear not indicated per BCCCP guidelines.   Smoking cessation discussed with patient. Referred patient to the [facility] Quitline and gave resources to free smoking cessation classes at the [facility].

## 2014-12-30 ENCOUNTER — Ambulatory Visit (INDEPENDENT_AMBULATORY_CARE_PROVIDER_SITE_OTHER): Payer: Self-pay | Admitting: Obstetrics & Gynecology

## 2014-12-30 ENCOUNTER — Encounter: Payer: Self-pay | Admitting: Obstetrics & Gynecology

## 2014-12-30 ENCOUNTER — Other Ambulatory Visit (HOSPITAL_COMMUNITY)
Admission: RE | Admit: 2014-12-30 | Discharge: 2014-12-30 | Disposition: A | Discharge status: Home / Self Care | Payer: Self-pay | Source: Ambulatory Visit | Attending: Obstetrics & Gynecology | Admitting: Obstetrics & Gynecology

## 2014-12-30 VITALS — BP 114/68 | HR 94 | Temp 99.3°F | Ht 66.5 in | Wt 352.6 lb

## 2014-12-30 DIAGNOSIS — R896 Abnormal cytological findings in specimens from other organs, systems and tissues: Secondary | ICD-10-CM

## 2014-12-30 DIAGNOSIS — N390 Urinary tract infection, site not specified: Secondary | ICD-10-CM

## 2014-12-30 DIAGNOSIS — IMO0002 Reserved for concepts with insufficient information to code with codable children: Secondary | ICD-10-CM

## 2014-12-30 LAB — POCT URINALYSIS DIP (DEVICE)
Bilirubin Urine: NEGATIVE
Glucose, UA: NEGATIVE mg/dL
Ketones, ur: NEGATIVE mg/dL
NITRITE: NEGATIVE
Protein, ur: NEGATIVE mg/dL
SPECIFIC GRAVITY, URINE: 1.02 (ref 1.005–1.030)
Urobilinogen, UA: 0.2 mg/dL (ref 0.0–1.0)
pH: 6.5 (ref 5.0–8.0)

## 2014-12-30 LAB — POCT PREGNANCY, URINE: Preg Test, Ur: NEGATIVE

## 2014-12-30 MED ORDER — CIPROFLOXACIN HCL 500 MG PO TABS: 500.0000 mg | ORAL_TABLET | Freq: Two times a day (BID) | ORAL | Status: DC

## 2014-12-30 NOTE — Progress Notes (Signed)
   Subjective:    Patient ID: Tiffany HoughLashara Conley, female    DOB: 04/05/1981, 34 y.o.   MRN: [id number]  HPI  34 yo SAA P0 here for a colpo due to a LGSIL pap recently.  Review of Systems She has been abstinent for about 2 years.    Objective:   Physical Exam  Pleasant morbidly obese BF NAD  Her colposcopy was adequate. I saw some area of punctation at the 11 o'clock position. I biopsied that area and did a ECC. She tolerated the procedure well.      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 UTI- culture urine and treat with cipro

## 2015-01-04 LAB — URINE CULTURE

## 2015-01-16 ENCOUNTER — Telehealth: Payer: Self-pay | Admitting: General Practice

## 2015-01-16 DIAGNOSIS — N390 Urinary tract infection, site not specified: Secondary | ICD-10-CM

## 2015-01-16 MED ORDER — AMPICILLIN 500 MG PO CAPS: 500.0000 mg | ORAL_CAPSULE | Freq: Three times a day (TID) | ORAL | Status: DC

## 2015-01-16 NOTE — Telephone Encounter (Signed)
Patient called and left message stating she is calling for the results of her biopsy and to get another prescription for the cipro because she is still having pain and symptoms. She may be at work so if she doesn't answer we can leave information on her voicemail. Had Dr Erin FullingHarraway-Smith review results and urine. Dr Erin FullingHarraway Smith advised follow up pap in a year and Rx for ampicillin 500mg  TID x7. Called patient and informed her of colpo results and need for pap in one year with HPV testing. Patient verbalized understanding and states so I don't need the cryo then. Told patient no just a pap in one year. Also informed patient of different antibiotic sent to pharmacy to clear infection. Patient verbalized understanding and had no other questions

## 2015-01-30 ENCOUNTER — Encounter (HOSPITAL_COMMUNITY): Payer: Self-pay | Admitting: Emergency Medicine

## 2015-01-30 ENCOUNTER — Emergency Department (HOSPITAL_COMMUNITY): Payer: Self-pay

## 2015-01-30 ENCOUNTER — Emergency Department (HOSPITAL_COMMUNITY)
Admission: EM | Admit: 2015-01-30 | Discharge: 2015-01-30 | Disposition: A | Discharge status: Home / Self Care | Payer: Self-pay | Attending: Emergency Medicine | Admitting: Emergency Medicine

## 2015-01-30 DIAGNOSIS — Z79899 Other long term (current) drug therapy: Secondary | ICD-10-CM | POA: Insufficient documentation

## 2015-01-30 DIAGNOSIS — Z7952 Long term (current) use of systemic steroids: Secondary | ICD-10-CM | POA: Insufficient documentation

## 2015-01-30 DIAGNOSIS — B9789 Other viral agents as the cause of diseases classified elsewhere: Secondary | ICD-10-CM

## 2015-01-30 DIAGNOSIS — J45909 Unspecified asthma, uncomplicated: Secondary | ICD-10-CM | POA: Insufficient documentation

## 2015-01-30 DIAGNOSIS — Z87440 Personal history of urinary (tract) infections: Secondary | ICD-10-CM | POA: Insufficient documentation

## 2015-01-30 DIAGNOSIS — Z720 Tobacco use: Secondary | ICD-10-CM | POA: Insufficient documentation

## 2015-01-30 DIAGNOSIS — R062 Wheezing: Secondary | ICD-10-CM

## 2015-01-30 DIAGNOSIS — J069 Acute upper respiratory infection, unspecified: Secondary | ICD-10-CM | POA: Insufficient documentation

## 2015-01-30 DIAGNOSIS — Z792 Long term (current) use of antibiotics: Secondary | ICD-10-CM | POA: Insufficient documentation

## 2015-01-30 LAB — BASIC METABOLIC PANEL
Anion gap: 7 (ref 5–15)
BUN: 7 mg/dL (ref 6–20)
CALCIUM: 8.8 mg/dL — AB (ref 8.9–10.3)
CO2: 22 mmol/L (ref 22–32)
CREATININE: 0.72 mg/dL (ref 0.44–1.00)
Chloride: 107 mmol/L (ref 101–111)
GFR calc Af Amer: >60 mL/min (ref >60)
GFR calc non Af Amer: >60 mL/min (ref >60)
GLUCOSE: 106 mg/dL — AB (ref 65–99)
Potassium: 3.8 mmol/L (ref 3.5–5.1)
SODIUM: 136 mmol/L (ref 135–145)

## 2015-01-30 LAB — CBC
HEMATOCRIT: 45 % (ref 36.0–46.0)
Hemoglobin: 15 g/dL (ref 12.0–15.0)
MCH: 30.1 pg (ref 26.0–34.0)
MCHC: 33.3 g/dL (ref 30.0–36.0)
MCV: 90.2 fL (ref 78.0–100.0)
Platelets: 227 10*3/uL (ref 150–400)
RBC: 4.99 MIL/uL (ref 3.87–5.11)
RDW: 13.3 % (ref 11.5–15.5)
WBC: 11.3 10*3/uL — ABNORMAL HIGH (ref 4.0–10.5)

## 2015-01-30 LAB — BRAIN NATRIURETIC PEPTIDE: B NATRIURETIC PEPTIDE 5: 8.9 pg/mL (ref 0.0–100.0)

## 2015-01-30 MED ORDER — DEXAMETHASONE 4 MG PO TABS
10.0000 mg | ORAL_TABLET | Freq: Once | ORAL | Status: AC
  Administered 2015-01-30: 10 mg via ORAL
  Filled 2015-01-30: qty 3

## 2015-01-30 MED ORDER — ALBUTEROL (5 MG/ML) CONTINUOUS INHALATION SOLN
10.0000 mg/h | INHALATION_SOLUTION | Freq: Once | RESPIRATORY_TRACT | Status: AC
  Administered 2015-01-30: 10 mg/h via RESPIRATORY_TRACT
  Filled 2015-01-30: qty 20

## 2015-01-30 MED ORDER — BENZONATATE 100 MG PO CAPS: 100.0000 mg | ORAL_CAPSULE | Freq: Three times a day (TID) | ORAL | Status: DC | PRN

## 2015-01-30 MED ORDER — IPRATROPIUM-ALBUTEROL 0.5-2.5 (3) MG/3ML IN SOLN
3.0000 mL | Freq: Once | RESPIRATORY_TRACT | Status: AC
  Administered 2015-01-30: 3 mL via RESPIRATORY_TRACT
  Filled 2015-01-30: qty 3

## 2015-01-30 MED ORDER — ALBUTEROL SULFATE HFA 108 (90 BASE) MCG/ACT IN AERS: 2.0000 | INHALATION_SPRAY | RESPIRATORY_TRACT | Status: DC | PRN

## 2015-01-30 MED ORDER — PREDNISONE 50 MG PO TABS: ORAL_TABLET | ORAL | Status: DC

## 2015-01-30 NOTE — ED Provider Notes (Signed)
CSN: [id number]     Arrival date & time 01/30/15  1046 History   First MD Initiated Contact with Patient 01/30/15 1104     Chief Complaint  Patient presents with  . Shortness of Breath  . flu like symptoms      (Consider location/radiation/quality/duration/timing/severity/associated sxs/prior Treatment) HPI Comments: 34 year old female history of childhood asthma comes in with chief complaint of cough shortness of breath. Patient reports having 2 days worth of gradual worsening cough. She also reports a occasional green sputum. Denies fevers at home. Has a history of asthma as a child but has not used inhalers in approximately 10 years. Patient is a smoker.  Patient is a 34 y.o. female presenting with shortness of breath.  Shortness of Breath Severity:  Moderate Onset quality:  Gradual Duration:  2 days Timing:  Constant Progression:  Worsening Chronicity:  New Relieved by:  Nothing Worsened by:  Nothing tried Associated symptoms: cough   Associated symptoms: no abdominal pain, no chest pain, no fever, no headaches and no rash     Past Medical History  Diagnosis Date  . UTI (lower urinary tract infection)   . Sciatic nerve pain    Past Surgical History  Procedure Laterality Date  . Cholecystectomy    . Appendectomy    . Endometrial ablation    . Tonsillectomy    . Knee arthroscopy    . Shoulder surgery Left    Family History  Problem Relation Age of Onset  . Cancer Maternal Grandmother     colon   History  Substance Use Topics  . Smoking status: Current Every Day Smoker -- 0.50 packs/day  . Smokeless tobacco: Not on file  . Alcohol Use: No   OB History    Gravida Para Term Preterm AB TAB SAB Ectopic Multiple Living   0 0 0 0 0 0 0 0 0 0      Review of Systems  Constitutional: Negative for fever.  HENT: Positive for congestion.   Respiratory: Positive for cough and shortness of breath.   Cardiovascular: Negative for chest pain.  Gastrointestinal: Negative  for abdominal pain.  Musculoskeletal: Negative for back pain.  Skin: Negative for rash.  Neurological: Negative for headaches.  All other systems reviewed and are negative.     Allergies  Ibuprofen; Vicodin; and Citrus  Home Medications   Prior to Admission medications   Medication Sig Start Date End Date Taking? Authorizing Provider  albuterol (PROVENTIL HFA;VENTOLIN HFA) 108 (90 BASE) MCG/ACT inhaler Inhale 2-3 puffs into the lungs every 4 (four) hours as needed for wheezing or shortness of breath. 01/30/15   Bridgett Larssonhris Karsin Pesta, MD  ampicillin (PRINCIPEN) 500 MG capsule Take 1 capsule (500 mg total) by mouth 3 (three) times daily. 01/16/15   Willodean Rosenthalarolyn Harraway-Smith, MD  benzonatate (TESSALON) 100 MG capsule Take 1 capsule (100 mg total) by mouth 3 (three) times daily as needed for cough. 01/30/15   Bridgett Larssonhris Cecil Vandyke, MD  ciprofloxacin (CIPRO) 500 MG tablet Take 1 tablet (500 mg total) by mouth 2 (two) times daily. 12/30/14   Allie BossierMyra C Dove, MD  cyclobenzaprine (FLEXERIL) 5 MG tablet Take 1 tablet (5 mg total) by mouth 3 (three) times daily. 11/10/14   Earley FavorGail Schulz, NP  ibuprofen (ADVIL,MOTRIN) 200 MG tablet Take 4 tablets (800 mg total) by mouth every 6 (six) hours as needed for moderate pain. 11/10/14   Earley FavorGail Schulz, NP  predniSONE (DELTASONE) 50 MG tablet Take one tab (50mg ) by mouth once daily for 5 days 01/30/15  Bridgett Larsson, MD  promethazine (PHENERGAN) 25 MG tablet Take 1 tablet (25 mg total) by mouth every 6 (six) hours as needed for nausea. 10/13/11 10/06/14  Lisette Paz, PA-C   BP 112/48 mmHg  Pulse 115  Temp(Src) 98.7 F (37.1 C) (Oral)  Resp 17  SpO2 100%  LMP 01/05/2015 Physical Exam  Constitutional: She is oriented to person, place, and time. She appears well-developed.  HENT:  Head: Normocephalic.  Eyes: Pupils are equal, round, and reactive to light.  Neck: Normal range of motion.  Cardiovascular: Normal rate.   HR 90's on my exam, no MRG  Pulmonary/Chest: Effort normal. She has wheezes.   Some faint wheezing throughout all lung fields. Patient also with moderate mucus production some upper airway congestion  Abdominal: Soft. She exhibits no distension. There is no tenderness.  Musculoskeletal: She exhibits no edema.  Neurological: She is alert and oriented to person, place, and time. No cranial nerve deficit. She exhibits normal muscle tone.  Skin: Skin is warm.  Psychiatric: She has a normal mood and affect.  Vitals reviewed.   ED Course  Procedures (including critical care time) Labs Review Labs Reviewed  CBC - Abnormal; Notable for the following:    WBC 11.3 (*)    All other components within normal limits  BASIC METABOLIC PANEL - Abnormal; Notable for the following:    Glucose, Bld 106 (*)    Calcium 8.8 (*)    All other components within normal limits  BRAIN NATRIURETIC PEPTIDE    Imaging Review Dg Chest 2 View  01/30/2015   CLINICAL DATA:  Fever and cough since Saturday.  EXAM: CHEST  2 VIEW  COMPARISON:  10/09/2014.  FINDINGS: The heart size and mediastinal contours are within normal limits. Both lungs are clear. The visualized skeletal structures are unremarkable.  IMPRESSION: Normal chest x-ray.   Electronically Signed   By: Rudie Meyer M.D.   On: 01/30/2015 12:10     EKG Interpretation None      MDM  34 year old female history of childhood asthma comes in with shortness of breath. Patient is a smoker. Sinus symptoms are most consistent with a viral URI with cough. She is hemodynamically stable. Satting 98% on room air on arrival. Patient on exam did have some wheezing throughout all lung fields. Also noted mucus and upper airway congestion. Given patient's history of asthma, smoking and wheezing was given multiple breathing treatments as well as Decadron here. Significant subjective improvement. Continued to sat better than 96% on room air. Chest x-ray showed no pneumothorax consolidative process or other serious. Her final reexamination patient much  improved. We'll provide 5 days prednisone, prescription for albuterol. Discussed return precautions. Patient voiced understanding. Appropriate for outpatient management.  Final diagnoses:  Viral URI with cough  Wheezing        Bridgett Larsson, MD 01/30/15 1191  Elwin Mocha, MD 01/30/15 [phone]

## 2015-01-30 NOTE — ED Notes (Signed)
Pt started having shortness of breath and fever started Saturday-- shortness of breath has continued. Tight cough-- productive for green sputum

## 2015-02-18 ENCOUNTER — Encounter: Payer: Self-pay | Admitting: General Practice

## 2015-02-19 ENCOUNTER — Ambulatory Visit: Payer: Self-pay | Admitting: Obstetrics & Gynecology

## 2015-04-26 ENCOUNTER — Emergency Department (HOSPITAL_COMMUNITY)
Admission: EM | Admit: 2015-04-26 | Discharge: 2015-04-26 | Disposition: A | Discharge status: Home / Self Care | Payer: BLUE CROSS/BLUE SHIELD | Attending: Emergency Medicine | Admitting: Emergency Medicine

## 2015-04-26 ENCOUNTER — Emergency Department (HOSPITAL_COMMUNITY): Payer: BLUE CROSS/BLUE SHIELD

## 2015-04-26 ENCOUNTER — Encounter (HOSPITAL_COMMUNITY): Payer: Self-pay | Admitting: *Deleted

## 2015-04-26 DIAGNOSIS — Z792 Long term (current) use of antibiotics: Secondary | ICD-10-CM | POA: Insufficient documentation

## 2015-04-26 DIAGNOSIS — Z79899 Other long term (current) drug therapy: Secondary | ICD-10-CM | POA: Insufficient documentation

## 2015-04-26 DIAGNOSIS — M25552 Pain in left hip: Secondary | ICD-10-CM | POA: Diagnosis not present

## 2015-04-26 DIAGNOSIS — N390 Urinary tract infection, site not specified: Secondary | ICD-10-CM | POA: Diagnosis not present

## 2015-04-26 DIAGNOSIS — Z7952 Long term (current) use of systemic steroids: Secondary | ICD-10-CM | POA: Insufficient documentation

## 2015-04-26 DIAGNOSIS — Z720 Tobacco use: Secondary | ICD-10-CM | POA: Insufficient documentation

## 2015-04-26 DIAGNOSIS — Z3202 Encounter for pregnancy test, result negative: Secondary | ICD-10-CM | POA: Insufficient documentation

## 2015-04-26 LAB — URINALYSIS, ROUTINE W REFLEX MICROSCOPIC
Bilirubin Urine: NEGATIVE
Glucose, UA: NEGATIVE mg/dL
Ketones, ur: NEGATIVE mg/dL
NITRITE: NEGATIVE
PH: 5.5 (ref 5.0–8.0)
Protein, ur: NEGATIVE mg/dL
SPECIFIC GRAVITY, URINE: 1.024 (ref 1.005–1.030)
UROBILINOGEN UA: 0.2 mg/dL (ref 0.0–1.0)

## 2015-04-26 LAB — URINE MICROSCOPIC-ADD ON

## 2015-04-26 LAB — POC URINE PREG, ED: Preg Test, Ur: NEGATIVE

## 2015-04-26 MED ORDER — KETOROLAC TROMETHAMINE 60 MG/2ML IM SOLN
60.0000 mg | Freq: Once | INTRAMUSCULAR | Status: AC
  Administered 2015-04-26: 60 mg via INTRAMUSCULAR
  Filled 2015-04-26: qty 2

## 2015-04-26 MED ORDER — METHOCARBAMOL 500 MG PO TABS
500.0000 mg | ORAL_TABLET | Freq: Once | ORAL | Status: AC
  Administered 2015-04-26: 500 mg via ORAL
  Filled 2015-04-26: qty 1

## 2015-04-26 MED ORDER — METHOCARBAMOL 500 MG PO TABS: 500.0000 mg | ORAL_TABLET | Freq: Two times a day (BID) | ORAL | Status: DC

## 2015-04-26 MED ORDER — CYCLOBENZAPRINE HCL 10 MG PO TABS: 10.0000 mg | ORAL_TABLET | Freq: Two times a day (BID) | ORAL | Status: DC | PRN

## 2015-04-26 MED ORDER — CEPHALEXIN 500 MG PO CAPS: 500.0000 mg | ORAL_CAPSULE | Freq: Three times a day (TID) | ORAL | Status: DC

## 2015-04-26 MED ORDER — PREDNISONE 20 MG PO TABS: 40.0000 mg | ORAL_TABLET | Freq: Every day | ORAL | Status: DC

## 2015-04-26 NOTE — ED Provider Notes (Signed)
CSN: [id number]     Arrival date & time 04/26/15  0601 History   First MD Initiated Contact with Patient 04/26/15 [phone]     Chief Complaint  Patient presents with  . Hip Pain     (Consider location/radiation/quality/duration/timing/severity/associated sxs/prior Treatment) HPI Comments: Pt to ED c/o increasingly worse L hip pain on set this week. pain decribed "as a charlie horse in my hip;" hx of "pinched nerve." Patient states she was taking the trash out when she felt something "pull" in her left hip. Reports chronic back pain with "pinched nerved;" pt ambulatory with assistance. Patient is also complaining of urinary frequency and is requesting a urine screen as she is prone to UTIs.   Patient is a 34 y.o. female presenting with hip pain. The history is provided by the patient.  Hip Pain This is a new problem. The current episode started in the past 7 days. The problem occurs constantly. The problem has been unchanged. Pertinent negatives include no fever, numbness or weakness. The symptoms are aggravated by walking. She has tried NSAIDs for the symptoms. The treatment provided no relief.    Past Medical History  Diagnosis Date  . UTI (lower urinary tract infection)   . Sciatic nerve pain    Past Surgical History  Procedure Laterality Date  . Cholecystectomy    . Appendectomy    . Endometrial ablation    . Tonsillectomy    . Knee arthroscopy    . Shoulder surgery Left    Family History  Problem Relation Age of Onset  . Cancer Maternal Grandmother     colon   Social History  Substance Use Topics  . Smoking status: Current Every Day Smoker -- 0.50 packs/day  . Smokeless tobacco: None  . Alcohol Use: No   OB History    Gravida Para Term Preterm AB TAB SAB Ectopic Multiple Living   0 0 0 0 0 0 0 0 0 0      Review of Systems  Constitutional: Negative for fever.  Musculoskeletal:       + L hip pain  Neurological: Negative for weakness and numbness.  All other systems  reviewed and are negative.     Allergies  Ibuprofen; Vicodin; and Citrus  Home Medications   Prior to Admission medications   Medication Sig Start Date End Date Taking? Authorizing Provider  albuterol (PROVENTIL HFA;VENTOLIN HFA) 108 (90 BASE) MCG/ACT inhaler Inhale 2-3 puffs into the lungs every 4 (four) hours as needed for wheezing or shortness of breath. 01/30/15   Bridgett Larsson, MD  ampicillin (PRINCIPEN) 500 MG capsule Take 1 capsule (500 mg total) by mouth 3 (three) times daily. 01/16/15   Willodean Rosenthal, MD  benzonatate (TESSALON) 100 MG capsule Take 1 capsule (100 mg total) by mouth 3 (three) times daily as needed for cough. 01/30/15   Bridgett Larsson, MD  cephALEXin (KEFLEX) 500 MG capsule Take 1 capsule (500 mg total) by mouth 3 (three) times daily. 04/26/15   Coumba Kellison, PA-C  ciprofloxacin (CIPRO) 500 MG tablet Take 1 tablet (500 mg total) by mouth 2 (two) times daily. 12/30/14   Allie Bossier, MD  cyclobenzaprine (FLEXERIL) 10 MG tablet Take 1 tablet (10 mg total) by mouth 2 (two) times daily as needed for muscle spasms. 04/26/15   Jonanthony Nahar, PA-C  cyclobenzaprine (FLEXERIL) 5 MG tablet Take 1 tablet (5 mg total) by mouth 3 (three) times daily. 11/10/14   Earley Favor, NP  ibuprofen (ADVIL,MOTRIN) 200 MG tablet Take  4 tablets (800 mg total) by mouth every 6 (six) hours as needed for moderate pain. 11/10/14   Earley Favor, NP  predniSONE (DELTASONE) 20 MG tablet Take 2 tablets (40 mg total) by mouth daily. 04/26/15   Francee Piccolo, PA-C  predniSONE (DELTASONE) 50 MG tablet Take one tab ( ) by mouth once daily for 5 days 01/30/15   Bridgett Larsson, MD  promethazine (PHENERGAN) 25 MG tablet Take 1 tablet (25 mg total) by mouth every 6 (six) hours as needed for nausea. 10/13/11 10/06/14  Lisette Paz, PA-C   BP 131/60 mmHg  Pulse 71  Temp(Src) 98.2 F (36.8 C) (Oral)  Resp 20  Ht  (1.676 m)  Wt 340 lb (154.223 kg)  BMI 54.90 kg/m2  SpO2 100%  LMP  04/13/2015 Physical Exam  Constitutional: She is oriented to person, place, and time. She appears well-developed and well-nourished. No distress.  HENT:  Head: Normocephalic and atraumatic.  Right Ear: External ear normal.  Left Ear: External ear normal.  Nose: Nose normal.  Mouth/Throat: Oropharynx is clear and moist. No oropharyngeal exudate.  Eyes: Conjunctivae and EOM are normal. Pupils are equal, round, and reactive to light.  Neck: Normal range of motion. Neck supple.  Cardiovascular: Normal rate, regular rhythm, normal heart sounds and intact distal pulses.   Pulmonary/Chest: Effort normal and breath sounds normal. No respiratory distress.  Abdominal: Soft. There is no tenderness.  Musculoskeletal:  + L hip pain worsened with passive and active flexion and extension  Neurological: She is alert and oriented to person, place, and time. She has normal strength. No cranial nerve deficit. Gait normal. GCS eye subscore is 4. GCS verbal subscore is 5. GCS motor subscore is 6.  Sensation grossly intact.  No pronator drift.  Bilateral heel-knee-shin intact.  Skin: Skin is warm and dry. She is not diaphoretic.  Nursing note and vitals reviewed.   ED Course  Procedures (including critical care time) Medications  ketorolac (TORADOL) injection 60 mg (60 mg Intramuscular Given 04/26/15 0644)  methocarbamol (ROBAXIN) tablet 500 mg (500 mg Oral Given 04/26/15 0644)    Labs Review Labs Reviewed  URINALYSIS, ROUTINE W REFLEX MICROSCOPIC (NOT AT [facility]) - Abnormal; Notable for the following:    APPearance CLOUDY (*)    Hgb urine dipstick MODERATE (*)    Leukocytes, UA LARGE (*)    All other components within normal limits  URINE MICROSCOPIC-ADD ON - Abnormal; Notable for the following:    Squamous Epithelial / LPF FEW (*)    Bacteria, UA MANY (*)    Crystals CA OXALATE CRYSTALS (*)    All other components within normal limits  POC URINE PREG, ED    Imaging Review Dg Hip Unilat With  Pelvis 2-3 Views Left  04/26/2015   CLINICAL DATA:  Increasing left hip pain for 1 week, no known trauma, initial encounter  EXAM: DG HIP (WITH OR WITHOUT PELVIS) 2-3V LEFT  COMPARISON:  None.  FINDINGS: Pelvic ring is intact. No acute fracture or dislocation is noted. Very minimal degenerative changes are noted bilaterally. No acute soft tissue abnormality is noted.  IMPRESSION: No acute abnormality noted.   Electronically Signed   By: Alcide Clever M.D.   On: 04/26/2015 07:56   I have personally reviewed and evaluated these images and lab results as part of my medical decision-making.   EKG Interpretation None      MDM   Final diagnoses:  Left hip pain  UTI (lower urinary tract infection)  Filed Vitals:   04/26/15 0825  BP: 131/60  Pulse: 71  Temp:   Resp: 20   Afebrile, NAD, non-toxic appearing, AAOx4.   1) Left hip pain: Patient X-Ray negative for obvious fracture or dislocation. I personally reviewed the imaging and agree with the radiologist. Neurovascularly intact. Normal sensation. No evidence of compartment syndrome. No neurofocal deficits. No red flags on history. Pain managed in ED. Pt advised to follow up with PCP if symptoms persist symptoms.    2) UTI: Pt has been diagnosed with a UTI. Pt is afebrile, no CVA tenderness, normotensive, and denies N/V. Pt to be dc home with antibiotics and instructions to follow up with PCP if symptoms persist.  Patient will be dc home &  is agreeable with above plan.   Francee Piccolo, PA-C 04/26/15 1251  Marisa Severin, MD 04/27/15 [phone]

## 2015-04-26 NOTE — ED Notes (Signed)
Pt c/o L hip pain worsening this week; pain decribed "as a charlie horse in my hip;" hx of "pinched nerve." Pt ambulatory with use of cane.

## 2015-04-26 NOTE — Discharge Instructions (Signed)
Please follow up with your primary care physician in 1-2 days. If you do not have one please call the [facility] number listed above. Please take pain medication and/or muscle relaxants as prescribed and as needed for pain. Please do not drive on narcotic pain medication or on muscle relaxants. Please take your antibiotic until completion. Please read all discharge instructions and return precautions.   Hip Pain Your hip is the joint between your upper legs and your lower pelvis. The bones, cartilage, tendons, and muscles of your hip joint perform a lot of work each day supporting your body weight and allowing you to move around. Hip pain can range from a minor ache to severe pain in one or both of your hips. Pain may be felt on the inside of the hip joint near the groin, or the outside near the buttocks and upper thigh. You may have swelling or stiffness as well.  HOME CARE INSTRUCTIONS   Take medicines only as directed by your health care provider.  Apply ice to the injured area:  Put ice in a plastic bag.  Place a towel between your skin and the bag.  Leave the ice on for 15-20 minutes at a time, 3-4 times a day.  Keep your leg raised (elevated) when possible to lessen swelling.  Avoid activities that cause pain.  Follow specific exercises as directed by your health care provider.  Sleep with a pillow between your legs on your most comfortable side.  Record how often you have hip pain, the location of the pain, and what it feels like. SEEK MEDICAL CARE IF:   You are unable to put weight on your leg.  Your hip is red or swollen or very tender to touch.  Your pain or swelling continues or worsens after 1 week.  You have increasing difficulty walking.  You have a fever. SEEK IMMEDIATE MEDICAL CARE IF:   You have fallen.  You have a sudden increase in pain and swelling in your hip. MAKE SURE YOU:   Understand these instructions.  Will watch your  condition.  Will get help right away if you are not doing well or get worse. Document Released: 02/10/2010 Document Revised: 01/07/2014 Document Reviewed: 04/19/2013 [facility] Patient Information 2015 [place], [place]. This information is not intended to replace advice given to you by your health care provider. Make sure you discuss any questions you have with your health care provider. Urinary Tract Infection Urinary tract infections (UTIs) can develop anywhere along your urinary tract. Your urinary tract is your body's drainage system for removing wastes and extra water. Your urinary tract includes two kidneys, two ureters, a bladder, and a urethra. Your kidneys are a pair of bean-shaped organs. Each kidney is about the size of your fist. They are located below your ribs, one on each side of your spine. CAUSES Infections are caused by microbes, which are microscopic organisms, including fungi, viruses, and bacteria. These organisms are so small that they can only be seen through a microscope. Bacteria are the microbes that most commonly cause UTIs. SYMPTOMS  Symptoms of UTIs may vary by age and gender of the patient and by the location of the infection. Symptoms in young women typically include a frequent and intense urge to urinate and a painful, burning feeling in the bladder or urethra during urination. Older women and men are more likely to be tired, shaky, and weak and have muscle aches and abdominal pain. A fever may mean the  infection is in your kidneys. Other symptoms of a kidney infection include pain in your back or sides below the ribs, nausea, and vomiting. DIAGNOSIS To diagnose a UTI, your caregiver will ask you about your symptoms. Your caregiver also will ask to provide a urine sample. The urine sample will be tested for bacteria and white blood cells. White blood cells are made by your body to help fight infection. TREATMENT  Typically, UTIs can be treated with medication. Because most  UTIs are caused by a bacterial infection, they usually can be treated with the use of antibiotics. The choice of antibiotic and length of treatment depend on your symptoms and the type of bacteria causing your infection. HOME CARE INSTRUCTIONS  If you were prescribed antibiotics, take them exactly as your caregiver instructs you. Finish the medication even if you feel better after you have only taken some of the medication.  Drink enough water and fluids to keep your urine clear or pale yellow.  Avoid caffeine, tea, and carbonated beverages. They tend to irritate your bladder.  Empty your bladder often. Avoid holding urine for long periods of time.  Empty your bladder before and after sexual intercourse.  After a bowel movement, women should cleanse from front to back. Use each tissue only once. SEEK MEDICAL CARE IF:   You have back pain.  You develop a fever.  Your symptoms do not begin to resolve within 3 days. SEEK IMMEDIATE MEDICAL CARE IF:   You have severe back pain or lower abdominal pain.  You develop chills.  You have nausea or vomiting.  You have continued burning or discomfort with urination. MAKE SURE YOU:   Understand these instructions.  Will watch your condition.  Will get help right away if you are not doing well or get worse. Document Released: 06/02/2005 Document Revised: 02/22/2012 Document Reviewed: 10/01/2011 [facility] Patient Information 2015 [place], [place]. This information is not intended to replace advice given to you by your health care provider. Make sure you discuss any questions you have with your health care provider.

## 2015-04-26 NOTE — ED Notes (Signed)
Patient in xray 

## 2015-04-26 NOTE — ED Notes (Signed)
Pt to ED c/o increasingly worse L hip pain on set this week. Reports chronic back pain with "pinched nerved;" pt ambulatory with assistance

## 2015-05-28 ENCOUNTER — Encounter (HOSPITAL_COMMUNITY): Payer: Self-pay | Admitting: Emergency Medicine

## 2015-05-28 ENCOUNTER — Emergency Department (HOSPITAL_COMMUNITY)
Admission: EM | Admit: 2015-05-28 | Discharge: 2015-05-28 | Disposition: A | Discharge status: Home / Self Care | Payer: BLUE CROSS/BLUE SHIELD | Attending: Physician Assistant | Admitting: Physician Assistant

## 2015-05-28 DIAGNOSIS — H538 Other visual disturbances: Secondary | ICD-10-CM | POA: Insufficient documentation

## 2015-05-28 DIAGNOSIS — Z720 Tobacco use: Secondary | ICD-10-CM | POA: Diagnosis not present

## 2015-05-28 DIAGNOSIS — Z79899 Other long term (current) drug therapy: Secondary | ICD-10-CM | POA: Insufficient documentation

## 2015-05-28 DIAGNOSIS — Z3202 Encounter for pregnancy test, result negative: Secondary | ICD-10-CM | POA: Diagnosis not present

## 2015-05-28 DIAGNOSIS — R519 Headache, unspecified: Secondary | ICD-10-CM

## 2015-05-28 DIAGNOSIS — R197 Diarrhea, unspecified: Secondary | ICD-10-CM | POA: Insufficient documentation

## 2015-05-28 DIAGNOSIS — R51 Headache: Secondary | ICD-10-CM | POA: Insufficient documentation

## 2015-05-28 DIAGNOSIS — R0981 Nasal congestion: Secondary | ICD-10-CM | POA: Insufficient documentation

## 2015-05-28 DIAGNOSIS — H53149 Visual discomfort, unspecified: Secondary | ICD-10-CM | POA: Insufficient documentation

## 2015-05-28 DIAGNOSIS — Z792 Long term (current) use of antibiotics: Secondary | ICD-10-CM | POA: Insufficient documentation

## 2015-05-28 DIAGNOSIS — N390 Urinary tract infection, site not specified: Secondary | ICD-10-CM

## 2015-05-28 DIAGNOSIS — R112 Nausea with vomiting, unspecified: Secondary | ICD-10-CM | POA: Diagnosis present

## 2015-05-28 LAB — COMPREHENSIVE METABOLIC PANEL
ALT: 25 U/L (ref 14–54)
AST: 24 U/L (ref 15–41)
Albumin: 3.4 g/dL — ABNORMAL LOW (ref 3.5–5.0)
Alkaline Phosphatase: 57 U/L (ref 38–126)
Anion gap: 6 (ref 5–15)
BUN: 9 mg/dL (ref 6–20)
CO2: 23 mmol/L (ref 22–32)
Calcium: 8.7 mg/dL — ABNORMAL LOW (ref 8.9–10.3)
Chloride: 109 mmol/L (ref 101–111)
Creatinine, Ser: 0.76 mg/dL (ref 0.44–1.00)
GFR calc Af Amer: >60 mL/min (ref >60)
GFR calc non Af Amer: >60 mL/min (ref >60)
Glucose, Bld: 111 mg/dL — ABNORMAL HIGH (ref 65–99)
Potassium: 3.9 mmol/L (ref 3.5–5.1)
Sodium: 138 mmol/L (ref 135–145)
Total Bilirubin: 0.6 mg/dL (ref 0.3–1.2)
Total Protein: 6.1 g/dL — ABNORMAL LOW (ref 6.5–8.1)

## 2015-05-28 LAB — URINALYSIS, ROUTINE W REFLEX MICROSCOPIC
Bilirubin Urine: NEGATIVE
Glucose, UA: NEGATIVE mg/dL
Ketones, ur: NEGATIVE mg/dL
Nitrite: NEGATIVE
Protein, ur: NEGATIVE mg/dL
Specific Gravity, Urine: 1.015 (ref 1.005–1.030)
Urobilinogen, UA: 1 mg/dL (ref 0.0–1.0)
pH: 5.5 (ref 5.0–8.0)

## 2015-05-28 LAB — CBC
HCT: 42.6 % (ref 36.0–46.0)
Hemoglobin: 14.9 g/dL (ref 12.0–15.0)
MCH: 32 pg (ref 26.0–34.0)
MCHC: 35 g/dL (ref 30.0–36.0)
MCV: 91.6 fL (ref 78.0–100.0)
Platelets: 256 10*3/uL (ref 150–400)
RBC: 4.65 MIL/uL (ref 3.87–5.11)
RDW: 13.6 % (ref 11.5–15.5)
WBC: 9.1 10*3/uL (ref 4.0–10.5)

## 2015-05-28 LAB — URINE MICROSCOPIC-ADD ON

## 2015-05-28 LAB — LIPASE, BLOOD: Lipase: 25 U/L (ref 22–51)

## 2015-05-28 LAB — I-STAT BETA HCG BLOOD, ED (MC, WL, AP ONLY): I-stat hCG, quantitative: <5 m[IU]/mL (ref <5)

## 2015-05-28 MED ORDER — ONDANSETRON HCL 4 MG/2ML IJ SOLN
4.0000 mg | Freq: Once | INTRAMUSCULAR | Status: AC
  Administered 2015-05-28: 4 mg via INTRAVENOUS
  Filled 2015-05-28: qty 2

## 2015-05-28 MED ORDER — DIPHENHYDRAMINE HCL 50 MG/ML IJ SOLN
25.0000 mg | Freq: Once | INTRAMUSCULAR | Status: AC
  Administered 2015-05-28: 25 mg via INTRAVENOUS
  Filled 2015-05-28: qty 1

## 2015-05-28 MED ORDER — PROCHLORPERAZINE EDISYLATE 5 MG/ML IJ SOLN
10.0000 mg | Freq: Once | INTRAMUSCULAR | Status: AC
  Administered 2015-05-28: 10 mg via INTRAVENOUS
  Filled 2015-05-28: qty 2

## 2015-05-28 MED ORDER — ACETAMINOPHEN 500 MG PO TABS
1000.0000 mg | ORAL_TABLET | Freq: Once | ORAL | Status: AC
  Administered 2015-05-28: 1000 mg via ORAL
  Filled 2015-05-28: qty 2

## 2015-05-28 MED ORDER — NITROFURANTOIN MONOHYD MACRO 100 MG PO CAPS: 100.0000 mg | ORAL_CAPSULE | Freq: Two times a day (BID) | ORAL | Status: DC

## 2015-05-28 MED ORDER — SODIUM CHLORIDE 0.9 % IV SOLN
1000.0000 mL | Freq: Once | INTRAVENOUS | Status: AC
  Administered 2015-05-28: 1000 mL via INTRAVENOUS

## 2015-05-28 MED ORDER — SODIUM CHLORIDE 0.9 % IV SOLN
1000.0000 mL | INTRAVENOUS | Status: DC
  Administered 2015-05-28: 1000 mL via INTRAVENOUS

## 2015-05-28 NOTE — Discharge Instructions (Signed)
Please take your prescription as prescribed for UTI. You may continue taking Ibuprofen/Tylenol for your headache at home as needed. Please follow up with a primary care provider from the Resource Guide provided below in 1 week. Please return to the Emergency Department if symptoms worsen or new onset of neck stiffness, visual changes, numbness, tingling, weakness, seizures, syncope.    Emergency Department Resource Guide 1) Find a Doctor and Pay Out of Pocket Although you won't have to find out who is covered by your insurance plan, it is a good idea to ask around and get recommendations. You will then need to call the office and see if the doctor you have chosen will accept you as a new patient and what types of options they offer for patients who are self-pay. Some doctors offer discounts or will set up payment plans for their patients who do not have insurance, but you will need to ask so you aren't surprised when you get to your appointment.  2) Contact Your Local Health Department Not all health departments have doctors that can see patients for sick visits, but many do, so it is worth a call to see if yours does. If you don't know where your local health department is, you can check in your phone book. The CDC also has a tool to help you locate your state's health department, and many state websites also have listings of all of their local health departments.  3) Find a Walk-in Clinic If your illness is not likely to be very severe or complicated, you may want to try a walk in clinic. These are popping up all over the country in pharmacies, drugstores, and shopping centers. They're usually staffed by nurse practitioners or physician assistants that have been trained to treat common illnesses and complaints. They're usually fairly quick and inexpensive. However, if you have serious medical issues or chronic medical problems, these are probably not your best option.  No Primary Care  Doctor: - Call Health Connect at  [phone] - they can help you locate a primary care doctor that  accepts your insurance, provides certain services, etc. - Physician Referral Service- [phone]  Chronic Pain Problems: Organization         Address  Phone   Notes  [facility]  [phone] Patients need to be referred by their primary care doctor.   Medication Assistance: Organization         Address  Phone   Notes  [facility] Medication [facility] [address] [place] [place], [place] [address] [phone] --Must be a resident of [facility] -- Must have NO insurance coverage whatsoever (no Medicaid/ Medicare, etc.) -- The pt. MUST have a primary care doctor that directs their care regularly and follows them in the community   [facility]  [phone]   [employer]  [phone]    Agencies that provide inexpensive medical care: Organization         Address  Phone   Notes  [facility]  [phone]   [facility]    [phone]   [facility] [address] [place], [place] [address] [phone]   [facility] [address] [place], [place] [phone]   Planned Parenthood    [phone]   [facility]    [phone]   [facility]  [address] [place], [place] Phone:  [phone])  [id number], Fax:  [phone] Hours of Operation:  9 am - 6 pm, M-F.  Also accepts Medicaid/Medicare and self-pay.  [facility] for [place], [address], [place] Phone: [phone], Fax: [phone]. Hours of Operation:  8:30 am - 5:30 pm, M-F.  Also accepts Medicaid and self-pay.  [facility] [place] [address], [place] Phone: [phone]   [place], [place], [place] [phone], Ext. [phone] Mondays & Thursdays: 7-9 AM.  First 15 patients are seen on a first  come, first serve basis.    [place] Providers:  Organization         Address  Phone   Notes  [facility] [place] [address], [place] [phone] Also accepts self-pay patients.  [facility] [id number] [place], [place]  [phone]   [place], [address], [place] [phone]   [facility] [address], [place] [phone]   Lucianne Lei [address], [place]   [phone] Only accepts [place] Access [place] patients after they have their name applied to their card.   Self-Pay (no insurance) in [facility]:  Organization         Address  Phone   Notes  Sickle Cell Patients, [facility] [place] [phone]   [facility] [place] [phone]   [facility] [place]  [place], [place], [place] [phone]   [facility]/Dr. Osei-Bonsu  [address] [place] or [address], [place] [phone] Phone number for both [place] and [place] locations is the same.  [facility] [place] [address], [place] [phone]   [place] [address], [place] or [address] [phone]   [facility] [address], [place] [phone], phone; [phone], fax Sees patients 1st and 3rd Saturday of every month.  Must not qualify for public or private insurance (i.e. Medicaid, Medicare, [place] Health Choice, Veterans' Benefits)  Household income should be no more than 200% of the poverty level The clinic cannot treat you if you are pregnant or think you are pregnant  Sexually transmitted diseases are not treated at the clinic.    Dental Care: Organization          Address  Phone  Notes  [facility] [phone] Accepts children up to age 20 who are enrolled in [place] or [place]; pregnant women with a Medicaid card; and children who have applied for Medicaid or [place] Health Choice, but were declined, whose parents can pay a reduced fee at time of service.  [facility] Department of [facility]  [address], [place] [phone] Accepts children up to age 65 who are enrolled in [place] or [place]; pregnant women with a Medicaid card; and children who have applied for Medicaid or [place] Health Choice, but were declined, whose parents can pay a reduced fee at time of service.  [facility]  [place] [phone] Patients are seen by appointment only. Walk-ins are  not accepted. [place] will see patients 46 years of age and older. Monday - Tuesday (8am-5pm) Most Wednesdays (8:30-5pm) $30 per visit, cash only  [facility] Adult Dental Access PROGRAM  [address], [facility] [phone] Patients are seen by appointment only. Walk-ins are not accepted. [place] will see patients 60 years of age and older. One Wednesday Evening (Monthly: Volunteer Based).  $30 per visit, cash only  [place]  [phone] for adults; Children under age 9, call Graduate Pediatric Dentistry at [phone]. Children aged 38-14, please call [phone] to request a pediatric application.  Dental services are provided in all areas of dental care including fillings, crowns and bridges, complete and partial dentures, implants, gum treatment, root canals, and extractions. Preventive care is also provided. Treatment is provided to both adults and children. Patients are selected via a lottery and there is often a waiting list.   [facility] [address] [place]  [phone] [email or web address]   [facility] [address] [place], [place] [phone], Ext. [phone] Second and Fourth Thursday of each month, opens at 6:30 AM; Clinic ends at 9 AM.  Patients are seen on a first-come first-served basis, and a limited number are seen during each clinic.   [facility]  [address] [place]ard Danker [place], [place] [phone]   Eligibility Requirements You must have lived in [place], [place], or [place] counties for at least the last three months.   You cannot be eligible for state or federal sponsored [employer], including [employer], [place], or [employer].   You generally cannot be eligible for healthcare insurance through your employer.    How to apply: Eligibility screenings are held every Tuesday and Wednesday afternoon from 1:00 pm until 4:00 pm. You do not need an appointment for the interview!  [facility] [address], [place], [place]   [place] Department  [place]  [phone]    Behavioral Health Resources in the Community: Intensive Outpatient Programs Organization         Address  Phone  Notes  [place]. [address] [place], [place] [phone]   [facility] Outpatient [address], [place], [place]   ADS: Alcohol [facility] Drug S[facility] [address], [place], [place]   [place] [address],  [place], [place] or [phone]   Substance Abuse Resources Organization         Address  Phone  Notes  [facility] Drug Services  [phone]   [place]  [phone]   [facility] [place]   Chinita Pester  [phone]   Residential & Outpatient Substance Abuse Program  [phone]   Psychological  Services Organization         Address  Phone  Notes  [facility] [place]  [place]  [phone]   [place] [address], [place] [phone] or [phone]    Mobile Crisis Teams Organization         Address  Phone  Notes  [facility], Mobile Crisis Care Unit  [phone]   [facility]  [address] [place], [place]   [facility] [address] [place] [phone]    Self-Help/Support  Groups Organization         Address  Phone             Notes  [facility] [place] - variety of support groups  [phone]- I7437963 Call for more information  Narcotics Anonymous (NA), [facility] [address], [employer] [place]  2 meetings at this location   Statistician         Address  Phone  Notes  [facility] [address],    [place]  [phone]   [facility]  [address], [place] [address], [place], [place] [phone]   [facility] [address] [place], [place] [phone] Admissions: 8am-3pm M-F  [facility] [address]    [place], [place] [phone]   [facility] [address] [place], [place], [place] [phone]   [facility] [address],  [place], [place] [phone]   [facility] - Intensive Outpatient [address], [place], [place] [phone]   [facility] (Addiction Recovery [facility] [address] [place].,  [place], [place] [phone] or [phone]   [facility] Services (RTS) [address], [place], [place] [phone] Accepts Medicaid  [facility] [place] [address]  [place] [phone] Substance Abuse/Addiction Treatment   [facility] Organization         Address  Phone  Notes  [facility]  [phone]   Angie Fava, PhD [address] [place]vin Kn[place], [place]   [phone] or [phone]   [facility]   [address] [place], [place] [phone]   [facility] [address], [place], [place] [phone] Insurance/Medicaid/sponsorship through [facility] and Families [address]                                    [place], [place] [phone] Therapy/tele-psych/case  [facility] [address], [place] [phone]    Dr. Lolly Mustache  [phone]   [facility]  [employer] [facility] Dept. 1) [address] [place] 2) [address], [place] 3)  [address], [place] [phone]  [phone]   [facility] Child Abuse Hotline (605)438-4606 or [phone] (After Hours)       .

## 2015-05-28 NOTE — ED Notes (Signed)
Pt reported N/V/D starting Wednesday but did not "think anything of it because my cycle started Tuesday and sometimes I get a little sick. Saturday I started having migraines.My headache is only on the right side. This morning I started throwing up again. I had some chills and night sweats." Denies blood in vomit or diarrhea. Reports eating makes N/V worse. Pt has had cholecystectomy and appendectomy. Also was recently treated for UTIs. Says she finished her abx regimen. Back pain is usually the only symptom associated with her UTIs but does not have back pain at this time. Denies any dysuria or urinary changes. Neurologically intact. No other c/c.

## 2015-05-28 NOTE — ED Provider Notes (Signed)
CSN: [id number]     Arrival date & time 05/28/15  0544 History   First MD Initiated Contact with Patient 05/28/15 [phone]     Chief Complaint  Patient presents with  . Nausea  . Emesis  . Diarrhea     (Consider location/radiation/quality/duration/timing/severity/associated sxs/prior Treatment) HPI Comments: Pt is a 34 yo female who presents to the ED with complaint of N/V/D. Pt reports nausea and NBNB vomiting started 1 week ago. She states her LMP started last Tuesday (05/20/15) and notes she occasionally will have N/V with her menstrual cycle. Pt reports she started having diarrhea on Friday. She notes she also started to have HA on Saturday. She reports having a HA located to the right side of her head that alternates between throbbing and sharp pain. No aggravating or alleviating factors. Associated photophobia and blurred vision. She notes this HA feels similar to migraines she has had in the past. Pt also endorses subjective fever, chills, night sweats, right sided nasal congestion and generalized abdominal cramping. She reports a history of UTIs and notes she finished antibiotics 2 weeks ago for her last UTI (Keflex). She notes she has taken ibuprofen, tylenol, Goody's and Hydrocodone at home with mild relief. History of cholecystecomy and appendectomy.   Patient is a 34 y.o. female presenting with vomiting and diarrhea.  Emesis Associated symptoms: abdominal pain, chills, diarrhea and headaches   Diarrhea Associated symptoms: abdominal pain, chills, fever, headaches and vomiting     Past Medical History  Diagnosis Date  . UTI (lower urinary tract infection)   . Sciatic nerve pain    Past Surgical History  Procedure Laterality Date  . Cholecystectomy    . Appendectomy    . Endometrial ablation    . Tonsillectomy    . Knee arthroscopy    . Shoulder surgery Left    Family History  Problem Relation Age of Onset  . Cancer Maternal Grandmother     colon   Social History   Substance Use Topics  . Smoking status: Current Every Day Smoker -- 0.50 packs/day  . Smokeless tobacco: None  . Alcohol Use: No   OB History    Gravida Para Term Preterm AB TAB SAB Ectopic Multiple Living       Review of Systems  Constitutional: Positive for fever and chills.  HENT: Positive for congestion.   Eyes: Positive for photophobia and visual disturbance.  Gastrointestinal: Positive for nausea, vomiting, abdominal pain and diarrhea.  Neurological: Positive for headaches.  All other systems reviewed and are negative.     Allergies  Ibuprofen; Vicodin; and Citrus  Home Medications   Prior to Admission medications   Medication Sig Start Date End Date Taking? Authorizing Provider  albuterol (PROVENTIL HFA;VENTOLIN HFA) 108 (90 BASE) MCG/ACT inhaler Inhale 2-3 puffs into the lungs every 4 (four) hours as needed for wheezing or shortness of breath. 01/30/15   Bridgett Larsson, MD  ampicillin (PRINCIPEN) 500 MG capsule Take 1 capsule (500 mg total) by mouth 3 (three) times daily. 01/16/15   Willodean Rosenthal, MD  benzonatate (TESSALON) 100 MG capsule Take 1 capsule (100 mg total) by mouth 3 (three) times daily as needed for cough. 01/30/15   Bridgett Larsson, MD  cephALEXin (KEFLEX) 500 MG capsule Take 1 capsule (500 mg total) by mouth 3 (three) times daily. 04/26/15   Jennifer Piepenbrink, PA-C  ciprofloxacin (CIPRO) 500 MG tablet Take 1 tablet (500 mg total) by mouth 2 (  two) times daily. 12/30/14   Allie Bossier, MD  cyclobenzaprine (FLEXERIL) 10 MG tablet Take 1 tablet (10 mg total) by mouth 2 (two) times daily as needed for muscle spasms. 04/26/15   Jennifer Piepenbrink, PA-C  cyclobenzaprine (FLEXERIL) 5 MG tablet Take 1 tablet (5 mg total) by mouth 3 (three) times daily. 11/10/14   Earley Favor, NP  ibuprofen (ADVIL,MOTRIN) 200 MG tablet Take 4 tablets (800 mg total) by mouth every 6 (six) hours as needed for moderate pain. 11/10/14   Earley Favor, NP  predniSONE  (DELTASONE) 20 MG tablet Take 2 tablets (40 mg total) by mouth daily. 04/26/15   Francee Piccolo, PA-C  predniSONE (DELTASONE) 50 MG tablet Take one tab ( ) by mouth once daily for 5 days 01/30/15   Bridgett Larsson, MD  promethazine (PHENERGAN) 25 MG tablet Take 1 tablet (25 mg total) by mouth every 6 (six) hours as needed for nausea. 10/13/11 10/06/14  Lisette Paz, PA-C   BP 152/92 mmHg  Pulse 88  Temp(Src) 98.2 F (36.8 C) (Oral)  Resp 16  Ht 5' 6.6" (1.692 m)  Wt 338 lb (153.316 kg)  BMI 53.55 kg/m2  SpO2 99%  LMP 05/20/2015 (Exact Date) Physical Exam  Constitutional: She is oriented to person, place, and time. She appears well-developed and well-nourished. No distress.  Morbidly obese female sitting in bed comfortably who appears to be in NAD.  HENT:  Head: Normocephalic and atraumatic.  Right Ear: Tympanic membrane normal.  Left Ear: Tympanic membrane normal.  Nose: Right sinus exhibits maxillary sinus tenderness. Right sinus exhibits no frontal sinus tenderness. Left sinus exhibits no maxillary sinus tenderness and no frontal sinus tenderness.  Mouth/Throat: Uvula is midline, oropharynx is clear and moist and mucous membranes are normal. No oropharyngeal exudate.  Eyes: Conjunctivae and EOM are normal. Pupils are equal, round, and reactive to light. Right eye exhibits no discharge. Left eye exhibits no discharge. No scleral icterus.  Neck: Normal range of motion. Neck supple.  Cardiovascular: Normal rate, regular rhythm, normal heart sounds and intact distal pulses.   Pulmonary/Chest: Effort normal and breath sounds normal. She has no wheezes. She has no rales. She exhibits no tenderness.  Abdominal: Soft. Bowel sounds are normal. She exhibits no distension and no mass. There is tenderness (generalized abdominal TTP). There is no rebound and no guarding.  Musculoskeletal: Normal range of motion. She exhibits no edema or tenderness.  Lymphadenopathy:    She has no cervical  adenopathy.  Neurological: She is alert and oriented to person, place, and time. She has normal strength and normal reflexes. No cranial nerve deficit or sensory deficit. She displays a negative Romberg sign. Coordination normal.  Skin: Skin is warm and dry. She is not diaphoretic.  Nursing note and vitals reviewed.   ED Course  Procedures (including critical care time) Labs Review Labs Reviewed  LIPASE, BLOOD  COMPREHENSIVE METABOLIC PANEL  CBC  URINALYSIS, ROUTINE W REFLEX MICROSCOPIC (NOT AT [facility])  I-STAT BETA HCG BLOOD, ED ([facility], [facility], [facility] ONLY)    Imaging Review No results found. I have personally reviewed and evaluated these images and lab results as part of my medical decision-making.  Filed Vitals:   05/28/15 0750  BP: 112/57  Pulse: 63  Temp: 97.7 F (36.5 C)  Resp: 16   Meds given in ED:  Medications  0.9 %  sodium chloride infusion (0 mLs Intravenous Stopped 05/28/15 0748)    Followed by  0.9 %  sodium chloride infusion (1,000 mLs Intravenous  New Bag/Given 05/28/15 0748)  ondansetron (ZOFRAN) injection 4 mg (4 mg Intravenous Given 05/28/15 0650)  acetaminophen (TYLENOL) tablet 1,000 mg (1,000 mg Oral Given 05/28/15 0650)  diphenhydrAMINE (BENADRYL) injection 25 mg (25 mg Intravenous Given 05/28/15 0755)  prochlorperazine (COMPAZINE) injection 10 mg (10 mg Intravenous Given 05/28/15 0755)    New Prescriptions   NITROFURANTOIN, MACROCRYSTAL-MONOHYDRATE, (MACROBID) 100 MG CAPSULE    Take 1 capsule (100 mg total) by mouth 2 (two) times daily.     MDM   Final diagnoses:  UTI (lower urinary tract infection)  Nonintractable headache, unspecified chronicity pattern, unspecified headache type    Pt presents with N/V/D, migraine and abdominal cramping. Mild relief with ibuprofen, tylenol, Goody's powder and Hydrocodone at home. LMP ended 3 days ago. History of cholecystectomy and appendectomy. VSS. Generalized abdominal TTP, right maxillary sinus TTP, no neuro deficits. Pt  given IVF, zofran and tylenol.   UA reveals UTI, remaining labs unremarkable. Pt reports nausea has improved but HA is unchanged. Pt given benadryl and compazine. She reports HA has improved. I do not suspect intracranial lesion, meningitis, encephalopathy or encephalitis and do not think that cranial imaging is needed at this time.  Pt reports she has had frequent UTIs, appx. one per month. Plan to d/c pt home with antibiotic for UTI and advised to take OTC decongestant for congestion. Pt given Resource Guide to follow up with PCP in a week.   Evaluation does not show pathology requring ongoing emergent intervention or admission. Pt is hemodynamically stable and mentating appropriately. Discussed findings/results and plan with patient/guardian, who agrees with plan. All questions answered. Return precautions discussed and outpatient follow up given.        Satira Sark [place], [place] 05/28/15 0820  Devoria Albe, MD 05/30/15 2308

## 2015-05-31 ENCOUNTER — Encounter (HOSPITAL_COMMUNITY): Payer: Self-pay | Admitting: Emergency Medicine

## 2015-05-31 ENCOUNTER — Emergency Department (HOSPITAL_COMMUNITY)
Admission: EM | Admit: 2015-05-31 | Discharge: 2015-05-31 | Disposition: A | Discharge status: Home / Self Care | Payer: BLUE CROSS/BLUE SHIELD | Attending: Emergency Medicine | Admitting: Emergency Medicine

## 2015-05-31 DIAGNOSIS — X12XXXA Contact with other hot fluids, initial encounter: Secondary | ICD-10-CM | POA: Diagnosis not present

## 2015-05-31 DIAGNOSIS — Z720 Tobacco use: Secondary | ICD-10-CM | POA: Insufficient documentation

## 2015-05-31 DIAGNOSIS — T23101A Burn of first degree of right hand, unspecified site, initial encounter: Secondary | ICD-10-CM

## 2015-05-31 DIAGNOSIS — Z7982 Long term (current) use of aspirin: Secondary | ICD-10-CM | POA: Insufficient documentation

## 2015-05-31 DIAGNOSIS — Z87440 Personal history of urinary (tract) infections: Secondary | ICD-10-CM | POA: Diagnosis not present

## 2015-05-31 DIAGNOSIS — Y9389 Activity, other specified: Secondary | ICD-10-CM | POA: Diagnosis not present

## 2015-05-31 DIAGNOSIS — Y998 Other external cause status: Secondary | ICD-10-CM | POA: Diagnosis not present

## 2015-05-31 DIAGNOSIS — Y9289 Other specified places as the place of occurrence of the external cause: Secondary | ICD-10-CM | POA: Insufficient documentation

## 2015-05-31 DIAGNOSIS — T23001A Burn of unspecified degree of right hand, unspecified site, initial encounter: Secondary | ICD-10-CM | POA: Diagnosis present

## 2015-05-31 DIAGNOSIS — Z79899 Other long term (current) drug therapy: Secondary | ICD-10-CM | POA: Diagnosis not present

## 2015-05-31 LAB — URINE CULTURE

## 2015-05-31 MED ORDER — ACETAMINOPHEN 325 MG PO TABS
650.0000 mg | ORAL_TABLET | Freq: Once | ORAL | Status: AC
  Administered 2015-05-31: 650 mg via ORAL
  Filled 2015-05-31: qty 2

## 2015-05-31 MED ORDER — BACITRACIN ZINC 500 UNIT/GM EX OINT
TOPICAL_OINTMENT | Freq: Two times a day (BID) | CUTANEOUS | Status: DC
  Administered 2015-05-31: 1 via TOPICAL

## 2015-05-31 NOTE — Discharge Instructions (Signed)
Burn Care Take Tylenol for pain. Apply ice to the area for comfort. Return for drainage or increased redness of the wound. Follow up with a primary care provider using the resource guide below. Burns hurt your skin. When your skin is hurt, it is easier to get an infection. Follow your doctor's directions to help prevent an infection. HOME CARE  Wash your hands well before you change your bandage.  Change your bandage as often as told by your doctor.  Remove the old bandage. If the bandage sticks, soak it off with cool, clean water.  Gently clean the burn with mild soap and water.  Pat the burn dry with a clean, dry cloth.  Put a thin layer of medicated cream on the burn.  Put a clean bandage on as told by your doctor.  Keep the bandage clean and dry.  Raise (elevate) the burn for the first 24 hours. After that, follow your doctor's directions.  Only take medicine as told by your doctor. GET HELP RIGHT AWAY IF:   You have too much pain.  The skin near the burn is red, tender, puffy (swollen), or has red streaks.  The burn area has yellowish white fluid (pus) or a bad smell coming from it.  You have a fever. MAKE SURE YOU:   Understand these instructions.  Will watch your condition.  Will get help right away if you are not doing well or get worse. Document Released: 06/01/2008 Document Revised: 11/15/2011 Document Reviewed: 01/13/2011 [facility] Patient Information 2015 [place], [place]. This information is not intended to replace advice given to you by your health care provider. Make sure you discuss any questions you have with your health care provider.  Emergency Department Resource Guide 1) Find a Doctor and Pay Out of Pocket Although you won't have to find out who is covered by your insurance plan, it is a good idea to ask around and get recommendations. You will then need to call the office and see if the doctor you have chosen will accept you as a new patient and what  types of options they offer for patients who are self-pay. Some doctors offer discounts or will set up payment plans for their patients who do not have insurance, but you will need to ask so you aren't surprised when you get to your appointment.  2) Contact Your Local Health Department Not all health departments have doctors that can see patients for sick visits, but many do, so it is worth a call to see if yours does. If you don't know where your local health department is, you can check in your phone book. The CDC also has a tool to help you locate your state's health department, and many state websites also have listings of all of their local health departments.  3) Find a Walk-in Clinic If your illness is not likely to be very severe or complicated, you may want to try a walk in clinic. These are popping up all over the country in pharmacies, drugstores, and shopping centers. They're usually staffed by nurse practitioners or physician assistants that have been trained to treat common illnesses and complaints. They're usually fairly quick and inexpensive. However, if you have serious medical issues or chronic medical problems, these are probably not your best option.  No Primary Care Doctor: - Call Health Connect at  [phone] - they can help you locate a primary care doctor that  accepts your insurance, provides certain services, etc. - Physician Referral Service- [phone]  Chronic Pain Problems: Organization         Address  Phone   Notes  [facility]  [phone] Patients need to be referred by their primary care doctor.   Medication Assistance: Organization         Address  Phone   Notes  [facility] Medication [facility] [address] [place] [place], [place] [address] [phone] --Must be a resident of [facility] -- Must have NO insurance coverage whatsoever (no Medicaid/ Medicare, etc.) -- The pt. MUST have a primary care doctor that  directs their care regularly and follows them in the community   [facility]ist  [phone]   [employer]  [phone]    Agencies that provide inexpensive medical care: Organization         Address  Phone   Notes  [facility]  [phone]   [facility]    [phone]   [facility] [address] [place], [place] [address] [phone]   [facility] [address] [place] [place] [phone]   Planned Parenthood    [phone]   [facility]    [phone]   [facility]  [address], [place] Phone:  [phone], Fax:  [phone] Hours of Operation:  9 am - 6 pm, M-F.  Also accepts Medicaid/Medicare and self-pay.  [facility]  [address], [place] Phone: [phone], Fax: [phone]. Hours of Operation:  8:30 am - 5:30 pm, M-F.  Also accepts Medicaid and self-pay.  [facility] [place] [address] [place] Phone: [phone]   [facility] [address] Natasha Bence [place], [place] [phone], Ext. [phone] Mondays & Thursdays: 7-9 AM.  First 15 patients are seen on a first come, first serve basis.    Medicaid-accepting [facility] Providers:  Organization         Address  Phone   Notes  [facility] [address], [place] [phone] Also accepts self-pay patients.  [facility] [address] Laurell Josephs [place], [place]  [phone]   [facility] [address] [place] [address], [place] [phone]   [facility] [address] [place] [phone]   Renaye Rakers [address], [place]   [phone] Only accepts [place] Access [place] patients after they have their name applied to their card.   Self-Pay (no insurance) in [facility]:  Organization          Address  Phone   Notes  Sickle Cell Patients, [facility] [address] [place], [place] [phone]   [facility] [address] [place], [place] [phone]   [facility] [place]  [address], [place] [phone]   [facility]/Dr. Osei-Bonsu  [address], [place] or [address], [place] [phone] Phone number for both [place] and [place] locations is the same.  [facility] [address], [place] [phone]   [facility] [address] [place] or [address] [phone]   [facility] [address], [place] [phone], phone; [phone], fax Sees  patients 1st and 3rd Saturday of every month.  Must not qualify for public or private insurance (i.e. Medicaid, Medicare, [place] Health Choice, Veterans' Benefits)  Household income should be no more than 200% of the poverty level The clinic cannot treat you if you are pregnant or think you are pregnant  Sexually transmitted diseases are not treated at the clinic.    Dental Care: Organization         Address  Phone  Notes  [facility] [place] [phone] Accepts children up to age 63 who are enrolled in [place] or [place]; pregnant women with a Medicaid card; and children who have applied for Medicaid or [place] Health Choice, but were declined, whose parents can pay a reduced fee at time of service.  [facility] Department of [facility]  [address], [place] [phone] Accepts children up to age 75 who are enrolled in [place] or [place]; pregnant women with a Medicaid card; and children who have applied for Medicaid or  Health Choice, but were declined, whose parents can pay a reduced fee at time of  service.  [facility]  [place] [phone] Patients are seen by appointment only. Walk-ins are not accepted. [place] will see patients 67 years of age and older. Monday - Tuesday (8am-5pm) Most Wednesdays (8:30-5pm) $30 per visit, cash only  [facility]  [address], [facility] [phone] Patients are seen by appointment only. Walk-ins are not accepted. [place] will see patients 65 years of age and older. One Wednesday Evening (Monthly: Volunteer Based).  $30 per visit, cash only  [place]  [phone] for adults; Children under age 49, call Graduate Pediatric Dentistry at [phone]. Children aged 1-14, please call [phone] to request a pediatric application.  Dental services are provided in all areas of dental care including fillings, crowns and bridges, complete and partial dentures, implants, gum treatment, root canals, and extractions. Preventive care is also provided. Treatment is provided to both adults and children. Patients are selected via a lottery and there is often a waiting list.   [facility] [address] [place]  [phone] [email or web address]   [facility] [address] [place], [place] [phone], Ext. [phone] Second and Fourth Thursday of each month, opens at 6:30 AM; Clinic ends at 9 AM.  Patients are seen on a first-come first-served basis, and a limited number are seen during each clinic.   [facility]  [address] Hillard Danker [place], [place] [phone]   Eligibility Requirements You must have lived in [place], [place], or [place] counties for at least the last three months.   You cannot be eligible for state or federal sponsored [employer], including [employer], [place], or [employer].   You generally cannot be eligible for healthcare insurance through your employer.     How to apply: Eligibility screenings are held every Tuesday and Wednesday afternoon from 1:00 pm until 4:00 pm. You do not need an appointment for the interview!  [facility] [address] [place], [place]   [place]  [place] Department  [place]  [phone]    Behavioral Health Resources in the Community: Intensive Outpatient Programs  Organization         Address  Phone  Notes  [employer] [facility] [address] [place], [place] [phone]   [facility]patient [address], [place], [place]   ADS: [facility] Drug Svcs [address], [place], [place]   [place] [address],  [place], [place] or [phone]   Substance Abuse Resources Organization         Address  Phone  Notes  [facility] Drug Services  [phone]   [place]  [phone]   [place]   Chinita Pester  [phone]   Residential & Outpatient Substance Abuse Program  [phone]   Psychological Services Organization         Address  Phone  Notes  [facility]  [place]  [phone]   [place] [address] [place] or [phone]    Mobile Crisis Teams Organization         Address  Phone  Notes  [facility], Mobile Crisis Care Unit  [phone]   [facility]  [address] [place], [place]   [place] [address] [place] [phone]    Self-Help/Support Groups Organization         Address  Phone             Notes  [place]. of [place] - variety of support groups  [place] Call for more information  Narcotics Anonymous (NA), [facility] [address], [employer] [place]  2 meetings at this location   Special educational needs teacher         Address  Phone  Notes  [facility] Residential Treatment [place],    [place]  [phone]   [facility]  [address] [place] [phone], [place], [place]   [employer] [place], [place] [phone] Admissions: 8am-3pm M-F  Incentives Substance [place] [address]    [place], [place] [phone]   [facility] [address] [place], [place], [place]   [facility] [address]  [place], [place]   [facility] Programs - Intensive Outpatient [address] Kristeen Mans [address], [place], [place]   [facility] ([place].,  [place], [place] [phone] or [phone]   [facility] Services (RTS) [address] [place], [place] Accepts Medicaid  [facility] [address],  [place] [phone] Substance Abuse/Addiction Treatment   [facility] Organization         Address  Phone  Notes  [facility]  [phone]   Domenic Schwab, PhD [address] Arlis [place], [place]   [phone] or [phone]   [place], [place] [phone]   [place] [address] [place], [place] [phone] Insurance/Medicaid/sponsorship through [facility] and Families [address] [place]                                    [place], [place] [phone] [place] [address] [place] [phone]    Dr. Adele Schilder  (  [phone]) [phone]   [facility] Dept. 1) [address] [place] 2) [place] 3)  [place] [address], [place] [phone]  [phone]   [place] [phone] or [phone] (After Hours)
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
## 2015-07-23 ENCOUNTER — Emergency Department (HOSPITAL_COMMUNITY)
Admission: EM | Admit: 2015-07-23 | Discharge: 2015-07-23 | Disposition: A | Discharge status: Home / Self Care | Payer: BLUE CROSS/BLUE SHIELD | Attending: Emergency Medicine | Admitting: Emergency Medicine

## 2015-07-23 ENCOUNTER — Encounter (HOSPITAL_COMMUNITY): Payer: Self-pay

## 2015-07-23 DIAGNOSIS — N390 Urinary tract infection, site not specified: Secondary | ICD-10-CM | POA: Insufficient documentation

## 2015-07-23 DIAGNOSIS — Z7952 Long term (current) use of systemic steroids: Secondary | ICD-10-CM | POA: Insufficient documentation

## 2015-07-23 DIAGNOSIS — Z79899 Other long term (current) drug therapy: Secondary | ICD-10-CM | POA: Diagnosis not present

## 2015-07-23 DIAGNOSIS — F17200 Nicotine dependence, unspecified, uncomplicated: Secondary | ICD-10-CM | POA: Insufficient documentation

## 2015-07-23 DIAGNOSIS — L02411 Cutaneous abscess of right axilla: Secondary | ICD-10-CM | POA: Insufficient documentation

## 2015-07-23 DIAGNOSIS — Z8739 Personal history of other diseases of the musculoskeletal system and connective tissue: Secondary | ICD-10-CM | POA: Insufficient documentation

## 2015-07-23 LAB — URINALYSIS, ROUTINE W REFLEX MICROSCOPIC
Bilirubin Urine: NEGATIVE
Glucose, UA: NEGATIVE mg/dL
KETONES UR: NEGATIVE mg/dL
NITRITE: NEGATIVE
PH: 5.5 (ref 5.0–8.0)
PROTEIN: NEGATIVE mg/dL
SPECIFIC GRAVITY, URINE: 1.019 (ref 1.005–1.030)

## 2015-07-23 LAB — URINE MICROSCOPIC-ADD ON

## 2015-07-23 MED ORDER — LIDOCAINE HCL (PF) 1 % IJ SOLN
5.0000 mL | Freq: Once | INTRAMUSCULAR | Status: AC
  Administered 2015-07-23: 5 mL

## 2015-07-23 MED ORDER — TRAMADOL HCL 50 MG PO TABS: 50.0000 mg | ORAL_TABLET | Freq: Four times a day (QID) | ORAL | Status: DC | PRN

## 2015-07-23 MED ORDER — NITROFURANTOIN MONOHYD MACRO 100 MG PO CAPS: 100.0000 mg | ORAL_CAPSULE | Freq: Two times a day (BID) | ORAL | Status: DC

## 2015-07-23 MED ORDER — DOXYCYCLINE HYCLATE 100 MG PO TABS
100.0000 mg | ORAL_TABLET | Freq: Once | ORAL | Status: AC
  Administered 2015-07-23: 100 mg via ORAL

## 2015-07-23 MED ORDER — NITROFURANTOIN MONOHYD MACRO 100 MG PO CAPS
100.0000 mg | ORAL_CAPSULE | Freq: Once | ORAL | Status: AC
  Administered 2015-07-23: 100 mg via ORAL
  Filled 2015-07-23: qty 1

## 2015-07-23 MED ORDER — DOXYCYCLINE HYCLATE 100 MG PO CAPS: 100.0000 mg | ORAL_CAPSULE | Freq: Two times a day (BID) | ORAL | Status: DC

## 2015-07-23 NOTE — Discharge Instructions (Signed)
Abscess An abscess is an infected area that contains a collection of pus and debris.It can occur in almost any part of the body. An abscess is also known as a furuncle or boil. CAUSES  An abscess occurs when tissue gets infected. This can occur from blockage of oil or sweat glands, infection of hair follicles, or a minor injury to the skin. As the body tries to fight the infection, pus collects in the area and creates pressure under the skin. This pressure causes pain. People with weakened immune systems have difficulty fighting infections and get certain abscesses more often.  SYMPTOMS Usually an abscess develops on the skin and becomes a painful mass that is red, warm, and tender. If the abscess forms under the skin, you may feel a moveable soft area under the skin. Some abscesses break open (rupture) on their own, but most will continue to get worse without care. The infection can spread deeper into the body and eventually into the bloodstream, causing you to feel ill.  DIAGNOSIS  Your caregiver will take your medical history and perform a physical exam. A sample of fluid may also be taken from the abscess to determine what is causing your infection. TREATMENT  Your caregiver may prescribe antibiotic medicines to fight the infection. However, taking antibiotics alone usually does not cure an abscess. Your caregiver may need to make a small cut (incision) in the abscess to drain the pus. In some cases, gauze is packed into the abscess to reduce pain and to continue draining the area. HOME CARE INSTRUCTIONS   Only take over-the-counter or prescription medicines for pain, discomfort, or fever as directed by your caregiver.  If you were prescribed antibiotics, take them as directed. Finish them even if you start to feel better.  If gauze is used, follow your caregiver's directions for changing the gauze.  To avoid spreading the infection:  Keep your draining abscess covered with a  bandage.  Wash your hands well.  Do not share personal care items, towels, or whirlpools with others.  Avoid skin contact with others.  Keep your skin and clothes clean around the abscess.  Keep all follow-up appointments as directed by your caregiver. SEEK MEDICAL CARE IF:   You have increased pain, swelling, redness, fluid drainage, or bleeding.  You have muscle aches, chills, or a general ill feeling.  You have a fever. MAKE SURE YOU:   Understand these instructions.  Will watch your condition.  Will get help right away if you are not doing well or get worse.   This information is not intended to replace advice given to you by your health care provider. Make sure you discuss any questions you have with your health care provider.   Document Released: 06/02/2005 Document Revised: 02/22/2012 Document Reviewed: 11/05/2011 Elsevier Interactive Patient Education 2016 [employer]  Urinary Tract Infection Urinary tract infections (UTIs) can develop anywhere along your urinary tract. Your urinary tract is your body's drainage system for removing wastes and extra water. Your urinary tract includes two kidneys, two ureters, a bladder, and a urethra. Your kidneys are a pair of bean-shaped organs. Each kidney is about the size of your fist. They are located below your ribs, one on each side of your spine. CAUSES Infections are caused by microbes, which are microscopic organisms, including fungi, viruses, and bacteria. These organisms are so small that they can only be seen through a microscope. Bacteria are the microbes that most commonly cause UTIs. SYMPTOMS  Symptoms of UTIs  may vary by age and gender of the patient and by the location of the infection. Symptoms in young women typically include a frequent and intense urge to urinate and a painful, burning feeling in the bladder or urethra during urination. Older women and men are more likely to be tired, shaky, and weak and have muscle  aches and abdominal pain. A fever may mean the infection is in your kidneys. Other symptoms of a kidney infection include pain in your back or sides below the ribs, nausea, and vomiting. DIAGNOSIS To diagnose a UTI, your caregiver will ask you about your symptoms. Your caregiver will also ask you to provide a urine sample. The urine sample will be tested for bacteria and white blood cells. White blood cells are made by your body to help fight infection. TREATMENT  Typically, UTIs can be treated with medication. Because most UTIs are caused by a bacterial infection, they usually can be treated with the use of antibiotics. The choice of antibiotic and length of treatment depend on your symptoms and the type of bacteria causing your infection. HOME CARE INSTRUCTIONS  If you were prescribed antibiotics, take them exactly as your caregiver instructs you. Finish the medication even if you feel better after you have only taken some of the medication.  Drink enough water and fluids to keep your urine clear or pale yellow.  Avoid caffeine, tea, and carbonated beverages. They tend to irritate your bladder.  Empty your bladder often. Avoid holding urine for long periods of time.  Empty your bladder before and after sexual intercourse.  After a bowel movement, women should cleanse from front to back. Use each tissue only once. SEEK MEDICAL CARE IF:   You have back pain.  You develop a fever.  Your symptoms do not begin to resolve within 3 days. SEEK IMMEDIATE MEDICAL CARE IF:   You have severe back pain or lower abdominal pain.  You develop chills.  You have nausea or vomiting.  You have continued burning or discomfort with urination. MAKE SURE YOU:   Understand these instructions.  Will watch your condition.  Will get help right away if you are not doing well or get worse.   This information is not intended to replace advice given to you by your health care provider. Make sure you  discuss any questions you have with your health care provider.   Document Released: 06/02/2005 Document Revised: 05/14/2015 Document Reviewed: 10/01/2011 Elsevier Interactive Patient Education 2016 [employer]  Nitrofurantoin tablets or capsules What is this medicine? NITROFURANTOIN (nye troe fyoor AN toyn) is an antibiotic. It is used to treat urinary tract infections. This medicine may be used for other purposes; ask your health care provider or pharmacist if you have questions. What should I tell my health care provider before I take this medicine? They need to know if you have any of these conditions: -anemia -diabetes -glucose-6-phosphate dehydrogenase deficiency -kidney disease -liver disease -lung disease -other chronic illness -an unusual or allergic reaction to nitrofurantoin, other antibiotics, other medicines, foods, dyes or preservatives -pregnant or trying to get pregnant -breast-feeding How should I use this medicine? Take this medicine by mouth with a glass of water. Follow the directions on the prescription label. Take this medicine with food or milk. Take your doses at regular intervals. Do not take your medicine more often than directed. Do not stop taking except on your doctor's advice. Talk to your pediatrician regarding the use of this medicine in children. While this drug  may be prescribed for selected conditions, precautions do apply. Overdosage: If you think you have taken too much of this medicine contact a poison control center or emergency room at once. NOTE: This medicine is only for you. Do not share this medicine with others. What if I miss a dose? If you miss a dose, take it as soon as you can. If it is almost time for your next dose, take only that dose. Do not take double or extra doses. What may interact with this medicine? -antacids containing magnesium trisilicate -probenecid -quinolone antibiotics like ciprofloxacin, lomefloxacin, norfloxacin and  ofloxacin -sulfinpyrazone This list may not describe all possible interactions. Give your health care provider a list of all the medicines, herbs, non-prescription drugs, or dietary supplements you use. Also tell them if you smoke, drink alcohol, or use illegal drugs. Some items may interact with your medicine. What should I watch for while using this medicine? Tell your doctor or health care professional if your symptoms do not improve or if you get new symptoms. Drink several glasses of water a day. If you are taking this medicine for a long time, visit your doctor for regular checks on your progress. If you are diabetic, you may get a false positive result for sugar in your urine with certain brands of urine tests. Check with your doctor. What side effects may I notice from receiving this medicine? Side effects that you should report to your doctor or health care professional as soon as possible: -allergic reactions like skin rash or hives, swelling of the face, lips, or tongue -chest pain -cough -difficulty breathing -dizziness, drowsiness -fever or infection -joint aches or pains -pale or blue-tinted skin -redness, blistering, peeling or loosening of the skin, including inside the mouth -tingling, burning, pain, or numbness in hands or feet -unusual bleeding or bruising -unusually weak or tired -yellowing of eyes or skin Side effects that usually do not require medical attention (report to your doctor or health care professional if they continue or are bothersome): -dark urine -diarrhea -headache -loss of appetite -nausea or vomiting -temporary hair loss This list may not describe all possible side effects. Call your doctor for medical advice about side effects. You may report side effects to FDA at 1-800-FDA-1088. Where should I keep my medicine? Keep out of the reach of children. Store at room temperature between 15 and 30 degrees C (59 and 86 degrees F). Protect from light.  Throw away any unused medicine after the expiration date. NOTE: This sheet is a summary. It may not cover all possible information. If you have questions about this medicine, talk to your doctor, pharmacist, or health care provider.    2016, [employer] Standard. (2008-03-13 15:56:47)  Doxycycline tablets or capsules What is this medicine? DOXYCYCLINE (dox i SYE kleen) is a tetracycline antibiotic. It kills certain bacteria or stops their growth. It is used to treat many kinds of infections, like dental, skin, respiratory, and urinary tract infections. It also treats acne, Lyme disease, malaria, and certain sexually transmitted infections. This medicine may be used for other purposes; ask your health care provider or pharmacist if you have questions. What should I tell my health care provider before I take this medicine? They need to know if you have any of these conditions: -liver disease -long exposure to sunlight like working outdoors -stomach problems like colitis -an unusual or allergic reaction to doxycycline, tetracycline antibiotics, other medicines, foods, dyes, or preservatives -pregnant or trying to get pregnant -breast-feeding How should I  use this medicine? Take this medicine by mouth with a full glass of water. Follow the directions on the prescription label. It is best to take this medicine without food, but if it upsets your stomach take it with food. Take your medicine at regular intervals. Do not take your medicine more often than directed. Take all of your medicine as directed even if you think you are better. Do not skip doses or stop your medicine early. Talk to your pediatrician regarding the use of this medicine in children. While this drug may be prescribed for selected conditions, precautions do apply. Overdosage: If you think you have taken too much of this medicine contact a poison control center or emergency room at once. NOTE: This medicine is only for you. Do not  share this medicine with others. What if I miss a dose? If you miss a dose, take it as soon as you can. If it is almost time for your next dose, take only that dose. Do not take double or extra doses. What may interact with this medicine? -antacids -barbiturates -birth control pills -bismuth subsalicylate -carbamazepine -methoxyflurane -other antibiotics -phenytoin -vitamins that contain iron -warfarin This list may not describe all possible interactions. Give your health care provider a list of all the medicines, herbs, non-prescription drugs, or dietary supplements you use. Also tell them if you smoke, drink alcohol, or use illegal drugs. Some items may interact with your medicine. What should I watch for while using this medicine? Tell your doctor or health care professional if your symptoms do not improve. Do not treat diarrhea with over the counter products. Contact your doctor if you have diarrhea that lasts more than 2 days or if it is severe and watery. Do not take this medicine just before going to bed. It may not dissolve properly when you lay down and can cause pain in your throat. Drink plenty of fluids while taking this medicine to also help reduce irritation in your throat. This medicine can make you more sensitive to the sun. Keep out of the sun. If you cannot avoid being in the sun, wear protective clothing and use sunscreen. Do not use sun lamps or tanning beds/booths. Birth control pills may not work properly while you are taking this medicine. Talk to your doctor about using an extra method of birth control. If you are being treated for a sexually transmitted infection, avoid sexual contact until you have finished your treatment. Your sexual partner may also need treatment. Avoid antacids, aluminum, calcium, magnesium, and iron products for 4 hours before and 2 hours after taking a dose of this medicine. If you are using this medicine to prevent malaria, you should still  protect yourself from contact with mosquitos. Stay in screened-in areas, use mosquito nets, keep your body covered, and use an insect repellent. What side effects may I notice from receiving this medicine? Side effects that you should report to your doctor or health care professional as soon as possible: -allergic reactions like skin rash, itching or hives, swelling of the face, lips, or tongue -difficulty breathing -fever -itching in the rectal or genital area -pain on swallowing -redness, blistering, peeling or loosening of the skin, including inside the mouth -severe stomach pain or cramps -unusual bleeding or bruising -unusually weak or tired -yellowing of the eyes or skin Side effects that usually do not require medical attention (report to your doctor or health care professional if they continue or are bothersome): -diarrhea -loss of appetite -nausea, vomiting This  list may not describe all possible side effects. Call your doctor for medical advice about side effects. You may report side effects to FDA at 1-800-FDA-1088. Where should I keep my medicine? Keep out of the reach of children. Store at room temperature, below 30 degrees C (86 degrees F). Protect from light. Keep container tightly closed. Throw away any unused medicine after the expiration date. Taking this medicine after the expiration date can make you seriously ill. NOTE: This sheet is a summary. It may not cover all possible information. If you have questions about this medicine, talk to your doctor, pharmacist, or health care provider.    2016, [employer] Standard. (2014-12-13 12:10:28)  Tramadol tablets What is this medicine? TRAMADOL (TRA ma dole) is a pain reliever. It is used to treat moderate to severe pain in adults. This medicine may be used for other purposes; ask your health care provider or pharmacist if you have questions. What should I tell my health care provider before I take this medicine? They  need to know if you have any of these conditions: -brain tumor -depression -drug abuse or addiction -head injury -if you frequently drink alcohol containing drinks -kidney disease or trouble passing urine -liver disease -lung disease, asthma, or breathing problems -seizures or epilepsy -suicidal thoughts, plans, or attempt; a previous suicide attempt by you or a family member -an unusual or allergic reaction to tramadol, codeine, other medicines, foods, dyes, or preservatives -pregnant or trying to get pregnant -breast-feeding How should I use this medicine? Take this medicine by mouth with a full glass of water. Follow the directions on the prescription label. If the medicine upsets your stomach, take it with food or milk. Do not take more medicine than you are told to take. Talk to your pediatrician regarding the use of this medicine in children. Special care may be needed. Overdosage: If you think you have taken too much of this medicine contact a poison control center or emergency room at once. NOTE: This medicine is only for you. Do not share this medicine with others. What if I miss a dose? If you miss a dose, take it as soon as you can. If it is almost time for your next dose, take only that dose. Do not take double or extra doses. What may interact with this medicine? Do not take this medicine with any of the following medications: -MAOIs like Carbex, Eldepryl, Marplan, Nardil, and Parnate This medicine may also interact with the following medications: -alcohol or medicines that contain alcohol -antihistamines -benzodiazepines -bupropion -carbamazepine or oxcarbazepine -clozapine -cyclobenzaprine -digoxin -furazolidone -linezolid -medicines for depression, anxiety, or psychotic disturbances -medicines for migraine headache like almotriptan, eletriptan, frovatriptan, naratriptan, rizatriptan, sumatriptan, zolmitriptan -medicines for pain like pentazocine, buprenorphine,  butorphanol, meperidine, nalbuphine, and propoxyphene -medicines for sleep -muscle relaxants -naltrexone -phenobarbital -phenothiazines like perphenazine, thioridazine, chlorpromazine, mesoridazine, fluphenazine, prochlorperazine, promazine, and trifluoperazine -procarbazine -warfarin This list may not describe all possible interactions. Give your health care provider a list of all the medicines, herbs, non-prescription drugs, or dietary supplements you use. Also tell them if you smoke, drink alcohol, or use illegal drugs. Some items may interact with your medicine. What should I watch for while using this medicine? Tell your doctor or health care professional if your pain does not go away, if it gets worse, or if you have new or a different type of pain. You may develop tolerance to the medicine. Tolerance means that you will need a higher dose of the medicine for pain relief.  Tolerance is normal and is expected if you take this medicine for a long time. Do not suddenly stop taking your medicine because you may develop a severe reaction. Your body becomes used to the medicine. This does NOT mean you are addicted. Addiction is a behavior related to getting and using a drug for a non-medical reason. If you have pain, you have a medical reason to take pain medicine. Your doctor will tell you how much medicine to take. If your doctor wants you to stop the medicine, the dose will be slowly lowered over time to avoid any side effects. You may get drowsy or dizzy. Do not drive, use machinery, or do anything that needs mental alertness until you know how this medicine affects you. Do not stand or sit up quickly, especially if you are an older patient. This reduces the risk of dizzy or fainting spells. Alcohol can increase or decrease the effects of this medicine. Avoid alcoholic drinks. You may have constipation. Try to have a bowel movement at least every 2 to 3 days. If you do not have a bowel movement for 3  days, call your doctor or health care professional. Your mouth may get dry. Chewing sugarless gum or sucking hard candy, and drinking plenty of water may help. Contact your doctor if the problem does not go away or is severe. What side effects may I notice from receiving this medicine? Side effects that you should report to your doctor or health care professional as soon as possible: -allergic reactions like skin rash, itching or hives, swelling of the face, lips, or tongue -breathing difficulties, wheezing -confusion -itching -light headedness or fainting spells -redness, blistering, peeling or loosening of the skin, including inside the mouth -seizures Side effects that usually do not require medical attention (report to your doctor or health care professional if they continue or are bothersome): -constipation -dizziness -drowsiness -headache -nausea, vomiting This list may not describe all possible side effects. Call your doctor for medical advice about side effects. You may report side effects to FDA at 1-800-FDA-1088. Where should I keep my medicine? Keep out of the reach of children. This medicine may cause accidental overdose and death if it taken by other adults, children, or pets. Mix any unused medicine with a substance like cat litter or coffee grounds. Then throw the medicine away in a sealed container like a sealed bag or a coffee can with a lid. Do not use the medicine after the expiration date. Store at room temperature between 15 and 30 degrees C (59 and 86 degrees F). NOTE: This sheet is a summary. It may not cover all possible information. If you have questions about this medicine, talk to your doctor, pharmacist, or health care provider.    2016, [employer] Standard. (2013-10-19 15:42:09)

## 2015-07-23 NOTE — ED Notes (Signed)
Pt has large abscess under R underarm, been there for about a week and a half, no drainage noted, pt also c/o a recurrent UTI.

## 2015-07-23 NOTE — ED Provider Notes (Signed)
CSN: [id number]     Arrival date & time 07/23/15  0515 History   First MD Initiated Contact with Patient 07/23/15 0532     Chief Complaint  Patient presents with  . Abscess  . Recurrent UTI     (Consider location/radiation/quality/duration/timing/severity/associated sxs/prior Treatment) Patient is a 34 y.o. female presenting with abscess. The history is provided by the patient.  Abscess She complains of an abscess in her right axilla. She has history of having had multiple abscesses but they usually drain spontaneously. In spite of applying warm compresses, this was not drained and is gotten bigger. She rates pain at 6/10. It is been developing over about the last 2 weeks. She is also complaining of a urinary tract infection. She has frequent urinary tract infections and the symptoms are usually just low back pain. She denies urinary urgency, frequency, tenesmus, dysuria but does not get these with her urinary tract infections. She has had subjective fever and chills but no sweats.  Past Medical History  Diagnosis Date  . UTI (lower urinary tract infection)   . Sciatic nerve pain    Past Surgical History  Procedure Laterality Date  . Cholecystectomy    . Appendectomy    . Endometrial ablation    . Tonsillectomy    . Knee arthroscopy    . Shoulder surgery Left    Family History  Problem Relation Age of Onset  . Cancer Maternal Grandmother     colon   Social History  Substance Use Topics  . Smoking status: Current Every Day Smoker -- 0.50 packs/day  . Smokeless tobacco: None  . Alcohol Use: No   OB History    Gravida Para Term Preterm AB TAB SAB Ectopic Multiple Living       Review of Systems  All other systems reviewed and are negative.     Allergies  Ibuprofen; Vicodin; and Citrus  Home Medications   Prior to Admission medications   Medication Sig Start Date End Date Taking? Authorizing Provider  albuterol (PROVENTIL HFA;VENTOLIN HFA) 108  (90 BASE) MCG/ACT inhaler Inhale 2-3 puffs into the lungs every 4 (four) hours as needed for wheezing or shortness of breath. 01/30/15   Bridgett Larsson, MD  ampicillin (PRINCIPEN) 500 MG capsule Take 1 capsule (500 mg total) by mouth 3 (three) times daily. Patient not taking: Reported on 05/28/2015 01/16/15   Willodean Rosenthal, MD  Aspirin-Acetaminophen-Caffeine (GOODYS EXTRA STRENGTH) 520-260-32.5 MG PACK Take 1 packet by mouth 3 (three) times daily as needed (for headache).    Historical Provider, MD  benzonatate (TESSALON) 100 MG capsule Take 1 capsule (100 mg total) by mouth 3 (three) times daily as needed for cough. Patient not taking: Reported on 05/28/2015 01/30/15   Bridgett Larsson, MD  cephALEXin (KEFLEX) 500 MG capsule Take 1 capsule (500 mg total) by mouth 3 (three) times daily. Patient not taking: Reported on 05/28/2015 04/26/15   Francee Piccolo, PA-C  ciprofloxacin (CIPRO) 500 MG tablet Take 1 tablet (500 mg total) by mouth 2 (two) times daily. Patient not taking: Reported on 05/28/2015 12/30/14   Allie Bossier, MD  cyclobenzaprine (FLEXERIL) 10 MG tablet Take 1 tablet (10 mg total) by mouth 2 (two) times daily as needed for muscle spasms. Patient not taking: Reported on 05/28/2015 04/26/15   Francee Piccolo, PA-C  cyclobenzaprine (FLEXERIL) 5 MG tablet Take 1 tablet (5 mg total) by mouth 3 (three) times daily. Patient not taking: Reported on  05/28/2015 11/10/14   Earley Favor, NP  ibuprofen (ADVIL,MOTRIN) 200 MG tablet Take 4 tablets (800 mg total) by mouth every 6 (six) hours as needed for moderate pain. Patient not taking: Reported on 05/28/2015 11/10/14   Earley Favor, NP  nitrofurantoin, macrocrystal-monohydrate, (MACROBID) 100 MG capsule Take 1 capsule (100 mg total) by mouth 2 (two) times daily. 05/28/15   Barrett Henle, PA-C  predniSONE (DELTASONE) 20 MG tablet Take 2 tablets (40 mg total) by mouth daily. 04/26/15   Francee Piccolo, PA-C  predniSONE (DELTASONE) 50 MG tablet Take  one tab ( ) by mouth once daily for 5 days Patient not taking: Reported on 05/28/2015 01/30/15   Bridgett Larsson, MD   BP 146/95 mmHg  Pulse 99  Temp(Src) 98.7 F (37.1 C) (Oral)  Resp 18  Ht  (1.676 m)  Wt 340 lb (154.223 kg)  BMI 54.90 kg/m2  SpO2 99%  LMP 07/17/2015 Physical Exam  Nursing note and vitals reviewed.  Morbidly obese 34 year old female, resting comfortably and in no acute distress. Vital signs are significant for hypertension. Oxygen saturation is 99%, which is normal. Head is normocephalic and atraumatic. PERRLA, EOMI. Oropharynx is clear. Neck is nontender and supple without adenopathy or JVD. Back is nontender in the midline. There is mild left CVA tenderness. Lungs are clear without rales, wheezes, or rhonchi. Chest is nontender. Heart has regular rate and rhythm without murmur. Abdomen is soft, flat, nontender without masses or hepatosplenomegaly and peristalsis is normoactive. Extremities have no cyanosis or edema, full range of motion is present. Skin is warm and dry without rash. Raised, indurated areas are present in the right axilla with no definite fluctuance. There are moderately tender. Neurologic: Mental status is normal, cranial nerves are intact, there are no motor or sensory deficits.  ED Course  Procedures (including critical care time) INCISION AND DRAINAGE Performed by: ZOXWR,UEAVW Consent: Verbal consent obtained. Risks and benefits: risks, benefits and alternatives were discussed Type: abscess  Body area: Right axilla  Anesthesia: local infiltration  Incision was made with a scalpel.  Local anesthetic: lidocaine 1% without epinephrine  Anesthetic total: 4 ml  Complexity: complex Blunt dissection to break up loculations  Drainage: purulent, thick  Drainage amount: Moderate   Packing material: None   Patient tolerance: Patient tolerated the procedure well with no immediate complications.     Labs Review Results for  orders placed or performed during the hospital encounter of 07/23/15  Urinalysis, Routine w reflex microscopic (not at [facility])  Result Value Ref Range   Color, Urine YELLOW YELLOW   APPearance TURBID (A) CLEAR   Specific Gravity, Urine 1.019 1.005 - 1.030   pH 5.5 5.0 - 8.0   Glucose, UA NEGATIVE NEGATIVE mg/dL   Hgb urine dipstick MODERATE (A) NEGATIVE   Bilirubin Urine NEGATIVE NEGATIVE   Ketones, ur NEGATIVE NEGATIVE mg/dL   Protein, ur NEGATIVE NEGATIVE mg/dL   Nitrite NEGATIVE NEGATIVE   Leukocytes, UA LARGE (A) NEGATIVE  Urine microscopic-add on  Result Value Ref Range   Squamous Epithelial / LPF 6-30 (A) NONE SEEN   WBC, UA TOO NUMEROUS TO COUNT 0 - 5 WBC/hpf   RBC / HPF 6-30 0 - 5 RBC/hpf   Bacteria, UA MANY (A) NONE SEEN   I have personally reviewed and evaluated lab results as part of my medical decision-making.    MDM   Final diagnoses:  Abscess of right axilla  Urinary tract infection without hematuria, site unspecified    Abscess  in the right axilla which is probably part of hidradenitis. Fever. Left CVA tenderness. Possible UTI. Urine is been sent for urinalysis. Old records are reviewed confirming multiple ED visits for any tract infections.  Urinalysis confirms presence of urinary tract infection. According to old records, last UTI was from enterococcus sensitive to nitrofurantoin. She also has had positive cultures for MRSA. It is assumed that her abscess is secondary to MRSA. She is discharged with prescription for doxycycline. She is continuing to have pain at the incision site and is given a prescription for tramadol.   Dione Boozeavid Alahia Whicker, MD 07/23/15 [phone]

## 2015-07-24 ENCOUNTER — Emergency Department (HOSPITAL_COMMUNITY)
Admission: EM | Admit: 2015-07-24 | Discharge: 2015-07-24 | Disposition: A | Discharge status: Home / Self Care | Payer: BLUE CROSS/BLUE SHIELD | Attending: Emergency Medicine | Admitting: Emergency Medicine

## 2015-07-24 ENCOUNTER — Encounter (HOSPITAL_COMMUNITY): Payer: Self-pay | Admitting: Emergency Medicine

## 2015-07-24 DIAGNOSIS — F17200 Nicotine dependence, unspecified, uncomplicated: Secondary | ICD-10-CM | POA: Insufficient documentation

## 2015-07-24 DIAGNOSIS — L03111 Cellulitis of right axilla: Secondary | ICD-10-CM | POA: Diagnosis present

## 2015-07-24 DIAGNOSIS — Z87440 Personal history of urinary (tract) infections: Secondary | ICD-10-CM | POA: Diagnosis not present

## 2015-07-24 DIAGNOSIS — Z79899 Other long term (current) drug therapy: Secondary | ICD-10-CM | POA: Diagnosis not present

## 2015-07-24 MED ORDER — DOXYCYCLINE HYCLATE 100 MG PO TABS
100.0000 mg | ORAL_TABLET | Freq: Once | ORAL | Status: AC
  Administered 2015-07-24: 100 mg via ORAL
  Filled 2015-07-24: qty 1

## 2015-07-24 MED ORDER — HYDROCODONE-ACETAMINOPHEN 5-325 MG PO TABS: 1.0000 | ORAL_TABLET | Freq: Four times a day (QID) | ORAL | Status: DC | PRN

## 2015-07-24 MED ORDER — NITROFURANTOIN MONOHYD MACRO 100 MG PO CAPS
100.0000 mg | ORAL_CAPSULE | Freq: Once | ORAL | Status: AC
  Administered 2015-07-24: 100 mg via ORAL
  Filled 2015-07-24: qty 1

## 2015-07-24 MED ORDER — HYDROCODONE-ACETAMINOPHEN 5-325 MG PO TABS
1.0000 | ORAL_TABLET | Freq: Once | ORAL | Status: AC
  Administered 2015-07-24: 1 via ORAL
  Filled 2015-07-24: qty 1

## 2015-07-24 NOTE — ED Notes (Signed)
Pt here for re-check of right axilla abscess.  Pt seen yesterday for same and had I&D performed.  No packing placed.  Pt advises it isn't draining any and remains swollen and painful.

## 2015-07-24 NOTE — ED Notes (Signed)
Pt c/o abscess to R axillary area, pt states he had area [facility] & [facility]. Pt states she has had very little drainage, and no relief.

## 2015-07-24 NOTE — ED Provider Notes (Signed)
CSN: [id number]     Arrival date & time 07/24/15  0612 History   First MD Initiated Contact with Patient 07/24/15 0730     Chief Complaint  Patient presents with  . Abscess     (Consider location/radiation/quality/duration/timing/severity/associated sxs/prior Treatment) Patient is a 34 y.o. female presenting with abscess.  Abscess Abscess location: right axilla. Size:  3x3 Abscess quality: induration, painful, redness and warmth   Abscess quality: no fluctuance, no itching and not weeping   Red streaking: no   Progression:  Unchanged Pain details:    Quality:  Hot   Severity:  Moderate   Timing:  Constant Chronicity:  Recurrent Context: not diabetes and not immunosuppression   Associated symptoms: no fatigue, no fever and no headaches     Past Medical History  Diagnosis Date  . UTI (lower urinary tract infection)   . Sciatic nerve pain    Past Surgical History  Procedure Laterality Date  . Cholecystectomy    . Appendectomy    . Endometrial ablation    . Tonsillectomy    . Knee arthroscopy    . Shoulder surgery Left    Family History  Problem Relation Age of Onset  . Cancer Maternal Grandmother     colon   Social History  Substance Use Topics  . Smoking status: Current Every Day Smoker -- 0.50 packs/day  . Smokeless tobacco: None  . Alcohol Use: No   OB History    Gravida Para Term Preterm AB TAB SAB Ectopic Multiple Living       Review of Systems  Constitutional: Negative for fever and fatigue.  HENT: Negative for congestion and facial swelling.   Eyes: Negative for discharge and redness.  Respiratory: Negative for cough and shortness of breath.   Cardiovascular: Negative for chest pain.  Gastrointestinal: Negative for abdominal pain and abdominal distention.  Endocrine: Negative for polydipsia.  Genitourinary: Negative for dysuria.  Musculoskeletal: Negative for back pain.  Skin: Positive for rash and wound.  Neurological:  Negative for headaches.  All other systems reviewed and are negative.     Allergies  Ibuprofen; Vicodin; and Citrus  Home Medications   Prior to Admission medications   Medication Sig Start Date End Date Taking? Authorizing Provider  albuterol (PROVENTIL HFA;VENTOLIN HFA) 108 (90 BASE) MCG/ACT inhaler Inhale 2-3 puffs into the lungs every 4 (four) hours as needed for wheezing or shortness of breath. 01/30/15   Bridgett Larsson, MD  Aspirin-Acetaminophen-Caffeine (GOODYS EXTRA STRENGTH) 520-260-32.5 MG PACK Take 1 packet by mouth 3 (three) times daily as needed (for headache).    Historical Provider, MD  doxycycline (VIBRAMYCIN) 100 MG capsule Take 1 capsule (100 mg total) by mouth 2 (two) times daily. 07/23/15   Dione Booze, MD  HYDROcodone-acetaminophen (NORCO/VICODIN) 5-325 MG tablet Take 1 tablet by mouth every 6 (six) hours as needed for severe pain. 07/24/15   Marily Memos, MD  nitrofurantoin, macrocrystal-monohydrate, (MACROBID) 100 MG capsule Take 1 capsule (100 mg total) by mouth 2 (two) times daily. X 7 days 07/23/15   Dione Booze, MD  traMADol (ULTRAM) 50 MG tablet Take 1 tablet (50 mg total) by mouth every 6 (six) hours as needed. 07/23/15   Dione Booze, MD   BP 117/67 mmHg  Pulse 80  Temp(Src) 97.9 F (36.6 C) (Oral)  Resp 18  Ht  (1.676 m)  Wt 340 lb (154.223 kg)  BMI 54.90 kg/m2  SpO2 97%  LMP 07/17/2015  Physical Exam  Constitutional: She is oriented to person, place, and time. She appears well-developed and well-nourished.  HENT:  Head: Normocephalic and atraumatic.  Neck: Normal range of motion.  Cardiovascular: Normal rate and regular rhythm.   Pulmonary/Chest: Effort normal and breath sounds normal. No stridor. No respiratory distress.  Abdominal: She exhibits no distension.  Neurological: She is alert and oriented to person, place, and time.  Skin: Rash (right axilla with redness, warmth, ttp, induration. no fluctuance, drainage or obvious fluid collections.  previous I&D site open still. ) noted.  Nursing note and vitals reviewed.   ED Course  Procedures (including critical care time) Labs Review Labs Reviewed - No data to display  Imaging Review No results found. I have personally reviewed and evaluated these images and lab results as part of my medical decision-making.   EKG Interpretation None      MDM   Final diagnoses:  Cellulitis of right axilla   Abscess I&D yesterday with continued pain and swelling and warmth in that area. Exam as above. No evidence of retention relation of fluids is no fluctuance or ballotable. Patient states that the the pain and tenderness has not gotten better so she was concerned. I reassured her there is no obvious fluid collections and she has surrounding cellulitis that was likely the cause of her symptoms. She's not been able to start her antibiotics yet but because she gets paid tomorrow. I will give her a dose of antibiotics here and some pain medication as well. I encouraged her to try to borrow the money from somebody and then hopefully pain and back tomorrow and she is pain to this infection does not get out of control. She will also use warm compresses and other adjuvant measures to help. Will return here if there is fluid collection without drainage, worsening of area of induration or other systemic symptoms.     Marily MemosJason Chanequa Spees, MD 07/24/15 [phone]

## 2015-07-25 LAB — URINE CULTURE

## 2015-10-11 ENCOUNTER — Emergency Department (HOSPITAL_COMMUNITY)
Admission: EM | Admit: 2015-10-11 | Discharge: 2015-10-11 | Disposition: A | Discharge status: Home / Self Care | Payer: BLUE CROSS/BLUE SHIELD | Attending: Emergency Medicine | Admitting: Emergency Medicine

## 2015-10-11 ENCOUNTER — Encounter (HOSPITAL_COMMUNITY): Payer: Self-pay | Admitting: *Deleted

## 2015-10-11 DIAGNOSIS — Z792 Long term (current) use of antibiotics: Secondary | ICD-10-CM | POA: Diagnosis not present

## 2015-10-11 DIAGNOSIS — Z87440 Personal history of urinary (tract) infections: Secondary | ICD-10-CM | POA: Insufficient documentation

## 2015-10-11 DIAGNOSIS — Z79899 Other long term (current) drug therapy: Secondary | ICD-10-CM | POA: Insufficient documentation

## 2015-10-11 DIAGNOSIS — F17210 Nicotine dependence, cigarettes, uncomplicated: Secondary | ICD-10-CM | POA: Insufficient documentation

## 2015-10-11 DIAGNOSIS — G8929 Other chronic pain: Secondary | ICD-10-CM | POA: Insufficient documentation

## 2015-10-11 DIAGNOSIS — M545 Low back pain, unspecified: Secondary | ICD-10-CM

## 2015-10-11 MED ORDER — KETOROLAC TROMETHAMINE 30 MG/ML IJ SOLN
30.0000 mg | Freq: Once | INTRAMUSCULAR | Status: AC
  Administered 2015-10-11: 30 mg via INTRAMUSCULAR
  Filled 2015-10-11: qty 1

## 2015-10-11 MED ORDER — CYCLOBENZAPRINE HCL 10 MG PO TABS: 10.0000 mg | ORAL_TABLET | Freq: Two times a day (BID) | ORAL | Status: DC | PRN

## 2015-10-11 NOTE — ED Provider Notes (Signed)
CSN: [id number]     Arrival date & time 10/11/15  0618 History   First MD Initiated Contact with Patient 10/11/15 [phone]     Chief Complaint  Patient presents with  . Back Pain    HPI   35 year old female presents today with back pain. Patient has a significant past medical history of chronic right lower back pain. She reports that symptoms are off and on, exacerbated by certain movements of the torso and lower extremity. She reports that she is asymptomatic 5 days ago when she sneezed causing excruciating pain to the right lower aspect of her back with radiation of sharp pain down her right leg. She reports spasming to the right lower back that lasted approximately 30 seconds. She denies any remaining lower extremity symptoms, denies loss of sensation strength or motor function. She has no red flags for back pain. Patient reports this is typical of her back pain exacerbation. Patient wonders if she has a urinary tract infection as she's had back pain before and resulting positive urinalysis.  patient denies any urinary complaints including frequency, change in color clarity or characteristics.   Past Medical History  Diagnosis Date  . UTI (lower urinary tract infection)   . Sciatic nerve pain    Past Surgical History  Procedure Laterality Date  . Cholecystectomy    . Appendectomy    . Endometrial ablation    . Tonsillectomy    . Knee arthroscopy    . Shoulder surgery Left    Family History  Problem Relation Age of Onset  . Cancer Maternal Grandmother     colon   Social History  Substance Use Topics  . Smoking status: Current Every Day Smoker -- 0.50 packs/day    Types: Cigarettes  . Smokeless tobacco: None  . Alcohol Use: No   OB History    Gravida Para Term Preterm AB TAB SAB Ectopic Multiple Living       Review of Systems  All other systems reviewed and are negative.   Allergies  Ibuprofen; Vicodin; and Citrus  Home Medications   Prior to  Admission medications   Medication Sig Start Date End Date Taking? Authorizing Provider  albuterol (PROVENTIL HFA;VENTOLIN HFA) 108 (90 BASE) MCG/ACT inhaler Inhale 2-3 puffs into the lungs every 4 (four) hours as needed for wheezing or shortness of breath. 01/30/15   Bridgett Larsson, MD  Aspirin-Acetaminophen-Caffeine (GOODYS EXTRA STRENGTH) 520-260-32.5 MG PACK Take 1 packet by mouth 3 (three) times daily as needed (for headache).    Historical Provider, MD  cyclobenzaprine (FLEXERIL) 10 MG tablet Take 1 tablet (10 mg total) by mouth 2 (two) times daily as needed for muscle spasms. 10/11/15   Eyvonne Mechanic, PA-C  doxycycline (VIBRAMYCIN) 100 MG capsule Take 1 capsule (100 mg total) by mouth 2 (two) times daily. 07/23/15   Dione Booze, MD  HYDROcodone-acetaminophen (NORCO/VICODIN) 5-325 MG tablet Take 1 tablet by mouth every 6 (six) hours as needed for severe pain. 07/24/15   Marily Memos, MD  nitrofurantoin, macrocrystal-monohydrate, (MACROBID) 100 MG capsule Take 1 capsule (100 mg total) by mouth 2 (two) times daily. X 7 days 07/23/15   Dione Booze, MD  traMADol (ULTRAM) 50 MG tablet Take 1 tablet (50 mg total) by mouth every 6 (six) hours as needed. 07/23/15   Dione Booze, MD   BP 144/114 mmHg  Pulse 88  Temp(Src) 97.8 F (36.6 C) (Oral)  Resp 20  SpO2 100%  LMP 09/28/2015   Physical Exam  Constitutional: She is oriented to person, place, and time. She appears well-developed and well-nourished. No distress.  HENT:  Head: Normocephalic and atraumatic.  Eyes: Conjunctivae are normal. Pupils are equal, round, and reactive to light. Right eye exhibits no discharge. Left eye exhibits no discharge. No scleral icterus.  Neck: Normal range of motion. Neck supple. No JVD present. No tracheal deviation present.  Pulmonary/Chest: Effort normal. No stridor.  Musculoskeletal: Normal range of motion. She exhibits tenderness. She exhibits no edema.  No C, T, or L spine tenderness to palpation. No obvious  signs of trauma, deformity, infection, step-offs. Lung expansion normal. No scoliosis or kyphosis. Bilateral lower extremity strength 5 out of 5, sensation grossly intact, patellar reflexes 2+, pedal pulses 2+, Refill less than 3 seconds.  Tenderness to palpation of the right lower lumbar soft tissue  Straight leg negative Ambulates with minimal difficulty   Neurological: She is alert and oriented to person, place, and time. Coordination normal.  Skin: Skin is warm and dry. She is not diaphoretic.  Psychiatric: She has a normal mood and affect. Her behavior is normal. Judgment and thought content normal.  Nursing note and vitals reviewed.   ED Course  Procedures (including critical care time) Labs Review Labs Reviewed - No data to display  Imaging Review No results found. I have personally reviewed and evaluated these images and lab results as part of my medical decision-making.   EKG Interpretation None      MDM   Final diagnoses:  Right-sided low back pain without sciatica    Labs:  Imaging:  Consults:  Therapeutics:  Discharge Meds:   Assessment/Plan: 35 year old female presents today with acute on chronic back pain. She has no red flags for back pain, ambulates with minimal difficulty. She was given Toradol here in the ED, discharged home with above medications. She is instructed to return immediately if any new or worsening signs or symptoms present, follow-up with her primary care or orthopedist for further evaluation if symptoms persist beyond 2 weeks.         Eyvonne Mechanic, PA-C 10/11/15 1610  April Palumbo, MD 10/11/15 [phone]

## 2015-10-11 NOTE — Discharge Instructions (Signed)

## 2015-10-11 NOTE — ED Notes (Signed)
Pt states that she has a known "pinched nerve" to her back; pt states that she sneezed on Tues and is having increase pain; pt states that she is having increase pain to her back, arms and legs; pt c/o tingling to rt toes and the tips of rt fingers; pt denies being incontinent of bowel or bladder; pt states that she has frequent UTI's and is concerned that she may have a UTI as well

## 2015-10-14 ENCOUNTER — Other Ambulatory Visit: Payer: Self-pay | Admitting: Sports Medicine

## 2015-10-14 DIAGNOSIS — M545 Low back pain: Secondary | ICD-10-CM

## 2015-10-14 DIAGNOSIS — R29898 Other symptoms and signs involving the musculoskeletal system: Secondary | ICD-10-CM

## 2015-10-18 ENCOUNTER — Inpatient Hospital Stay: Admission: RE | Admit: 2015-10-18 | Payer: Self-pay | Source: Ambulatory Visit

## 2015-12-20 ENCOUNTER — Encounter (HOSPITAL_COMMUNITY): Payer: Self-pay | Admitting: *Deleted

## 2015-12-20 ENCOUNTER — Emergency Department (HOSPITAL_COMMUNITY)
Admission: EM | Admit: 2015-12-20 | Discharge: 2015-12-20 | Disposition: A | Discharge status: Home / Self Care | Payer: BLUE CROSS/BLUE SHIELD | Attending: Emergency Medicine | Admitting: Emergency Medicine

## 2015-12-20 DIAGNOSIS — H578 Other specified disorders of eye and adnexa: Secondary | ICD-10-CM | POA: Diagnosis present

## 2015-12-20 DIAGNOSIS — Z87440 Personal history of urinary (tract) infections: Secondary | ICD-10-CM | POA: Insufficient documentation

## 2015-12-20 DIAGNOSIS — Z79899 Other long term (current) drug therapy: Secondary | ICD-10-CM | POA: Diagnosis not present

## 2015-12-20 DIAGNOSIS — Z8739 Personal history of other diseases of the musculoskeletal system and connective tissue: Secondary | ICD-10-CM | POA: Insufficient documentation

## 2015-12-20 DIAGNOSIS — H109 Unspecified conjunctivitis: Secondary | ICD-10-CM | POA: Insufficient documentation

## 2015-12-20 DIAGNOSIS — F17210 Nicotine dependence, cigarettes, uncomplicated: Secondary | ICD-10-CM | POA: Diagnosis not present

## 2015-12-20 MED ORDER — FLUORESCEIN SODIUM 1 MG OP STRP
2.0000 | ORAL_STRIP | Freq: Once | OPHTHALMIC | Status: AC
  Administered 2015-12-20: 2 via OPHTHALMIC
  Filled 2015-12-20: qty 2

## 2015-12-20 MED ORDER — ERYTHROMYCIN 5 MG/GM OP OINT: TOPICAL_OINTMENT | OPHTHALMIC | Status: DC

## 2015-12-20 MED ORDER — TETRACAINE HCL 0.5 % OP SOLN
1.0000 [drp] | Freq: Once | OPHTHALMIC | Status: AC
  Administered 2015-12-20: 1 [drp] via OPHTHALMIC
  Filled 2015-12-20: qty 2

## 2015-12-20 MED ORDER — HYDROXYZINE HCL 25 MG PO TABS: 25.0000 mg | ORAL_TABLET | Freq: Four times a day (QID) | ORAL | Status: DC | PRN

## 2015-12-20 NOTE — ED Notes (Signed)
Pt is here with right eye issues and where she works pink eye is going around and she says she itches all over from pollen allergy.

## 2015-12-20 NOTE — Discharge Instructions (Signed)
Use the medications as directed. Please follow up with ophthalmology next week if your symptoms persist. Return to the ER for new or worsening symptoms.

## 2015-12-20 NOTE — ED Notes (Signed)
Declined W/C at D/C and was escorted to lobby by RN. 

## 2015-12-20 NOTE — ED Provider Notes (Signed)
CSN: [id number]     Arrival date & time 12/20/15  1104 History  By signing my name below, I, Tiffany Conley, attest that this documentation has been prepared under the direction and in the presence of Seaton Hofmann Y Larue Lightner, [place]. Electronically Signed: Octavia Conley, ED Scribe. 12/20/2015. 12:03 PM.    Chief Complaint  Patient presents with  . Eye Problem      The history is provided by the patient. No language interpreter was used.   HPI Comments: Tiffany Conley is a 35 y.o. female who presents to the Emergency Department complaining of intermittent, gradual worsening, moderate, right eye redness with associate itching onset 3 days ago. Pt states she has been having yellow, stringy discharge in her right eye. She notes that both of her eyes itch but states she has a foreign body sensation in her right eye. Pt says she believes her symptoms are due to her allergies but she states that pink eye is going around at her job. She takes Claritin for her allergies but it has not given her any relief. Denies visual disturbances.   Past Medical History  Diagnosis Date  . UTI (lower urinary tract infection)   . Sciatic nerve pain    Past Surgical History  Procedure Laterality Date  . Cholecystectomy    . Appendectomy    . Endometrial ablation    . Tonsillectomy    . Knee arthroscopy    . Shoulder surgery Left    Family History  Problem Relation Age of Onset  . Cancer Maternal Grandmother     colon   Social History  Substance Use Topics  . Smoking status: Current Every Day Smoker -- 0.50 packs/day    Types: Cigarettes  . Smokeless tobacco: None  . Alcohol Use: No   OB History    Gravida Para Term Preterm AB TAB SAB Ectopic Multiple Living       Review of Systems  Eyes: Positive for discharge, redness and itching. Negative for visual disturbance.  All other systems reviewed and are negative.     Allergies  Ibuprofen; Vicodin; and Citrus  Home Medications    Prior to Admission medications   Medication Sig Start Date End Date Taking? Authorizing Provider  albuterol (PROVENTIL HFA;VENTOLIN HFA) 108 (90 BASE) MCG/ACT inhaler Inhale 2-3 puffs into the lungs every 4 (four) hours as needed for wheezing or shortness of breath. Patient not taking: Reported on 10/11/2015 01/30/15   Bridgett Larsson, MD  Aspirin-Acetaminophen-Caffeine (GOODYS EXTRA STRENGTH) 520-260-32.5 MG PACK Take 1 packet by mouth 3 (three) times daily as needed (for headache).    Historical Provider, MD  cyclobenzaprine (FLEXERIL) 10 MG tablet Take 1 tablet (10 mg total) by mouth 2 (two) times daily as needed for muscle spasms. 10/11/15   Eyvonne Mechanic, PA-C  doxycycline (VIBRAMYCIN) 100 MG capsule Take 1 capsule (100 mg total) by mouth 2 (two) times daily. Patient not taking: Reported on 10/11/2015 07/23/15   Dione Booze, MD  HYDROcodone-acetaminophen (NORCO/VICODIN) 5-325 MG tablet Take 1 tablet by mouth every 6 (six) hours as needed for severe pain. Patient not taking: Reported on 10/11/2015 07/24/15   Marily Memos, MD  nitrofurantoin, macrocrystal-monohydrate, (MACROBID) 100 MG capsule Take 1 capsule (100 mg total) by mouth 2 (two) times daily. X 7 days Patient not taking: Reported on 10/11/2015 07/23/15   Dione Booze, MD  traMADol (ULTRAM) 50 MG tablet Take 1 tablet (50 mg total) by mouth every 6 (  six) hours as needed. Patient not taking: Reported on 10/11/2015 07/23/15   Dione Boozeavid Glick, MD   Triage vitals: BP 133/57 mmHg  Pulse 89  Temp(Src) 98.1 F (36.7 C) (Oral)  Resp 18  SpO2 99%  LMP 11/23/2015 Physical Exam  Constitutional: She is oriented to person, place, and time. She appears well-developed and well-nourished.  HENT:  Head: Normocephalic.  Eyes: EOM are normal. Pupils are equal, round, and reactive to light.  Left eye unremarkable, right eye with mild conjunctival injection, no periorbital erythema or edema, EOM normal, PERRL, mild right eye watering, no purulent discharge visualized    Wood's lamp and slit lamp broken, unable to do exam with fluorescein stain.   Neck: Normal range of motion.  Pulmonary/Chest: Effort normal.  Abdominal: She exhibits no distension.  Musculoskeletal: Normal range of motion.  Neurological: She is alert and oriented to person, place, and time.  Psychiatric: She has a normal mood and affect.  Nursing note and vitals reviewed.   ED Course  Procedures  DIAGNOSTIC STUDIES: Oxygen Saturation is 99% on RA, normal by my interpretation.  COORDINATION OF CARE:  11:52 AM Discussed treatment plan with pt at bedside and pt agreed to plan. She was advised to follow up with an ophthamologist if symptoms persist.   Labs Review Labs Reviewed - No data to display  Imaging Review No results found. I have personally reviewed and evaluated these images and lab results as part of my medical decision-making.   EKG Interpretation None      MDM   Final diagnoses:  Conjunctivitis, right eye   Discussed with pt that most pink eye is viral or allergic, though in her case suspect viral. However she does report stringy purulent discharge at home. Given this report will cover for bacterial etiology with rx for erythromycin ointment. Instructed to f/u with ophtho if symptoms do not improve over the next couple of days. rx also given for vistaril to help with itching. ER return precautions given.   I personally performed the services described in this documentation, which was scribed in my presence. The recorded information has been reviewed and is accurate.   Carlene CoriaSerena Y Brentley Horrell, PA-C 12/20/15 1335  Margarita Grizzleanielle Ray, MD 12/23/15 [phone]

## 2016-03-15 ENCOUNTER — Telehealth (HOSPITAL_COMMUNITY): Payer: Self-pay | Admitting: *Deleted

## 2016-03-15 ENCOUNTER — Encounter (HOSPITAL_COMMUNITY): Payer: Self-pay | Admitting: *Deleted

## 2016-03-15 NOTE — Telephone Encounter (Signed)
Patient returned call. Advised patient was time for pap smear. Patient does have health insurance and is no longer eligible for [facility]. Gave [facility] phone to call and schedule appointment.

## 2016-03-15 NOTE — Telephone Encounter (Signed)
Telephoned patient at home # and left message to return call to [facility] 

## 2016-04-23 ENCOUNTER — Emergency Department (HOSPITAL_COMMUNITY)
Admission: EM | Admit: 2016-04-23 | Discharge: 2016-04-23 | Disposition: A | Discharge status: Home / Self Care | Payer: BLUE CROSS/BLUE SHIELD | Attending: Emergency Medicine | Admitting: Emergency Medicine

## 2016-04-23 ENCOUNTER — Encounter (HOSPITAL_COMMUNITY): Payer: Self-pay | Admitting: *Deleted

## 2016-04-23 DIAGNOSIS — F17210 Nicotine dependence, cigarettes, uncomplicated: Secondary | ICD-10-CM | POA: Insufficient documentation

## 2016-04-23 DIAGNOSIS — R519 Headache, unspecified: Secondary | ICD-10-CM

## 2016-04-23 DIAGNOSIS — R51 Headache: Secondary | ICD-10-CM | POA: Diagnosis not present

## 2016-04-23 MED ORDER — DIPHENHYDRAMINE HCL 50 MG/ML IJ SOLN
25.0000 mg | Freq: Once | INTRAMUSCULAR | Status: AC
  Administered 2016-04-23: 25 mg via INTRAVENOUS
  Filled 2016-04-23: qty 1

## 2016-04-23 MED ORDER — KETOROLAC TROMETHAMINE 15 MG/ML IJ SOLN
15.0000 mg | Freq: Once | INTRAMUSCULAR | Status: AC
  Administered 2016-04-23: 15 mg via INTRAVENOUS
  Filled 2016-04-23: qty 1

## 2016-04-23 MED ORDER — SODIUM CHLORIDE 0.9 % IV BOLUS (SEPSIS)
1000.0000 mL | Freq: Once | INTRAVENOUS | Status: AC
  Administered 2016-04-23: 1000 mL via INTRAVENOUS

## 2016-04-23 MED ORDER — METOCLOPRAMIDE HCL 5 MG/ML IJ SOLN
10.0000 mg | Freq: Once | INTRAMUSCULAR | Status: AC
  Administered 2016-04-23: 10 mg via INTRAVENOUS
  Filled 2016-04-23: qty 2

## 2016-04-23 MED ORDER — NAPROXEN 500 MG PO TABS
500.0000 mg | ORAL_TABLET | Freq: Two times a day (BID) | ORAL | 0 refills | Status: DC
Start: 2016-04-23 — End: 2016-07-09

## 2016-04-23 NOTE — ED Notes (Signed)
Gave pt Malawiturkey sandwich, applesauce and Sprite, per Efraim KaufmannMelissa I. - RN.

## 2016-04-23 NOTE — ED Notes (Signed)
Pt ambulatory to restroom with steady gait.

## 2016-04-23 NOTE — ED Triage Notes (Signed)
Pt here for headache since Tues.  Pt began vomiting with dizzy spells, R eye blurred vision since yesterday and R neck pain.

## 2016-04-23 NOTE — Discharge Instructions (Signed)
We saw you in the ER for headaches. All the labs and imaging are normal. We are not sure what is causing your headaches, however, there appears to be no evidence of infection, bleeds or tumors based on our exam and results.  Please return to the ER if the headache gets severe and in not improving, you have associated new one sided numbness, tingling, weakness or confusion, seizures, poor balance or poor vision. See a neurologist otherwise.

## 2016-04-23 NOTE — ED Provider Notes (Signed)
[facility]-EMERGENCY DEPT Provider Note   CSN: [id number] Arrival date & time: 04/23/16  1124     History   Chief Complaint Chief Complaint  Patient presents with  . Headache    HPI Tiffany Conley is a 35 y.o. female.  HPI  Pt with no severe medical hx comes in with cc of headaches. Headaches are R sided, throbbing, sharp and constant since Tuesday. She had similar headaches 10 years ago that resolved after 3-4 days. Pt has associated nausea and reports emesis x 1 today. No associated seizures, altered mental status, loss of consciousness, new weakness, or numbness, no gait instability. Pt does endorse some blurry vision on the R side. Pt's headache is not worse when supine. She is not on any estrogen tx and denies any hypercoagulability. Pt's sister and mother suffer from migraines. Pt denies trauma, neck stiffness.   ROS 10 Systems reviewed and are negative for acute change except as noted in the HPI.       Past Medical History:  Diagnosis Date  . Sciatic nerve pain   . UTI (lower urinary tract infection)     Patient Active Problem List   Diagnosis Date Noted  . INTERNAL DERANGEMENT, RIGHT KNEE 09/08/2010  . KNEE PAIN 09/08/2010    Past Surgical History:  Procedure Laterality Date  . APPENDECTOMY    . CHOLECYSTECTOMY    . ENDOMETRIAL ABLATION    . KNEE ARTHROSCOPY    . SHOULDER SURGERY Left   . TONSILLECTOMY      OB History    Gravida Para Term Preterm AB Living   0 0 0 0 0 0   SAB TAB Ectopic Multiple Live Births   0 0 0 0         Home Medications    Prior to Admission medications   Medication Sig Start Date End Date Taking? Authorizing Provider  acetaminophen (TYLENOL) 500 MG tablet Take 1,000 mg by mouth every 6 (six) hours as needed for moderate pain or headache.   Yes Historical Provider, MD  cyclobenzaprine (FLEXERIL) 10 MG tablet Take 1 tablet (10 mg total) by mouth 2 (two) times daily as needed for muscle spasms. 10/11/15  Yes Jeffrey Hedges,  PA-C  albuterol (PROVENTIL HFA;VENTOLIN HFA) 108 (90 BASE) MCG/ACT inhaler Inhale 2-3 puffs into the lungs every 4 (four) hours as needed for wheezing or shortness of breath. Patient not taking: Reported on 10/11/2015 01/30/15   Bridgett Larssonhris Post, MD  doxycycline (VIBRAMYCIN) 100 MG capsule Take 1 capsule (100 mg total) by mouth 2 (two) times daily. Patient not taking: Reported on 10/11/2015 07/23/15   Dione Boozeavid Glick, MD  erythromycin ophthalmic ointment Place a 1/2 inch ribbon of ointment into the lower eyelid. Patient not taking: Reported on 04/23/2016 12/20/15   Ace GinsSerena Y Sam, PA-C  HYDROcodone-acetaminophen (NORCO/VICODIN) 5-325 MG tablet Take 1 tablet by mouth every 6 (six) hours as needed for severe pain. Patient not taking: Reported on 10/11/2015 07/24/15   Marily MemosJason Mesner, MD  hydrOXYzine (ATARAX/VISTARIL) 25 MG tablet Take 1 tablet (25 mg total) by mouth every 6 (six) hours as needed for itching. 12/20/15   Ace GinsSerena Y Sam, PA-C  naproxen (NAPROSYN) 500 MG tablet Take 1 tablet (500 mg total) by mouth 2 (two) times daily. 04/23/16   Derwood KaplanAnkit Elfie Costanza, MD  nitrofurantoin, macrocrystal-monohydrate, (MACROBID) 100 MG capsule Take 1 capsule (100 mg total) by mouth 2 (two) times daily. X 7 days Patient not taking: Reported on 10/11/2015 07/23/15   Dione Boozeavid Glick, MD  traMADol Janean Sark(ULTRAM)  50 MG tablet Take 1 tablet (50 mg total) by mouth every 6 (six) hours as needed. Patient not taking: Reported on 10/11/2015 07/23/15   Dione Boozeavid Glick, MD    Family History Family History  Problem Relation Age of Onset  . Cancer Maternal Grandmother     colon    Social History Social History  Substance Use Topics  . Smoking status: Current Every Day Smoker    Packs/day: 0.50    Types: Cigarettes  . Smokeless tobacco: Never Used  . Alcohol use No     Allergies   Citrus; Ibuprofen; and Vicodin [hydrocodone-acetaminophen]   Review of Systems Review of Systems   Physical Exam Updated Vital Signs BP 123/67 (BP Location: Left Arm)  Comment (BP Location): taken in pt's forearm  Pulse 71   Temp 98.5 F (36.9 C) (Oral)   Resp 18   Ht 5\' 7"  (1.702 m)   Wt (!) 360 lb (163.3 kg)   LMP 04/02/2016   SpO2 100%   BMI 56.38 kg/m   Physical Exam  Constitutional: She is oriented to person, place, and time. She appears well-developed.  HENT:  Head: Normocephalic and atraumatic.  Eyes: Conjunctivae and EOM are normal. Pupils are equal, round, and reactive to light.  Neck: Normal range of motion. Neck supple.  Cardiovascular: Normal rate, regular rhythm and normal heart sounds.   Pulmonary/Chest: Effort normal and breath sounds normal. No respiratory distress.  Abdominal: Soft. Bowel sounds are normal. She exhibits no distension. There is no tenderness. There is no rebound and no guarding.  Lymphadenopathy:    She has cervical adenopathy.  Neurological: She is alert and oriented to person, place, and time. No cranial nerve deficit. Coordination normal.  Skin: Skin is warm and dry.  Nursing note and vitals reviewed.    ED Treatments / Results  Labs (all labs ordered are listed, but only abnormal results are displayed) Labs Reviewed - No data to display  EKG  EKG Interpretation None       Radiology No results found.  Procedures Procedures (including critical care time)  Medications Ordered in ED Medications  ketorolac (TORADOL) 15 MG/ML injection 15 mg (15 mg Intravenous Given 04/23/16 1226)  metoCLOPramide (REGLAN) injection 10 mg (10 mg Intravenous Given 04/23/16 1227)  diphenhydrAMINE (BENADRYL) injection 25 mg (25 mg Intravenous Given 04/23/16 1227)  sodium chloride 0.9 % bolus 1,000 mL (1,000 mLs Intravenous New Bag/Given 04/23/16 1226)     Initial Impression / Assessment and Plan / ED Course  I have reviewed the triage vital signs and the nursing notes.  Pertinent labs & imaging results that were available during my care of the patient were reviewed by me and considered in my medical decision making  (see chart for details).  Clinical Course  Comment By Time  Upon reassessment, patient reports that the headache has improved dramatically. She continues to have no neurologic complains. Strict return precautions discussed, pt will return to the ER if there is visual complains, seizures, altered mental status, loss of consciousness, dizziness, new focal weakness, or numbness. Neuro f/u requested for possible migrainous headache Derwood KaplanAnkit Daegon Deiss, MD 08/18 1421    Pt comes in with cc of headache. DDX includes: Primary headaches - including migrainous headaches, cluster headaches, tension headaches. Sinus/venous thrombosis Vascular headaches AV malformation Brain aneurysm Muscular headaches  A/P:  No concerns for life threatening secondary headaches because of a non focal neuro exam, no drug use, no hypercoagulability/estrogen use.  This appears to be migraine type headache.  We will give patient toradol and reglan and then reassess.    Final Clinical Impressions(s) / ED Diagnoses   Final diagnoses:  Bad headache    New Prescriptions New Prescriptions   NAPROXEN (NAPROSYN) 500 MG TABLET    Take 1 tablet (500 mg total) by mouth 2 (two) times daily.     Derwood Kaplan, MD 04/23/16 1422

## 2016-04-27 ENCOUNTER — Encounter: Payer: Self-pay | Admitting: Neurology

## 2016-04-27 ENCOUNTER — Ambulatory Visit (INDEPENDENT_AMBULATORY_CARE_PROVIDER_SITE_OTHER): Payer: BLUE CROSS/BLUE SHIELD | Admitting: Neurology

## 2016-04-27 DIAGNOSIS — G5711 Meralgia paresthetica, right lower limb: Secondary | ICD-10-CM

## 2016-04-27 DIAGNOSIS — M542 Cervicalgia: Secondary | ICD-10-CM | POA: Diagnosis not present

## 2016-04-27 DIAGNOSIS — G43009 Migraine without aura, not intractable, without status migrainosus: Secondary | ICD-10-CM | POA: Diagnosis not present

## 2016-04-27 DIAGNOSIS — M5481 Occipital neuralgia: Secondary | ICD-10-CM | POA: Diagnosis not present

## 2016-04-27 MED ORDER — IMIPRAMINE HCL 25 MG PO TABS: 25.0000 mg | ORAL_TABLET | Freq: Every day | ORAL | 3 refills | Status: DC

## 2016-04-27 MED ORDER — CYCLOBENZAPRINE HCL 10 MG PO TABS: 10.0000 mg | ORAL_TABLET | Freq: Three times a day (TID) | ORAL | 1 refills | Status: DC | PRN

## 2016-04-27 NOTE — Progress Notes (Signed)
[facility]  PATIENT: Tiffany Conley DOB: 06/22/1981  REFERRING DOCTOR OR PCP:  ED SOURCE: patient, ED notes  _________________________________   HISTORICAL  CHIEF COMPLAINT:  Chief Complaint  Patient presents with  . Headache    Tiffany Conley is here for eval of h/a.  Sts. she never had a problem with h/a's in the past, but on 04-20-16, she had onset of h/a that worsened over the next 2 days, resulting in an ER visit.  Sts. she received IM inj. and rx. for Naproxen that helps some, not enough. Onset Friday of productive cough with green sputum, wheezing, runny nose./fim    HISTORY OF PRESENT ILLNESS:  I had the pleasure of seeing your patient, Tiffany Conley, at [facility] for a neurologic consultation regarding her headaches.     Her current headache started 04/20/2016. The pain has always been worse on the right than the left. She started with a pain with some pounding quality. There is pain in the back of the head on the right and also when the pain intensifies she will feel pain near the right eye. With the pain intensifies she also has nausea and had vomiting last Friday. In general, the pain is worse if she is moving around. She has some photophobia and phonophobia when the headache is worse. She is more comfortable lying down in a recliner than lying down in the bed.    Over-the-counter medications have not helped. She went to the emergency room.   In the emergency room, she was given a shot of Toradol, Reglan and diphenhydramine. Pain is improved but came back later. He was given a prescription for Naprosyn.  She started to experience an upper respiratory infection a couple of days ago. She has a productive cough runny nose and feels a little tender on the right side of her neck.  She generally does not experience many headaches. In the past, she would get occasional headaches that would usually last only a couple hours and were often  associated with her menstrual cycle. Over-the-counter medicines like Tylenol would usually not those out. There is a family history of migraines.  REVIEW OF SYSTEMS: Constitutional: No fevers, chills, sweats, or change in appetite.    Sleep has been worse with headache Eyes: No visual changes, double vision, eye pain Ear, nose and throat: No hearing loss, ear pain, nasal congestion, sore throat Cardiovascular: No chest pain, palpitations Respiratory: No shortness of breath at rest or with exertion.   No wheezes GastrointestinaI: No nausea, vomiting, diarrhea, abdominal pain, fecal incontinence Genitourinary: No dysuria, urinary retention or frequency.  No nocturia. Musculoskeletal: As above.   Also has back pain Integumentary: No rash, pruritus, skin lesions Neurological: as above  Also numbness in right thigh Psychiatric: No depression at this time.  No anxiety Endocrine: No palpitations, diaphoresis, change in appetite, change in weigh or increased thirst Hematologic/Lymphatic: No anemia, purpura, petechiae. Allergic/Immunologic: No itchy/runny eyes, nasal congestion, recent allergic reactions, rashes  ALLERGIES: Allergies  Allergen Reactions  . Citrus Itching and Rash  . Ibuprofen Nausea Only  . Vicodin [Hydrocodone-Acetaminophen] Nausea And Vomiting    HOME MEDICATIONS:  Current Outpatient Prescriptions:  .  acetaminophen (TYLENOL) 500 MG tablet, Take 1,000 mg by mouth every 6 (six) hours as needed for moderate pain or headache., Disp: , Rfl:  .  naproxen (NAPROSYN) 500 MG tablet, Take 1 tablet (500 mg total) by mouth 2 (two) times daily., Disp: 30 tablet, Rfl: 0 .  cyclobenzaprine (FLEXERIL) 10  MG tablet, Take 1 tablet (10 mg total) by mouth 2 (two) times daily as needed for muscle spasms. (Patient not taking: Reported on 04/27/2016), Disp: 20 tablet, Rfl: 0 .  erythromycin ophthalmic ointment, Place a 1/2 inch ribbon of ointment into the lower eyelid. (Patient not taking:  Reported on 04/23/2016), Disp: 3.5 g, Rfl: 0 .  HYDROcodone-acetaminophen (NORCO/VICODIN) 5-325 MG tablet, Take 1 tablet by mouth every 6 (six) hours as needed for severe pain. (Patient not taking: Reported on 10/11/2015), Disp: 10 tablet, Rfl: 0 .  hydrOXYzine (ATARAX/VISTARIL) 25 MG tablet, Take 1 tablet (25 mg total) by mouth every 6 (six) hours as needed for itching. (Patient not taking: Reported on 04/27/2016), Disp: 12 tablet, Rfl: 0  PAST MEDICAL HISTORY: Past Medical History:  Diagnosis Date  . Headache   . Sciatic nerve pain   . UTI (lower urinary tract infection)     PAST SURGICAL HISTORY: Past Surgical History:  Procedure Laterality Date  . APPENDECTOMY    . CHOLECYSTECTOMY    . ENDOMETRIAL ABLATION    . KNEE ARTHROSCOPY    . SHOULDER SURGERY Left   . TONSILLECTOMY      FAMILY HISTORY: Family History  Problem Relation Age of Onset  . Cancer Maternal Grandmother     colon  . Congestive Heart Failure Father     SOCIAL HISTORY:  Social History   Social History  . Marital status: Single    Spouse name: N/A  . Number of children: N/A  . Years of education: N/A   Occupational History  . Not on file.   Social History Main Topics  . Smoking status: Current Every Day Smoker    Packs/day: 0.50    Types: Cigarettes  . Smokeless tobacco: Never Used  . Alcohol use No  . Drug use: No  . Sexual activity: Not Currently    Birth control/ protection: None   Other Topics Concern  . Not on file   Social History Narrative  . No narrative on file     PHYSICAL EXAM  Vitals:   04/27/16 1323  BP: 132/88  Pulse: 76  Resp: 18  Weight: (!) 354 lb (160.6 kg)  Height: 5\' 7"  (1.702 m)    Body mass index is 55.44 kg/m.   General: The patient is well-developed and well-nourished and in no acute distress  Eyes:  Funduscopic exam shows normal optic discs and retinal vessels.  Neck: The neck is supple, no carotid bruits are noted.  The neck is very tender over the  occiput, right >> left  Cardiovascular: The heart has a regular rate and rhythm with a normal S1 and S2. There were no murmurs, gallops or rubs. Lungs are clear to auscultation.  Skin: Extremities are without significant edema.  Musculoskeletal:  Back is nontender  Neurologic Exam  Mental status: The patient is alert and oriented x 3 at the time of the examination. The patient has apparent normal recent and remote memory, with an apparently normal attention span and concentration ability.   Speech is normal.  Cranial nerves: Extraocular movements are full. Pupils are equal, round, and reactive to light and accomodation.  Visual fields are full.  Facial symmetry is present. There is good facial sensation to soft touch bilaterally.Facial strength is normal.  Trapezius and sternocleidomastoid strength is normal. No dysarthria is noted.  The tongue is midline, and the patient has symmetric elevation of the soft palate. No obvious hearing deficits are noted.  Motor:  Muscle bulk is  normal.   Tone is normal. Strength is  5 / 5 in all 4 extremities.   Sensory: Sensory testing is intact to pinprick, soft touch and vibration sensation in all 4 extremities except the LFCN distribution of the right anterolateral thigh  Coordination: Cerebellar testing reveals good finger-nose-finger and heel-to-shin bilaterally.  Gait and station: Station is normal.   Gait is normal. Tandem gait is normal. Romberg is negative.   Reflexes: Deep tendon reflexes are symmetric and normal bilaterally.   Plantar responses are flexor.    DIAGNOSTIC DATA (LABS, IMAGING, TESTING) - I reviewed patient records, labs, notes, testing and imaging myself where available.  Lab Results  Component Value Date   WBC 9.1 05/28/2015   HGB 14.9 05/28/2015   HCT 42.6 05/28/2015   MCV 91.6 05/28/2015   PLT 256 05/28/2015      Component Value Date/Time   NA 138 05/28/2015 0606   K 3.9 05/28/2015 0606   CL 109 05/28/2015 0606    CO2 23 05/28/2015 0606   GLUCOSE 111 (H) 05/28/2015 0606   BUN 9 05/28/2015 0606   CREATININE 0.76 05/28/2015 0606   CALCIUM 8.7 (L) 05/28/2015 0606   PROT 6.1 (L) 05/28/2015 0606   ALBUMIN 3.4 (L) 05/28/2015 0606   AST 24 05/28/2015 0606   ALT 25 05/28/2015 0606   ALKPHOS 57 05/28/2015 0606   BILITOT 0.6 05/28/2015 0606   GFRNONAA >60 05/28/2015 0606   GFRAA >60 05/28/2015 0606       ASSESSMENT AND PLAN  Occipital neuralgia of right side  Common migraine without intractability  Neck pain  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   Headaches have features of occipital neuralgia as well as migraine.  1.    An occipital nerve block with 80 mg Depo-Medrol in 3 mL Marcaine was performed on the right using sterile technique. She tolerated the procedure well and there were no complications.   The pain in the back of the head was completely resolved after doing this injection but she still had 6/10 pain over the forehead. She received 60 mg of Toradol IM 2.    Imipramine 25 mg by mouth daily at bedtime. This should help her headache as well as sleep.  If she goes a couple weeks without headaches, she can stop. 3.    She will return to see me in 6-8 weeks or call sooner if there are new or worsening symptoms or if the headache does not improve.  Thank you for asking me to see Tiffany Conley for a neurologic consultation. Please let me know what you be of further assistance with her or other patients in the future.   Yancy Hascall A. Epimenio FootSater, MD, PhD 04/27/2016, 1:43 PM Certified in Neurology, Clinical Neurophysiology, Sleep Medicine, Pain Medicine and Neuroimaging  [facility] [address], [address] [place], [place] [address] [phone]

## 2016-04-28 ENCOUNTER — Telehealth: Payer: Self-pay | Admitting: Neurology

## 2016-04-28 NOTE — Telephone Encounter (Signed)
Pt called to advise today the pain is worse at the injection site, radiating out and sore to the touch. Please call

## 2016-04-28 NOTE — Telephone Encounter (Signed)
I have spoken with Metro KungLashara this morning.  She c/o soreness at inj. site.  I have advised that as with any inj., she may have some soreness that should improve over the next few days.  She verbalized understanding of same, sts. she is using an ice pack, which helps.  No other concerns/sx voiced/fim

## 2016-05-05 ENCOUNTER — Emergency Department (HOSPITAL_COMMUNITY): Payer: BLUE CROSS/BLUE SHIELD

## 2016-05-05 ENCOUNTER — Encounter (HOSPITAL_COMMUNITY): Payer: Self-pay | Admitting: *Deleted

## 2016-05-05 ENCOUNTER — Emergency Department (HOSPITAL_COMMUNITY)
Admission: EM | Admit: 2016-05-05 | Discharge: 2016-05-05 | Disposition: A | Discharge status: Home / Self Care | Payer: BLUE CROSS/BLUE SHIELD | Attending: Emergency Medicine | Admitting: Emergency Medicine

## 2016-05-05 DIAGNOSIS — J069 Acute upper respiratory infection, unspecified: Secondary | ICD-10-CM | POA: Insufficient documentation

## 2016-05-05 DIAGNOSIS — F17210 Nicotine dependence, cigarettes, uncomplicated: Secondary | ICD-10-CM | POA: Insufficient documentation

## 2016-05-05 DIAGNOSIS — Z79899 Other long term (current) drug therapy: Secondary | ICD-10-CM | POA: Diagnosis not present

## 2016-05-05 DIAGNOSIS — Z791 Long term (current) use of non-steroidal anti-inflammatories (NSAID): Secondary | ICD-10-CM | POA: Insufficient documentation

## 2016-05-05 DIAGNOSIS — R05 Cough: Secondary | ICD-10-CM | POA: Diagnosis present

## 2016-05-05 MED ORDER — OXYMETAZOLINE HCL 0.05 % NA SOLN: 1.0000 | Freq: Two times a day (BID) | NASAL | 0 refills | Status: DC

## 2016-05-05 MED ORDER — PREDNISONE 20 MG PO TABS
60.0000 mg | ORAL_TABLET | Freq: Once | ORAL | Status: AC
  Administered 2016-05-05: 60 mg via ORAL
  Filled 2016-05-05: qty 3

## 2016-05-05 MED ORDER — ALBUTEROL SULFATE HFA 108 (90 BASE) MCG/ACT IN AERS
2.0000 | INHALATION_SPRAY | Freq: Once | RESPIRATORY_TRACT | Status: AC
  Administered 2016-05-05: 2 via RESPIRATORY_TRACT
  Filled 2016-05-05: qty 6.7

## 2016-05-05 MED ORDER — ALBUTEROL (5 MG/ML) CONTINUOUS INHALATION SOLN
10.0000 mg/h | INHALATION_SOLUTION | RESPIRATORY_TRACT | Status: DC
  Administered 2016-05-05: 10 mg/h via RESPIRATORY_TRACT
  Filled 2016-05-05: qty 20

## 2016-05-05 MED ORDER — PREDNISONE 20 MG PO TABS: 40.0000 mg | ORAL_TABLET | Freq: Every day | ORAL | 0 refills | Status: DC

## 2016-05-05 MED ORDER — ALBUTEROL SULFATE (2.5 MG/3ML) 0.083% IN NEBU
5.0000 mg | INHALATION_SOLUTION | Freq: Once | RESPIRATORY_TRACT | Status: AC
  Administered 2016-05-05: 5 mg via RESPIRATORY_TRACT
  Filled 2016-05-05: qty 6

## 2016-05-05 MED ORDER — BENZONATATE 100 MG PO CAPS: 200.0000 mg | ORAL_CAPSULE | Freq: Two times a day (BID) | ORAL | 0 refills | Status: DC | PRN

## 2016-05-05 NOTE — ED Triage Notes (Signed)
Pt c/o cough and congestion x 2 weeks; pt states that it has gotten worse over the last few days; pt reports productive cough at times with yellow to white mucous; pt states that she has shortness of breath when laying down and when exerting herself; pt states that she was seen recently for headache and advised the MD about the congestion but states that nothing was prescribed for her

## 2016-05-05 NOTE — ED Provider Notes (Signed)
[facility]-EMERGENCY DEPT Provider Note   CSN: [id number] Arrival date & time: 05/05/16  0527     History   Chief Complaint Chief Complaint  Patient presents with  . URI    HPI Tiffany Conley is a 35 y.o. female.  Patient is a 35 year old female with no pertinent past medical history presents ED with complaint of URI symptoms. Patient reports over the past 2 weeks she has had worsening productive cough and nasal congestion. Pt also reports having mild SOB when she has a coughing attack. She also states that she will get mild chest discomfort with coughing. Patient also reports associated chills, right ear pain, mild sore throat and wheezing. Pt reports she has been taking OTC decongestants and antitussive without relief. Denies fever, HA, visual changes, lightheadedness/dizziness, ear drainage, loss of hearing, hemoptysis, palpitations, abdominal pain, N/V/D, leg swelling. Pt denies use of OCPs. Denies any recent hospitalizations, surgeries, hx of cancer, DVT. Denies any known sick contacts.       Past Medical History:  Diagnosis Date  . Headache   . Sciatic nerve pain   . UTI (lower urinary tract infection)     Patient Active Problem List   Diagnosis Date Noted  . Occipital neuralgia of right side 04/27/2016  . Common migraine without intractability 04/27/2016  . Neck pain 04/27/2016  . 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 INTERNAL DERANGEMENT, RIGHT KNEE 09/08/2010  . KNEE PAIN 09/08/2010    Past Surgical History:  Procedure Laterality Date  . APPENDECTOMY    . CHOLECYSTECTOMY    . ENDOMETRIAL ABLATION    . KNEE ARTHROSCOPY    . SHOULDER SURGERY Left   . TONSILLECTOMY      OB History    Gravida Para Term Preterm AB Living   0 0 0 0 0 0   SAB TAB Ectopic Multiple Live Births   0 0 0 0         Home Medications    Prior to Admission medications   Medication Sig Start Date End Date Taking? Authorizing Provider  acetaminophen  (TYLENOL) 500 MG tablet Take 1,000 mg by mouth every 6 (six) hours as needed for moderate pain or headache.   Yes Historical Provider, MD  cyclobenzaprine (FLEXERIL) 10 MG tablet Take 1 tablet (10 mg total) by mouth 3 (three) times daily as needed for muscle spasms. 04/27/16  Yes Asa Lente, MD  imipramine (TOFRANIL) 25 MG tablet Take 1 tablet (25 mg total) by mouth at bedtime. 04/27/16  Yes Asa Lente, MD  naproxen (NAPROSYN) 500 MG tablet Take 1 tablet (500 mg total) by mouth 2 (two) times daily. 04/23/16  Yes Derwood Kaplan, MD  benzonatate (TESSALON) 100 MG capsule Take 2 capsules (200 mg total) by mouth 2 (two) times daily as needed for cough. 05/05/16   Barrett Henle, PA-C  erythromycin ophthalmic ointment Place a 1/2 inch ribbon of ointment into the lower eyelid. Patient not taking: Reported on 04/23/2016 12/20/15   Ace Gins Sam, PA-C  HYDROcodone-acetaminophen (NORCO/VICODIN) 5-325 MG tablet Take 1 tablet by mouth every 6 (six) hours as needed for severe pain. Patient not taking: Reported on 10/11/2015 07/24/15   Marily Memos, MD  hydrOXYzine (ATARAX/VISTARIL) 25 MG tablet Take 1 tablet (25 mg total) by mouth every 6 (six) hours as needed for itching. Patient not taking: Reported on 04/27/2016 12/20/15   Ace Gins Sam, PA-C  oxymetazoline (AFRIN NASAL SPRAY) 0.05 % nasal spray Place 1 spray into both  nostrils 2 (two) times daily. You may spray medicine and both nostrils twice daily for up to 3 days. Do not use for more than 3 days to prevent rebound rhinorrhea. 05/05/16   Barrett Henle, PA-C  predniSONE (DELTASONE) 20 MG tablet Take 2 tablets (40 mg total) by mouth daily. 05/05/16   Barrett Henle, PA-C    Family History Family History  Problem Relation Age of Onset  . Cancer Maternal Grandmother     colon  . Congestive Heart Failure Father     Social History Social History  Substance Use Topics  . Smoking status: Current Every Day Smoker    Packs/day:  0.50    Types: Cigarettes  . Smokeless tobacco: Never Used  . Alcohol use No     Allergies   Citrus; Ibuprofen; and Vicodin [hydrocodone-acetaminophen]   Review of Systems Review of Systems  Constitutional: Positive for chills.  HENT: Positive for congestion and ear pain.   Respiratory: Positive for cough, shortness of breath and wheezing.   Cardiovascular: Positive for chest pain.  All other systems reviewed and are negative.    Physical Exam Updated Vital Signs BP (!) 135/118   Pulse 91   Temp 97.9 F (36.6 C)   Resp 23   LMP 04/30/2016   SpO2 100%   Physical Exam  Constitutional: She is oriented to person, place, and time. She appears well-developed and well-nourished. No distress.  HENT:  Head: Normocephalic and atraumatic.  Right Ear: A middle ear effusion is present.  Left Ear: Tympanic membrane normal.  No middle ear effusion.  Nose: Nose normal. Right sinus exhibits no maxillary sinus tenderness and no frontal sinus tenderness. Left sinus exhibits no maxillary sinus tenderness and no frontal sinus tenderness.  Mouth/Throat: Uvula is midline, oropharynx is clear and moist and mucous membranes are normal. No oropharyngeal exudate, posterior oropharyngeal edema, posterior oropharyngeal erythema or tonsillar abscesses. No tonsillar exudate.  Eyes: Conjunctivae and EOM are normal. Right eye exhibits no discharge. Left eye exhibits no discharge. No scleral icterus.  Neck: Normal range of motion. Neck supple.  Cardiovascular: Normal rate, regular rhythm, normal heart sounds and intact distal pulses.   Pulmonary/Chest: Effort normal. No respiratory distress. She has wheezes (diffuse expiratory wheezes bilaterally). She has no rales. She exhibits no tenderness.  Abdominal: Soft. She exhibits no distension and no mass. There is no tenderness. There is no rebound and no guarding. No hernia.  Musculoskeletal: Normal range of motion. She exhibits no edema.  Lymphadenopathy:     She has no cervical adenopathy.  Neurological: She is alert and oriented to person, place, and time.  Skin: Skin is warm and dry. She is not diaphoretic.  Nursing note and vitals reviewed.    ED Treatments / Results  Labs (all labs ordered are listed, but only abnormal results are displayed) Labs Reviewed - No data to display  EKG  EKG Interpretation None       Radiology Dg Chest 2 View  Result Date: 05/05/2016 CLINICAL DATA:  Cough and congestion for 2 weeks. EXAM: CHEST  2 VIEW COMPARISON:  01/30/2015 FINDINGS: The lungs are clear. The pulmonary vasculature is normal. Heart size is normal. Hilar and mediastinal contours are unremarkable. There is no pleural effusion. IMPRESSION: No active cardiopulmonary disease. Electronically Signed   By: Ellery Plunk M.D.   On: 05/05/2016 06:29    Procedures Procedures (including critical care time)  Medications Ordered in ED Medications  albuterol (PROVENTIL,VENTOLIN) solution continuous neb (0 mg/hr Nebulization  Stopped 05/05/16 0840)  albuterol (PROVENTIL) (2.5 MG/3ML) 0.083% nebulizer solution 5 mg (5 mg Nebulization Given 05/05/16 0630)  predniSONE (DELTASONE) tablet 60 mg (60 mg Oral Given 05/05/16 0705)  albuterol (PROVENTIL HFA;VENTOLIN HFA) 108 (90 Base) MCG/ACT inhaler 2 puff (2 puffs Inhalation Rx Charged 05/05/16 0841)     Initial Impression / Assessment and Plan / ED Course  I have reviewed the triage vital signs and the nursing notes.  Pertinent labs & imaging results that were available during my care of the patient were reviewed by me and considered in my medical decision making (see chart for details).  Clinical Course    Pt presents with URI sxs and worsening asthma, no relief with inhaler at home. Friend she is visiting is also sick with similar sxs. VSS. Exam revealed diffuse expiratory wheezing and right mid ear effusion. Pt given continuous neb and prednisone. CXR negative. On reevaluation wheezing has  significantly improved, VSS, pt reports her sxs have resolved. Suspect sxs are likely due to viral URI with secondary asthma exacerbation. Plan to d/c pt home with symptomatic tx and advise pt to continue using her home inhalers as needed. Pt given info to follow up with PCP as needed. Discussed results and plan for d/c with pt. Discussed strict return precautions with pt.   Final Clinical Impressions(s) / ED Diagnoses   Final diagnoses:  URI (upper respiratory infection)    New Prescriptions Discharge Medication List as of 05/05/2016  8:35 AM    START taking these medications   Details  benzonatate (TESSALON) 100 MG capsule Take 2 capsules (200 mg total) by mouth 2 (two) times daily as needed for cough., Starting Wed 05/05/2016, Print    oxymetazoline (AFRIN NASAL SPRAY) 0.05 % nasal spray Place 1 spray into both nostrils 2 (two) times daily. You may spray medicine and both nostrils twice daily for up to 3 days. Do not use for more than 3 days to prevent rebound rhinorrhea., Starting Wed 05/05/2016, Print    predniSONE (DELTASONE) 20 MG tablet Take 2 tablets (40 mg total) by mouth daily., Starting Wed 05/05/2016, Print         Barrett Henleicole Elizabeth Nadeau, [place] JerseyPA-C 05/05/16 1005    April Palumbo, MD 05/05/16 [phone]

## 2016-05-05 NOTE — ED Notes (Signed)
Patient transported to X-ray 

## 2016-05-05 NOTE — Discharge Instructions (Signed)
Take your medications as prescribed. Continue drinking fluids at home to remain hydrated. Please follow up with a primary care provider from the Resource Guide provided below in 1 week as needed. Please return to the Emergency Department if symptoms worsen or new onset of fever, coughing up blood, difficulty breathing, chest pain, shortness of breath, lightheadedness, dizziness.

## 2016-07-06 ENCOUNTER — Ambulatory Visit: Payer: BLUE CROSS/BLUE SHIELD | Admitting: Neurology

## 2016-07-09 ENCOUNTER — Emergency Department (HOSPITAL_COMMUNITY): Payer: BLUE CROSS/BLUE SHIELD

## 2016-07-09 ENCOUNTER — Encounter (HOSPITAL_COMMUNITY): Payer: Self-pay | Admitting: Emergency Medicine

## 2016-07-09 ENCOUNTER — Emergency Department (HOSPITAL_COMMUNITY)
Admission: EM | Admit: 2016-07-09 | Discharge: 2016-07-09 | Disposition: A | Discharge status: Home / Self Care | Payer: BLUE CROSS/BLUE SHIELD | Attending: Emergency Medicine | Admitting: Emergency Medicine

## 2016-07-09 ENCOUNTER — Emergency Department (HOSPITAL_BASED_OUTPATIENT_CLINIC_OR_DEPARTMENT_OTHER)
Admit: 2016-07-09 | Discharge: 2016-07-09 | Disposition: A | Discharge status: Home / Self Care | Payer: BLUE CROSS/BLUE SHIELD | Attending: Emergency Medicine | Admitting: Emergency Medicine

## 2016-07-09 DIAGNOSIS — M79662 Pain in left lower leg: Secondary | ICD-10-CM | POA: Insufficient documentation

## 2016-07-09 DIAGNOSIS — F17210 Nicotine dependence, cigarettes, uncomplicated: Secondary | ICD-10-CM | POA: Diagnosis not present

## 2016-07-09 DIAGNOSIS — M79605 Pain in left leg: Secondary | ICD-10-CM

## 2016-07-09 DIAGNOSIS — M79609 Pain in unspecified limb: Secondary | ICD-10-CM

## 2016-07-09 MED ORDER — NAPROXEN 250 MG PO TABS
500.0000 mg | ORAL_TABLET | Freq: Once | ORAL | Status: AC
  Administered 2016-07-09: 500 mg via ORAL
  Filled 2016-07-09: qty 2

## 2016-07-09 MED ORDER — NAPROXEN 500 MG PO TABS: 500.0000 mg | ORAL_TABLET | Freq: Two times a day (BID) | ORAL | 0 refills | Status: DC

## 2016-07-09 MED ORDER — OXYCODONE-ACETAMINOPHEN 5-325 MG PO TABS
1.0000 | ORAL_TABLET | Freq: Once | ORAL | Status: AC
  Administered 2016-07-09: 1 via ORAL
  Filled 2016-07-09: qty 1

## 2016-07-09 MED ORDER — ONDANSETRON 4 MG PO TBDP
4.0000 mg | ORAL_TABLET | Freq: Once | ORAL | Status: AC
  Administered 2016-07-09: 4 mg via ORAL
  Filled 2016-07-09: qty 1

## 2016-07-09 NOTE — Progress Notes (Signed)
Preliminary results by tech - Venous Duplex Lower Ext. Left Completed. Negative for deep and superficial vein thrombosis. Bo Teicher, BS, RDMS, RVT   

## 2016-07-09 NOTE — Discharge Instructions (Signed)
Take the prescribed medication as directed. Follow-up with a primary care doctor in the area.  I have listed a few that may be good options, or you may find your own.  Your insurance company can help you find one that accepts your plan as well. Return to the ED for new or worsening symptoms.

## 2016-07-09 NOTE — ED Notes (Signed)
Pt verbalizes understanding of discharge instructions. Family present to take patient home. NAD. Refused wheelchair at departure, reports "it feels better to walk." pt ambulatory with cane, steady gait.

## 2016-07-09 NOTE — ED Triage Notes (Addendum)
Pt. reports bilateral legs pain with swelling for 2 weeks worse at left side , denies injury , ambulatory using her cane . Respirations unlabored / denies fever or chills.

## 2016-07-09 NOTE — ED Provider Notes (Signed)
[facility]-EMERGENCY DEPT Provider Note   CSN: [id number] Arrival date & time: 07/09/16  0534     History   Chief Complaint Chief Complaint  Patient presents with  . Legs Pain / Swelling    HPI Tiffany Conley is a 35 y.o. female.  The history is provided by the patient and medical records.   35 y.o. F with hx of frequent headaches, sciatica, frequent UTI's presenting to the ED for Leg pain. Patient states a few days ago she developed some pain in her left foot and ankle with some mild swelling. States this occurred on a daily she was wearing unsupportive shoes, so she felt it was related to this. States since then the swelling has progressed in her calf and left knee. She denies any injury, trauma, or falls. States pain is worse with weightbearing and ambulation, feels like a tightness in her knee and calf. No fever or chills. She denies any recent travel, prolonged immobilization, surgery, or use of exogenous estrogens. States she is rather sedentary, limited walking throughout the day she works at [employer]. States no significant pain in her right leg and time. She denies any chest pain or shortness of breath. No history of heart failure.  Past Medical History:  Diagnosis Date  . Headache   . Sciatic nerve pain   . UTI (lower urinary tract infection)     Patient Active Problem List   Diagnosis Date Noted  . Occipital neuralgia of right side 04/27/2016  . Common migraine without intractability 04/27/2016  . Neck pain 04/27/2016  . 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 INTERNAL DERANGEMENT, RIGHT KNEE 09/08/2010  . KNEE PAIN 09/08/2010    Past Surgical History:  Procedure Laterality Date  . APPENDECTOMY    . CHOLECYSTECTOMY    . ENDOMETRIAL ABLATION    . KNEE ARTHROSCOPY    . SHOULDER SURGERY Left   . TONSILLECTOMY      OB History    Gravida Para Term Preterm AB Living   0 0 0 0 0 0   SAB TAB Ectopic Multiple Live Births   0 0 0 0          Home Medications    Prior to Admission medications   Medication Sig Start Date End Date Taking? Authorizing Provider  cyclobenzaprine (FLEXERIL) 10 MG tablet Take 1 tablet (10 mg total) by mouth 3 (three) times daily as needed for muscle spasms. 04/27/16  Yes Asa Lente, MD  benzonatate (TESSALON) 100 MG capsule Take 2 capsules (200 mg total) by mouth 2 (two) times daily as needed for cough. Patient not taking: Reported on 07/09/2016 05/05/16   Barrett Henle, PA-C  erythromycin ophthalmic ointment Place a 1/2 inch ribbon of ointment into the lower eyelid. Patient not taking: Reported on 07/09/2016 12/20/15   Ace Gins Sam, PA-C  HYDROcodone-acetaminophen (NORCO/VICODIN) 5-325 MG tablet Take 1 tablet by mouth every 6 (six) hours as needed for severe pain. Patient not taking: Reported on 07/09/2016 07/24/15   Marily Memos, MD  hydrOXYzine (ATARAX/VISTARIL) 25 MG tablet Take 1 tablet (25 mg total) by mouth every 6 (six) hours as needed for itching. Patient not taking: Reported on 07/09/2016 12/20/15   Ace Gins Sam, PA-C  imipramine (TOFRANIL) 25 MG tablet Take 1 tablet (25 mg total) by mouth at bedtime. Patient not taking: Reported on 07/09/2016 04/27/16   Asa Lente, MD  naproxen (NAPROSYN) 500 MG tablet Take 1 tablet (500 mg total) by mouth  2 (two) times daily. Patient not taking: Reported on 07/09/2016 04/23/16   Derwood KaplanAnkit Nanavati, MD  oxymetazoline (AFRIN NASAL SPRAY) 0.05 % nasal spray Place 1 spray into both nostrils 2 (two) times daily. You may spray medicine and both nostrils twice daily for up to 3 days. Do not use for more than 3 days to prevent rebound rhinorrhea. Patient not taking: Reported on 07/09/2016 05/05/16   Barrett HenleNicole Elizabeth Nadeau, PA-C  predniSONE (DELTASONE) 20 MG tablet Take 2 tablets (40 mg total) by mouth daily. Patient not taking: Reported on 07/09/2016 05/05/16   Barrett HenleNicole Elizabeth Nadeau, PA-C    Family History Family History  Problem Relation Age of Onset  .  Cancer Maternal Grandmother     colon  . Congestive Heart Failure Father     Social History Social History  Substance Use Topics  . Smoking status: Current Every Day Smoker    Packs/day: 0.00    Types: Cigarettes  . Smokeless tobacco: Never Used  . Alcohol use No     Allergies   Citrus; Ibuprofen; and Vicodin [hydrocodone-acetaminophen]   Review of Systems Review of Systems  Musculoskeletal: Positive for arthralgias.  All other systems reviewed and are negative.    Physical Exam Updated Vital Signs LMP 07/02/2016 (Approximate)   Physical Exam  Constitutional: She is oriented to person, place, and time. She appears well-developed and well-nourished.  Morbidly obese  HENT:  Head: Normocephalic and atraumatic.  Mouth/Throat: Oropharynx is clear and moist.  Eyes: Conjunctivae and EOM are normal. Pupils are equal, round, and reactive to light.  Neck: Normal range of motion.  No JVD  Cardiovascular: Normal rate, regular rhythm and normal heart sounds.   Pulmonary/Chest: Effort normal and breath sounds normal. No respiratory distress. She has no wheezes. She has no rhonchi. She has no rales.  Lungs overall clear, no distress  Abdominal: Soft. Bowel sounds are normal.  Musculoskeletal: Normal range of motion.  Exam limited due to body habitus Left knee is TTP diffusely; no bony deformity noted, limited flexion/extension due to pain; no overlying skin changes or warmth to touch; slight swelling of left medial calf compared with right leg; some varicose veins noted without palpable cord; DP pulses intact bilaterally Right leg overall normal, post-surgical scars noted on right knee  Neurological: She is alert and oriented to person, place, and time.  Skin: Skin is warm and dry.  Psychiatric: She has a normal mood and affect.  Nursing note and vitals reviewed.    ED Treatments / Results  Labs (all labs ordered are listed, but only abnormal results are displayed) Labs  Reviewed - No data to display  EKG  EKG Interpretation None       Radiology Dg Knee Complete 4 Views Left  Result Date: 07/09/2016 CLINICAL DATA:  Nontraumatic left knee pain for 2 weeks EXAM: LEFT KNEE - COMPLETE 4+ VIEW COMPARISON:  None. FINDINGS: No evidence of fracture, dislocation, or joint effusion. No evidence of arthropathy or other focal bone abnormality. Soft tissues are unremarkable. IMPRESSION: Negative. Electronically Signed   By: Ellery Plunkaniel R Mitchell M.D.   On: 07/09/2016 06:50   Preliminary results by tech - Venous Duplex Lower Ext. Left Completed. Negative for deep and superficial vein thrombosis.  Marilynne Halstedita Sturdivant, BS, RDMS, RVT  Procedures Procedures (including critical care time)  Medications Ordered in ED Medications  naproxen (NAPROSYN) tablet 500 mg (500 mg Oral Given 07/09/16 0625)  ondansetron (ZOFRAN-ODT) disintegrating tablet 4 mg (4 mg Oral Given 07/09/16 0625)  oxyCODONE-acetaminophen (PERCOCET/ROXICET)  5-325 MG per tablet 1 tablet (1 tablet Oral Given 07/09/16 0845)     Initial Impression / Assessment and Plan / ED Course  I have reviewed the triage vital signs and the nursing notes.  Pertinent labs & imaging results that were available during my care of the patient were reviewed by me and considered in my medical decision making (see chart for details).  Clinical Course   35 y.o. F here with left knee and lower leg pain.  States she had some swelling of her foot/ankle but this has improved.  She is afebrile, non-toxic.  Exam somewhat limited due to her body habitus.  She does have diffuse tenderness or the knee without bony deformity.  No overlying erythema or warmth to touch.  Some swelling of the left proximal calf compared with right.  Do not clinically suspect septic joint.  Plain films and venous duplex obtained-- both negative.  It is likely that the majority of her symptoms are related to her weight. Discussed this with her. Recommended diet  modification as well as reduce salt intake to help reduce any peripheral edema.  Also recommended to elevate legs when resting.  She does not have PCP, given information for local offices for follow-up.  Discussed plan with patient, she acknowledged understanding and agreed with plan of care.  Return precautions given for new or worsening symptoms.  Final Clinical Impressions(s) / ED Diagnoses   Final diagnoses:  Pain of left lower extremity    New Prescriptions New Prescriptions   NAPROXEN (NAPROSYN) 500 MG TABLET    Take 1 tablet (500 mg total) by mouth 2 (two) times daily with a meal.     Garlon HatchetLisa M Sanders, PA-C 07/09/16 [id number]    Raeford RazorStephen Kohut, MD 07/12/16 1101

## 2016-07-09 NOTE — ED Notes (Signed)
Pt transporting to vascular lab

## 2016-07-13 ENCOUNTER — Encounter: Payer: Self-pay | Admitting: Neurology

## 2016-07-22 ENCOUNTER — Emergency Department (HOSPITAL_COMMUNITY)
Admission: EM | Admit: 2016-07-22 | Discharge: 2016-07-22 | Disposition: A | Discharge status: Home / Self Care | Payer: BLUE CROSS/BLUE SHIELD | Attending: Emergency Medicine | Admitting: Emergency Medicine

## 2016-07-22 ENCOUNTER — Encounter (HOSPITAL_COMMUNITY): Payer: Self-pay

## 2016-07-22 ENCOUNTER — Emergency Department (HOSPITAL_COMMUNITY): Payer: BLUE CROSS/BLUE SHIELD

## 2016-07-22 DIAGNOSIS — J4521 Mild intermittent asthma with (acute) exacerbation: Secondary | ICD-10-CM | POA: Diagnosis not present

## 2016-07-22 DIAGNOSIS — R0602 Shortness of breath: Secondary | ICD-10-CM | POA: Diagnosis present

## 2016-07-22 DIAGNOSIS — Z79899 Other long term (current) drug therapy: Secondary | ICD-10-CM | POA: Insufficient documentation

## 2016-07-22 DIAGNOSIS — J40 Bronchitis, not specified as acute or chronic: Secondary | ICD-10-CM | POA: Diagnosis not present

## 2016-07-22 DIAGNOSIS — F17210 Nicotine dependence, cigarettes, uncomplicated: Secondary | ICD-10-CM | POA: Insufficient documentation

## 2016-07-22 MED ORDER — IPRATROPIUM-ALBUTEROL 0.5-2.5 (3) MG/3ML IN SOLN
3.0000 mL | Freq: Once | RESPIRATORY_TRACT | Status: AC
  Administered 2016-07-22: 3 mL via RESPIRATORY_TRACT
  Filled 2016-07-22: qty 3

## 2016-07-22 MED ORDER — ALBUTEROL SULFATE HFA 108 (90 BASE) MCG/ACT IN AERS: 2.0000 | INHALATION_SPRAY | RESPIRATORY_TRACT | 2 refills | Status: DC | PRN

## 2016-07-22 MED ORDER — ALBUTEROL SULFATE (2.5 MG/3ML) 0.083% IN NEBU
5.0000 mg | INHALATION_SOLUTION | Freq: Once | RESPIRATORY_TRACT | Status: AC
  Administered 2016-07-22: 5 mg via RESPIRATORY_TRACT
  Filled 2016-07-22: qty 6

## 2016-07-22 MED ORDER — PREDNISONE 20 MG PO TABS
60.0000 mg | ORAL_TABLET | Freq: Once | ORAL | Status: AC
  Administered 2016-07-22: 60 mg via ORAL
  Filled 2016-07-22: qty 3

## 2016-07-22 MED ORDER — PREDNISONE 20 MG PO TABS: 60.0000 mg | ORAL_TABLET | Freq: Every day | ORAL | 0 refills | Status: DC

## 2016-07-22 NOTE — ED Triage Notes (Signed)
Pt complains of a cough and wheezing for one week, short of breath and chest pain started last night, pt has taken OTC meds and her inhaler with no relief

## 2016-07-22 NOTE — ED Provider Notes (Signed)
[facility]-EMERGENCY DEPT Provider Note   CSN: [id number] Arrival date & time: 07/22/16  0532     History   Chief Complaint Chief Complaint  Patient presents with  . Shortness of Breath    HPI Tiffany HoughLashara Conley is a 35 y.o. female.  Patient presents to emergency room with complaints of cough and shortness of breath. Patient reports a history of asthma and bronchitis with bronchospasm. She has used her inhaler without improvement. She reports congestion of the chest, productive cough and wheezing without fever.      Past Medical History:  Diagnosis Date  . Headache   . Sciatic nerve pain   . UTI (lower urinary tract infection)     Patient Active Problem List   Diagnosis Date Noted  . Occipital neuralgia of right side 04/27/2016  . Common migraine without intractability 04/27/2016  . Neck pain 04/27/2016  . 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 INTERNAL DERANGEMENT, RIGHT KNEE 09/08/2010  . KNEE PAIN 09/08/2010    Past Surgical History:  Procedure Laterality Date  . APPENDECTOMY    . CHOLECYSTECTOMY    . ENDOMETRIAL ABLATION    . KNEE ARTHROSCOPY    . SHOULDER SURGERY Left   . TONSILLECTOMY      OB History    Gravida Para Term Preterm AB Living   0 0 0 0 0 0   SAB TAB Ectopic Multiple Live Births   0 0 0 0         Home Medications    Prior to Admission medications   Medication Sig Start Date End Date Taking? Authorizing Provider  albuterol (PROVENTIL HFA;VENTOLIN HFA) 108 (90 Base) MCG/ACT inhaler Inhale 1-2 puffs into the lungs every 6 (six) hours as needed for wheezing or shortness of breath.   Yes Historical Provider, MD  OVER THE COUNTER MEDICATION Take 1 tablet by mouth as needed (cough).   Yes Historical Provider, MD  Phenylephrine-Pheniramine-DM [facility](THERAFLU COLD & COUGH PO) Take 30 mLs by mouth as needed (cough).   Yes Historical Provider, MD  albuterol (PROVENTIL HFA;VENTOLIN HFA) 108 (90 Base) MCG/ACT inhaler Inhale 2  puffs into the lungs every 4 (four) hours as needed for wheezing or shortness of breath. 07/22/16   Gilda Creasehristopher J Khayman Kirsch, MD  benzonatate (TESSALON) 100 MG capsule Take 2 capsules (200 mg total) by mouth 2 (two) times daily as needed for cough. Patient not taking: Reported on 07/09/2016 05/05/16   Barrett HenleNicole Elizabeth Nadeau, PA-C  cyclobenzaprine (FLEXERIL) 10 MG tablet Take 1 tablet (10 mg total) by mouth 3 (three) times daily as needed for muscle spasms. Patient not taking: Reported on 07/22/2016 04/27/16   Asa Lenteichard A Sater, MD  erythromycin ophthalmic ointment Place a 1/2 inch ribbon of ointment into the lower eyelid. Patient not taking: Reported on 07/09/2016 12/20/15   Ace GinsSerena Y Sam, PA-C  HYDROcodone-acetaminophen (NORCO/VICODIN) 5-325 MG tablet Take 1 tablet by mouth every 6 (six) hours as needed for severe pain. Patient not taking: Reported on 07/09/2016 07/24/15   Marily MemosJason Mesner, MD  hydrOXYzine (ATARAX/VISTARIL) 25 MG tablet Take 1 tablet (25 mg total) by mouth every 6 (six) hours as needed for itching. Patient not taking: Reported on 07/09/2016 12/20/15   Ace GinsSerena Y Sam, PA-C  imipramine (TOFRANIL) 25 MG tablet Take 1 tablet (25 mg total) by mouth at bedtime. Patient not taking: Reported on 07/09/2016 04/27/16   Asa Lenteichard A Sater, MD  naproxen (NAPROSYN) 500 MG tablet Take 1 tablet (500 mg total) by mouth 2 (two)  times daily with a meal. Patient not taking: Reported on 07/22/2016 07/09/16   Garlon Hatchet, PA-C  oxymetazoline [facility] NASAL SPRAY) 0.05 % nasal spray Place 1 spray into both nostrils 2 (two) times daily. You may spray medicine and both nostrils twice daily for up to 3 days. Do not use for more than 3 days to prevent rebound rhinorrhea. Patient not taking: Reported on 07/09/2016 05/05/16   Barrett Henle, PA-C  predniSONE (DELTASONE) 20 MG tablet Take 3 tablets (60 mg total) by mouth daily with breakfast. 07/22/16   Gilda Crease, MD    Family History Family History  Problem  Relation Age of Onset  . Cancer Maternal Grandmother     colon  . Congestive Heart Failure Father     Social History Social History  Substance Use Topics  . Smoking status: Current Every Day Smoker    Packs/day: 0.00    Types: Cigarettes  . Smokeless tobacco: Never Used  . Alcohol use No     Allergies   Citrus; Ibuprofen; and Vicodin [hydrocodone-acetaminophen]   Review of Systems Review of Systems  Respiratory: Positive for cough, shortness of breath and wheezing.   All other systems reviewed and are negative.    Physical Exam Updated Vital Signs BP 123/80   Pulse 88   Temp 98.2 F (36.8 C) (Oral)   Resp 20   LMP 07/02/2016 (Approximate)   SpO2 100%   Physical Exam  Constitutional: She is oriented to person, place, and time. She appears well-developed and well-nourished. No distress.  HENT:  Head: Normocephalic and atraumatic.  Right Ear: Hearing normal.  Left Ear: Hearing normal.  Nose: Nose normal.  Mouth/Throat: Oropharynx is clear and moist and mucous membranes are normal.  Eyes: Conjunctivae and EOM are normal. Pupils are equal, round, and reactive to light.  Neck: Normal range of motion. Neck supple.  Cardiovascular: Regular rhythm, S1 normal and S2 normal.  Exam reveals no gallop and no friction rub.   No murmur heard. Pulmonary/Chest: Effort normal. No respiratory distress. She has wheezes. She exhibits no tenderness.  Abdominal: Soft. Normal appearance and bowel sounds are normal. There is no hepatosplenomegaly. There is no tenderness. There is no rebound, no guarding, no tenderness at McBurney's point and negative Murphy's sign. No hernia.  Musculoskeletal: Normal range of motion.  Neurological: She is alert and oriented to person, place, and time. She has normal strength. No cranial nerve deficit or sensory deficit. Coordination normal. GCS eye subscore is 4. GCS verbal subscore is 5. GCS motor subscore is 6.  Skin: Skin is warm, dry and intact. No  rash noted. No cyanosis.  Psychiatric: She has a normal mood and affect. Her speech is normal and behavior is normal. Thought content normal.  Nursing note and vitals reviewed.    ED Treatments / Results  Labs (all labs ordered are listed, but only abnormal results are displayed) Labs Reviewed - No data to display  EKG  EKG Interpretation None       Radiology Dg Chest 2 View  Result Date: 07/22/2016 CLINICAL DATA:  Cough and congestion for approximately 1 week. New onset dyspnea last night. EXAM: CHEST  2 VIEW COMPARISON:  05/05/2016 FINDINGS: The lungs are clear. The pulmonary vasculature is normal. Heart size is normal. Hilar and mediastinal contours are unremarkable. There is no pleural effusion. IMPRESSION: No active cardiopulmonary disease. Electronically Signed   By: Ellery Plunk M.D.   On: 07/22/2016 06:15    Procedures Procedures (including  critical care time)  Medications Ordered in ED Medications  ipratropium-albuterol (DUONEB) 0.5-2.5 (3) MG/3ML nebulizer solution 3 mL (3 mLs Nebulization Given 07/22/16 0549)  predniSONE (DELTASONE) tablet 60 mg (60 mg Oral Given 07/22/16 0644)  albuterol (PROVENTIL) (2.5 MG/3ML) 0.083% nebulizer solution 5 mg (5 mg Nebulization Given 07/22/16 0644)     Initial Impression / Assessment and Plan / ED Course  I have reviewed the triage vital signs and the nursing notes.  Pertinent labs & imaging results that were available during my care of the patient were reviewed by me and considered in my medical decision making (see chart for details).  Clinical Course   Patient with history of reactive airway disease and recurrent bronchitis presents with bronchospasm. Patient did have wheezing at arrival but was not hypoxic. Chest x-ray is clear. Patient administered nebulizer treatments with improvement. She will be continued on albuterol as well as prednisone as an outpatient, return as needed.  Final Clinical Impressions(s) / ED  Diagnoses   Final diagnoses:  Bronchitis  Mild intermittent asthma with exacerbation    New Prescriptions Discharge Medication List as of 07/22/2016  6:42 AM    START taking these medications   Details  !! albuterol (PROVENTIL HFA;VENTOLIN HFA) 108 (90 Base) MCG/ACT inhaler Inhale 2 puffs into the lungs every 4 (four) hours as needed for wheezing or shortness of breath., Starting Thu 07/22/2016, Print     !! - Potential duplicate medications found. Please discuss with provider.       Gilda Creasehristopher J Zeynep Fantroy, MD 07/22/16 [id number]

## 2016-09-15 ENCOUNTER — Ambulatory Visit (HOSPITAL_COMMUNITY)
Admission: EM | Admit: 2016-09-15 | Discharge: 2016-09-15 | Disposition: A | Discharge status: Home / Self Care | Payer: BLUE CROSS/BLUE SHIELD | Attending: Family Medicine | Admitting: Family Medicine

## 2016-09-15 ENCOUNTER — Encounter (HOSPITAL_COMMUNITY): Payer: Self-pay | Admitting: *Deleted

## 2016-09-15 DIAGNOSIS — R197 Diarrhea, unspecified: Secondary | ICD-10-CM | POA: Diagnosis not present

## 2016-09-15 DIAGNOSIS — K529 Noninfective gastroenteritis and colitis, unspecified: Secondary | ICD-10-CM | POA: Diagnosis not present

## 2016-09-15 DIAGNOSIS — R110 Nausea: Secondary | ICD-10-CM

## 2016-09-15 MED ORDER — ONDANSETRON 8 MG PO TBDP: 8.0000 mg | ORAL_TABLET | Freq: Three times a day (TID) | ORAL | 0 refills | Status: DC | PRN

## 2016-09-15 MED ORDER — ONDANSETRON 4 MG PO TBDP
4.0000 mg | ORAL_TABLET | Freq: Once | ORAL | Status: AC
  Administered 2016-09-15: 4 mg via ORAL

## 2016-09-15 MED ORDER — ONDANSETRON 4 MG PO TBDP
ORAL_TABLET | ORAL | Status: AC
Start: 2016-09-15 — End: 2016-09-15
  Filled 2016-09-15: qty 1

## 2016-09-15 NOTE — ED Triage Notes (Signed)
Pt  Was   Sen  For bronchitis      Last  Weeks  took  couurse  Of steriods   And is on her  Last  Anti  Biotic pill    That  She was  Prescribed     She  Now  Reports vomiting  And  Diarrhea  For  sev  Days

## 2016-09-30 ENCOUNTER — Ambulatory Visit
Admission: RE | Admit: 2016-09-30 | Discharge: 2016-09-30 | Disposition: A | Discharge status: Home / Self Care | Payer: BLUE CROSS/BLUE SHIELD | Source: Ambulatory Visit | Attending: Family Medicine | Admitting: Family Medicine

## 2016-09-30 ENCOUNTER — Other Ambulatory Visit: Payer: Self-pay | Admitting: Family Medicine

## 2016-09-30 DIAGNOSIS — R140 Abdominal distension (gaseous): Secondary | ICD-10-CM

## 2016-09-30 DIAGNOSIS — R110 Nausea: Secondary | ICD-10-CM

## 2016-09-30 DIAGNOSIS — R109 Unspecified abdominal pain: Secondary | ICD-10-CM

## 2016-10-19 ENCOUNTER — Encounter (HOSPITAL_COMMUNITY): Payer: Self-pay | Admitting: Family Medicine

## 2016-10-19 ENCOUNTER — Emergency Department (HOSPITAL_COMMUNITY)
Admission: EM | Admit: 2016-10-19 | Discharge: 2016-10-20 | Disposition: A | Discharge status: Home / Self Care | Payer: BLUE CROSS/BLUE SHIELD | Attending: Emergency Medicine | Admitting: Emergency Medicine

## 2016-10-19 DIAGNOSIS — R1013 Epigastric pain: Secondary | ICD-10-CM | POA: Diagnosis not present

## 2016-10-19 DIAGNOSIS — R109 Unspecified abdominal pain: Secondary | ICD-10-CM | POA: Diagnosis present

## 2016-10-19 DIAGNOSIS — F17210 Nicotine dependence, cigarettes, uncomplicated: Secondary | ICD-10-CM | POA: Diagnosis not present

## 2016-10-19 NOTE — ED Triage Notes (Addendum)
Patient is complaining of epigastric, lower abd pain and lower back pain for the 1.5 weeks. Patient states she was diagnosed with kidney stone 2 weeks and reports she has not passed it. Pt took a Flexeril yesterday. Frequent urination.

## 2016-10-20 ENCOUNTER — Emergency Department (HOSPITAL_COMMUNITY): Payer: BLUE CROSS/BLUE SHIELD

## 2016-10-20 LAB — COMPREHENSIVE METABOLIC PANEL
ALBUMIN: 3.6 g/dL (ref 3.5–5.0)
ALK PHOS: 69 U/L (ref 38–126)
ALT: 28 U/L (ref 14–54)
AST: 19 U/L (ref 15–41)
Anion gap: 6 (ref 5–15)
BUN: 13 mg/dL (ref 6–20)
CALCIUM: 9 mg/dL (ref 8.9–10.3)
CO2: 25 mmol/L (ref 22–32)
CREATININE: 0.72 mg/dL (ref 0.44–1.00)
Chloride: 108 mmol/L (ref 101–111)
GFR calc Af Amer: >60 mL/min (ref >60)
GFR calc non Af Amer: >60 mL/min (ref >60)
GLUCOSE: 101 mg/dL — AB (ref 65–99)
Potassium: 3.9 mmol/L (ref 3.5–5.1)
SODIUM: 139 mmol/L (ref 135–145)
Total Bilirubin: 0.3 mg/dL (ref 0.3–1.2)
Total Protein: 6.8 g/dL (ref 6.5–8.1)

## 2016-10-20 LAB — CBC
HCT: 41.7 % (ref 36.0–46.0)
HEMOGLOBIN: 14.1 g/dL (ref 12.0–15.0)
MCH: 30.6 pg (ref 26.0–34.0)
MCHC: 33.8 g/dL (ref 30.0–36.0)
MCV: 90.5 fL (ref 78.0–100.0)
Platelets: 235 10*3/uL (ref 150–400)
RBC: 4.61 MIL/uL (ref 3.87–5.11)
RDW: 13.7 % (ref 11.5–15.5)
WBC: 12.5 10*3/uL — ABNORMAL HIGH (ref 4.0–10.5)

## 2016-10-20 LAB — POC URINE PREG, ED: PREG TEST UR: NEGATIVE

## 2016-10-20 LAB — URINALYSIS, ROUTINE W REFLEX MICROSCOPIC
BILIRUBIN URINE: NEGATIVE
Glucose, UA: NEGATIVE mg/dL
HGB URINE DIPSTICK: NEGATIVE
Ketones, ur: NEGATIVE mg/dL
Leukocytes, UA: NEGATIVE
Nitrite: NEGATIVE
Protein, ur: NEGATIVE mg/dL
Specific Gravity, Urine: 1.018 (ref 1.005–1.030)
pH: 7 (ref 5.0–8.0)

## 2016-10-20 LAB — LIPASE, BLOOD: Lipase: 28 U/L (ref 11–51)

## 2016-10-20 MED ORDER — NAPROXEN 500 MG PO TABS: 500.0000 mg | ORAL_TABLET | Freq: Two times a day (BID) | ORAL | 0 refills | Status: DC

## 2016-10-20 MED ORDER — OMEPRAZOLE 20 MG PO CPDR: 20.0000 mg | DELAYED_RELEASE_CAPSULE | Freq: Every day | ORAL | 0 refills | Status: DC

## 2016-10-20 MED ORDER — OXYCODONE-ACETAMINOPHEN 5-325 MG PO TABS
1.0000 | ORAL_TABLET | Freq: Once | ORAL | Status: AC
  Administered 2016-10-20: 1 via ORAL
  Filled 2016-10-20: qty 1

## 2016-10-20 NOTE — Discharge Instructions (Signed)
You were seen today for right-sided back pain and flank pain. You do have stones in her kidneys but no obstructing stones. This is likely not the cause of your pain. Could be related to muscle spasm or nerve pain. He will be given pain medication. He will also be given acid reducing medication giving your postprandial pain. Follow-up with your primary physician.

## 2016-10-20 NOTE — ED Notes (Signed)
Pt saw doctor feb 1  For a( kidney stone) was told to drink plenty of water and would pass it on her own. 2 weeks before was seen for a stomach bug  N/V/D , that's when stomach xray found kidney stone.  Now complains of bilateral lower back pain, and pelvic pain with frequent urination.

## 2016-10-20 NOTE — ED Provider Notes (Signed)
[facility]-EMERGENCY DEPT Provider Note   CSN: [id number] Arrival date & time: 10/19/16  2232  By signing my name below, I, Elder Negus, attest that this documentation has been prepared under the direction and in the presence of Shon Baton, MD. Electronically Signed: Elder Negus, Scribe. 10/20/16. 3:01 AM.   History   Chief Complaint Chief Complaint  Patient presents with  . Abdominal Pain  . Back Pain    HPI Tiffany Conley is a 36 y.o. female with history of sciatica who presents to the ED for evaluation of flank pain. This patient states that for the last several weeks she has experienced L flank pain with diarrhea, and occasional PO intolerance. She saw her primary care on 1/25 who order an abdominal x-ray which demonstrated probable L lower pole nephrolithiasis. The patient presents today stating that she is experiencing frequent urination, night sweating, as well as ongoing 7/10 abdominal pain, diarrhea, and vomiting. Pain is worse following meals. She has not passed a renal stone. She is concerned for UTI as her symptoms are similar to previous presentations.   The history is provided by the patient. No language interpreter was used.    Past Medical History:  Diagnosis Date  . Headache   . Sciatic nerve pain   . UTI (lower urinary tract infection)     Patient Active Problem List   Diagnosis Date Noted  . Occipital neuralgia of right side 04/27/2016  . Common migraine without intractability 04/27/2016  . Neck pain 04/27/2016  . 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 INTERNAL DERANGEMENT, RIGHT KNEE 09/08/2010  . KNEE PAIN 09/08/2010    Past Surgical History:  Procedure Laterality Date  . APPENDECTOMY    . CHOLECYSTECTOMY    . ENDOMETRIAL ABLATION    . KNEE ARTHROSCOPY    . SHOULDER SURGERY Left   . TONSILLECTOMY      OB History    Gravida Para Term Preterm AB Living   0 0 0 0 0 0   SAB TAB Ectopic Multiple Live Births    0 0 0 0         Home Medications    Prior to Admission medications   Medication Sig Start Date End Date Taking? Authorizing Provider  albuterol (PROVENTIL HFA;VENTOLIN HFA) 108 (90 Base) MCG/ACT inhaler Inhale 2 puffs into the lungs every 4 (four) hours as needed for wheezing or shortness of breath. 07/22/16   Gilda Crease, MD  benzonatate (TESSALON) 100 MG capsule Take 2 capsules (200 mg total) by mouth 2 (two) times daily as needed for cough. Patient not taking: Reported on 07/09/2016 05/05/16   Barrett Henle, PA-C  cyclobenzaprine (FLEXERIL) 10 MG tablet Take 1 tablet (10 mg total) by mouth 3 (three) times daily as needed for muscle spasms. Patient not taking: Reported on 07/22/2016 04/27/16   Asa Lente, MD  erythromycin ophthalmic ointment Place a 1/2 inch ribbon of ointment into the lower eyelid. Patient not taking: Reported on 07/09/2016 12/20/15   Ace Gins Sam, PA-C  HYDROcodone-acetaminophen (NORCO/VICODIN) 5-325 MG tablet Take 1 tablet by mouth every 6 (six) hours as needed for severe pain. Patient not taking: Reported on 07/09/2016 07/24/15   Marily Memos, MD  hydrOXYzine (ATARAX/VISTARIL) 25 MG tablet Take 1 tablet (25 mg total) by mouth every 6 (six) hours as needed for itching. Patient not taking: Reported on 07/09/2016 12/20/15   Ace Gins Sam, PA-C  imipramine (TOFRANIL) 25 MG tablet Take 1 tablet (25 mg  total) by mouth at bedtime. Patient not taking: Reported on 07/09/2016 04/27/16   Asa Lente, MD  naproxen (NAPROSYN) 500 MG tablet Take 1 tablet (500 mg total) by mouth 2 (two) times daily. 10/20/16   Shon Baton, MD  omeprazole (PRILOSEC) 20 MG capsule Take 1 capsule (20 mg total) by mouth daily. 10/20/16   Shon Baton, MD  ondansetron (ZOFRAN ODT) 8 MG disintegrating tablet Take 1 tablet (8 mg total) by mouth every 8 (eight) hours as needed for nausea or vomiting. Patient not taking: Reported on 10/20/2016 09/15/16   Deatra Canter, FNP    oxymetazoline [facility] NASAL SPRAY) 0.05 % nasal spray Place 1 spray into both nostrils 2 (two) times daily. You may spray medicine and both nostrils twice daily for up to 3 days. Do not use for more than 3 days to prevent rebound rhinorrhea. Patient not taking: Reported on 07/09/2016 05/05/16   Barrett Henle, PA-C  predniSONE (DELTASONE) 20 MG tablet Take 3 tablets (60 mg total) by mouth daily with breakfast. Patient not taking: Reported on 10/20/2016 07/22/16   Gilda Crease, MD    Family History Family History  Problem Relation Age of Onset  . Cancer Maternal Grandmother     colon  . Congestive Heart Failure Father     Social History Social History  Substance Use Topics  . Smoking status: Current Every Day Smoker    Packs/day: 0.00    Types: Cigarettes  . Smokeless tobacco: Never Used  . Alcohol use No     Allergies   Citrus; Ibuprofen; and Vicodin [hydrocodone-acetaminophen]   Review of Systems Review of Systems  Constitutional: Positive for chills. Negative for fever.       Night sweating  Gastrointestinal: Positive for abdominal pain, diarrhea, nausea and vomiting.  Genitourinary: Positive for frequency. Negative for dysuria and hematuria.  All other systems reviewed and are negative.    Physical Exam Updated Vital Signs BP 130/60 (BP Location: Left Arm)   Pulse 73   Temp 97.8 F (36.6 C) (Oral)   Resp 20   Ht 5\' 6"  (1.676 m)   Wt (!) 350 lb (158.8 kg)   LMP 10/12/2016 Comment: negative urine pregnanct test 10/20/16  SpO2 100%   BMI 56.49 kg/m   Physical Exam  Constitutional: She is oriented to person, place, and time. She appears well-developed and well-nourished.  Obese  HENT:  Head: Normocephalic and atraumatic.  Cardiovascular: Normal rate, regular rhythm and normal heart sounds.   No murmur heard. Pulmonary/Chest: Effort normal and breath sounds normal. No respiratory distress. She has no wheezes.  Abdominal: Soft. Bowel sounds are  normal. There is no tenderness. There is no guarding.  Genitourinary:  Genitourinary Comments: No CVA tenderness  Neurological: She is alert and oriented to person, place, and time.  Skin: Skin is warm and dry.  Psychiatric: She has a normal mood and affect.  Nursing note and vitals reviewed.    ED Treatments / Results  DIAGNOSTIC STUDIES: Oxygen Saturation is 99 percent on room air which is normal by my interpretation.    COORDINATION OF CARE: 12:29 AM Discussed treatment plan with pt at bedside and pt agreed to plan.  Labs (all labs ordered are listed, but only abnormal results are displayed) Labs Reviewed  COMPREHENSIVE METABOLIC PANEL - Abnormal; Notable for the following:       Result Value   Glucose, Bld 101 (*)    All other components within normal limits  CBC -  Abnormal; Notable for the following:    WBC 12.5 (*)    All other components within normal limits  URINALYSIS, ROUTINE W REFLEX MICROSCOPIC - Abnormal; Notable for the following:    APPearance CLOUDY (*)    All other components within normal limits  LIPASE, BLOOD  POC URINE PREG, ED    EKG  EKG Interpretation None       Radiology Ct Renal Stone Study  Result Date: 10/20/2016 CLINICAL DATA:  Right flank pain for several weeks. Urinary frequency. Nausea, vomiting and diarrhea. EXAM: CT ABDOMEN AND PELVIS WITHOUT CONTRAST TECHNIQUE: Multidetector CT imaging of the abdomen and pelvis was performed following the standard protocol without IV contrast. COMPARISON:  05/08/2004 CT FINDINGS: Lower chest: No acute abnormality. Hepatobiliary: Cholecystectomy. No hepatic mass or biliary dilatation for this unenhanced study. Pancreas: No pancreatic ductal dilatation or mass. No peripancreatic inflammation. Spleen: No splenomegaly. Adrenals/Urinary Tract: Normal bilateral adrenal glands. No obstructive uropathy on the right. Numerous mid to lower pole renal calculi are again noted the largest in the lower pole measuring 17  x 10 mm. No hydroureter. Urinary bladder is physiologically distended without calculus. Stomach/Bowel: No bowel obstruction or inflammation.  Appendectomy. Vascular/Lymphatic: No aortic aneurysm or adenopathy. Reproductive: Uterus and bilateral adnexa are unremarkable. Other: No abdominal wall hernia or abnormality. No abdominopelvic ascites. Musculoskeletal: Degenerative disc disease with discogenic sclerosis of the endplates at L4-5. No acute osseous abnormality. IMPRESSION: 1. No obstructive uropathy. Nonobstructing left-sided renal calculi, the largest approximately 17 x 10 mm in the left lower pole. 2. Appendectomy and cholecystectomy. 3. No bowel inflammation or obstruction. 4. Degenerative disc disease L4-5. Electronically Signed   By: Tollie Eth M.D.   On: 10/20/2016 02:34    Procedures Procedures (including critical care time)  Medications Ordered in ED Medications  oxyCODONE-acetaminophen (PERCOCET/ROXICET) 5-325 MG per tablet 1 tablet (1 tablet Oral Given 10/20/16 0210)     Initial Impression / Assessment and Plan / ED Course  I have reviewed the triage vital signs and the nursing notes.  Pertinent labs & imaging results that were available during my care of the patient were reviewed by me and considered in my medical decision making (see chart for details).     Patient presents with right-sided flank pain. Also states that she's recently had some epigastric pain that is worse with eating. She is status post appendectomy and cholecystectomy. Nontoxic. Exam is completely benign. Kidney stone, muscle spasm, reflux are considerations. Lab work is reassuring. No evidence of urinary tract infection. CT stone study obtained.  This shows a stone in the pole of the kidney but no obstructing stones. Otherwise is unremarkable. I discussed the workup with the patient. Will treat with naproxen for possible muscular etiology as well as omeprazole for postprandial discomfort. Follow-up with primary  physician.  After history, exam, and medical workup I feel the patient has been appropriately medically screened and is safe for discharge home. Pertinent diagnoses were discussed with the patient. Patient was given return precautions.   Final Clinical Impressions(s) / ED Diagnoses   Final diagnoses:  Flank pain  Epigastric pain    New Prescriptions New Prescriptions   NAPROXEN (NAPROSYN) 500 MG TABLET    Take 1 tablet (500 mg total) by mouth 2 (two) times daily.   OMEPRAZOLE (PRILOSEC) 20 MG CAPSULE    Take 1 capsule (20 mg total) by mouth daily.   I personally performed the services described in this documentation, which was scribed in my presence. The recorded information  has been reviewed and is accurate.    Shon Batonourtney F Benecio Kluger, MD 10/20/16 [phone]

## 2016-12-28 ENCOUNTER — Emergency Department (HOSPITAL_COMMUNITY)
Admission: EM | Admit: 2016-12-28 | Discharge: 2016-12-28 | Disposition: A | Discharge status: Home / Self Care | Payer: BLUE CROSS/BLUE SHIELD | Attending: Emergency Medicine | Admitting: Emergency Medicine

## 2016-12-28 ENCOUNTER — Encounter (HOSPITAL_COMMUNITY): Payer: Self-pay | Admitting: Emergency Medicine

## 2016-12-28 DIAGNOSIS — G8929 Other chronic pain: Secondary | ICD-10-CM | POA: Insufficient documentation

## 2016-12-28 DIAGNOSIS — M546 Pain in thoracic spine: Secondary | ICD-10-CM | POA: Diagnosis not present

## 2016-12-28 DIAGNOSIS — F17210 Nicotine dependence, cigarettes, uncomplicated: Secondary | ICD-10-CM | POA: Diagnosis not present

## 2016-12-28 DIAGNOSIS — Z79899 Other long term (current) drug therapy: Secondary | ICD-10-CM | POA: Insufficient documentation

## 2016-12-28 LAB — URINALYSIS, ROUTINE W REFLEX MICROSCOPIC
Bilirubin Urine: NEGATIVE
Glucose, UA: NEGATIVE mg/dL
Hgb urine dipstick: NEGATIVE
Ketones, ur: NEGATIVE mg/dL
LEUKOCYTES UA: NEGATIVE
NITRITE: NEGATIVE
PROTEIN: NEGATIVE mg/dL
Specific Gravity, Urine: 1.015 (ref 1.005–1.030)
pH: 5 (ref 5.0–8.0)

## 2016-12-28 LAB — PREGNANCY, URINE: PREG TEST UR: NEGATIVE

## 2016-12-28 MED ORDER — ACETAMINOPHEN 500 MG PO TABS
1000.0000 mg | ORAL_TABLET | Freq: Once | ORAL | Status: AC
  Administered 2016-12-28: 1000 mg via ORAL
  Filled 2016-12-28: qty 2

## 2016-12-28 MED ORDER — IBUPROFEN 400 MG PO TABS
600.0000 mg | ORAL_TABLET | Freq: Once | ORAL | Status: AC
  Administered 2016-12-28: 600 mg via ORAL
  Filled 2016-12-28: qty 1

## 2016-12-28 MED ORDER — NAPROXEN 500 MG PO TABS: 500.0000 mg | ORAL_TABLET | Freq: Two times a day (BID) | ORAL | 0 refills | Status: DC

## 2016-12-28 NOTE — ED Provider Notes (Signed)
[facility]-EMERGENCY DEPT Provider Note   CSN: [id number] Arrival date & time: 12/28/16  0531     History   Chief Complaint Chief Complaint  Patient presents with  . Back Pain    HPI Tiffany Conley is a 36 y.o. female with pertinent PMH of back pain with right sided sciatica, headaches and left kidney stones and PSH of cholecystectomy and appendectomy presents to ED with bilateral thoracic back pain R>L x 3-4 days.  Right sided thoracic pain radiates to the right buttocks.  Aggravating factors include direct palpation, sitting still in one position, standing and certain trunk movement.  Alleviating factors include ibuprofen and naproxen which provide minimal temporary relief.  Patient has also tried water and cranberry juice. Associated symptoms include urinary frequency and nausea.  No fevers, dysuria, hematuria, vaginal discharge, vaginal bleeding, vaginal irritation, vomiting, diarrhea, constipation.  No abdominal or pelvic pain.  LMP 12/16/16.  Patient notes urinary frequency is similar to previous UTIs.  Symptoms are not typical of her right sided back pain/sciatica as there is no radiation to RLE.    HPI  Past Medical History:  Diagnosis Date  . Headache   . Sciatic nerve pain   . UTI (lower urinary tract infection)     Patient Active Problem List   Diagnosis Date Noted  . Occipital neuralgia of right side 04/27/2016  . Common migraine without intractability 04/27/2016  . Neck pain 04/27/2016  . 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 INTERNAL DERANGEMENT, RIGHT KNEE 09/08/2010  . KNEE PAIN 09/08/2010    Past Surgical History:  Procedure Laterality Date  . APPENDECTOMY    . CHOLECYSTECTOMY    . ENDOMETRIAL ABLATION    . KNEE ARTHROSCOPY    . SHOULDER SURGERY Left   . TONSILLECTOMY      OB History    Gravida Para Term Preterm AB Living   0 0 0 0 0 0   SAB TAB Ectopic Multiple Live Births   0 0 0 0         Home Medications    Prior  to Admission medications   Medication Sig Start Date End Date Taking? Authorizing Provider  albuterol (PROVENTIL HFA;VENTOLIN HFA) 108 (90 Base) MCG/ACT inhaler Inhale 2 puffs into the lungs every 4 (four) hours as needed for wheezing or shortness of breath. Patient not taking: Reported on 12/28/2016 07/22/16   Gilda Crease, MD  benzonatate (TESSALON) 100 MG capsule Take 2 capsules (200 mg total) by mouth 2 (two) times daily as needed for cough. Patient not taking: Reported on 07/09/2016 05/05/16   Barrett Henle, PA-C  cyclobenzaprine (FLEXERIL) 10 MG tablet Take 1 tablet (10 mg total) by mouth 3 (three) times daily as needed for muscle spasms. Patient not taking: Reported on 07/22/2016 04/27/16   Asa Lente, MD  erythromycin ophthalmic ointment Place a 1/2 inch ribbon of ointment into the lower eyelid. Patient not taking: Reported on 07/09/2016 12/20/15   Ace Gins Sam, PA-C  HYDROcodone-acetaminophen (NORCO/VICODIN) 5-325 MG tablet Take 1 tablet by mouth every 6 (six) hours as needed for severe pain. Patient not taking: Reported on 07/09/2016 07/24/15   Marily Memos, MD  hydrOXYzine (ATARAX/VISTARIL) 25 MG tablet Take 1 tablet (25 mg total) by mouth every 6 (six) hours as needed for itching. Patient not taking: Reported on 07/09/2016 12/20/15   Ace Gins Sam, PA-C  imipramine (TOFRANIL) 25 MG tablet Take 1 tablet (25 mg total) by mouth at bedtime. Patient not taking:  Reported on 07/09/2016 04/27/16   Asa Lente, MD  naproxen (NAPROSYN) 500 MG tablet Take 1 tablet (500 mg total) by mouth 2 (two) times daily. 12/28/16   Liberty Handy, PA-C  omeprazole (PRILOSEC) 20 MG capsule Take 1 capsule (20 mg total) by mouth daily. Patient not taking: Reported on 12/28/2016 10/20/16   Shon Baton, MD  ondansetron (ZOFRAN ODT) 8 MG disintegrating tablet Take 1 tablet (8 mg total) by mouth every 8 (eight) hours as needed for nausea or vomiting. Patient not taking: Reported on 10/20/2016  09/15/16   Deatra Canter, FNP  oxymetazoline [facility] NASAL SPRAY) 0.05 % nasal spray Place 1 spray into both nostrils 2 (two) times daily. You may spray medicine and both nostrils twice daily for up to 3 days. Do not use for more than 3 days to prevent rebound rhinorrhea. Patient not taking: Reported on 07/09/2016 05/05/16   Barrett Henle, PA-C  predniSONE (DELTASONE) 20 MG tablet Take 3 tablets (60 mg total) by mouth daily with breakfast. Patient not taking: Reported on 10/20/2016 07/22/16   Gilda Crease, MD    Family History Family History  Problem Relation Age of Onset  . Cancer Maternal Grandmother     colon  . Congestive Heart Failure Father     Social History Social History  Substance Use Topics  . Smoking status: Current Every Day Smoker    Packs/day: 0.00    Types: Cigarettes  . Smokeless tobacco: Never Used  . Alcohol use No     Allergies   Citrus; Ibuprofen; and Vicodin [hydrocodone-acetaminophen]   Review of Systems Review of Systems  Constitutional: Negative for appetite change, chills and fever.  HENT: Negative for congestion and sore throat.   Respiratory: Negative for cough, chest tightness and shortness of breath.   Cardiovascular: Negative for chest pain and palpitations.  Gastrointestinal: Positive for nausea. Negative for abdominal pain, constipation, diarrhea and vomiting.  Genitourinary: Positive for flank pain and frequency. Negative for decreased urine volume, difficulty urinating, dysuria, hematuria, menstrual problem, pelvic pain, urgency, vaginal bleeding, vaginal discharge and vaginal pain.  Musculoskeletal: Positive for back pain and myalgias. Negative for arthralgias.  Skin: Negative for rash.  Neurological: Negative for weakness, light-headedness, numbness and headaches.       No tingling in lower extremities  Hematological: Does not bruise/bleed easily.  Psychiatric/Behavioral: Negative.      Physical Exam Updated Vital  Signs BP 121/72 (BP Location: Right Wrist)   Pulse 85   Temp 97.9 F (36.6 C) (Oral)   Resp 18   Ht  (1.676 m)   Wt (!) 158.8 kg   SpO2 97%   BMI 56.49 kg/m   Physical Exam  Constitutional: She is oriented to person, place, and time. She appears well-developed and well-nourished.  Obese  HENT:  Head: Normocephalic and atraumatic.  Nose: Nose normal.  Moist mucous membranes Oropharynx and tonsils normal  Eyes: Conjunctivae and EOM are normal. Pupils are equal, round, and reactive to light.  Neck: Normal range of motion. Neck supple.  Cardiovascular: Normal rate, regular rhythm, normal heart sounds and intact distal pulses.   No murmur heard. Pulmonary/Chest: Effort normal and breath sounds normal. No respiratory distress. She has no wheezes. She has no rales.  No chest wall tenderness  Abdominal: Soft. Bowel sounds are normal. She exhibits no distension and no mass. There is no tenderness. There is no rebound and no guarding.  +Surgical abdominal scars noted.  Large abdomen Active bowel  sounds throughout.  Abdomen is soft, non tender without distention, rigidity, guarding or rebound.  No suprapubic tenderness. No CVAT.  Negative Murphy's. Negative McBurney's.  Non palpable kidneys. No hepatosplenomegaly.   Musculoskeletal: Normal range of motion. She exhibits tenderness. She exhibits no deformity.  Tenderness to right thoracic paraspinal muscles Tenderness to right lumbar paraspinal muscles, right SI joint and right sciatic notch   Lymphadenopathy:    She has no cervical adenopathy.  Neurological: She is alert and oriented to person, place, and time. No sensory deficit.  Gait normal. No foot drop.  5/5 strength with hip flexion and extension, bilaterally.  5/5 strength with knee flexion and extension, bilaterally.  5/5 strength with ankle dorsiflexion and plantar flexion, bilaterally.  Sensation to light touch intact in the distribution of the obturator nerve, lateral  cutaneous nerve, femoral nerve, common fibular nerve.     Foot: sensation to light touch intact in the distribution of the saphenous nerve, medial plantar nerve, lateral plantar nerve, bilaterally.   Skin: Skin is warm and dry. Capillary refill takes less than 2 seconds.  Psychiatric: She has a normal mood and affect. Her behavior is normal. Judgment and thought content normal.  Nursing note and vitals reviewed.    ED Treatments / Results  Labs (all labs ordered are listed, but only abnormal results are displayed) Labs Reviewed  URINALYSIS, ROUTINE W REFLEX MICROSCOPIC - Abnormal; Notable for the following:       Result Value   APPearance HAZY (*)    All other components within normal limits  PREGNANCY, URINE    EKG  EKG Interpretation None       Radiology No results found.  Procedures Procedures (including critical care time)  Medications Ordered in ED Medications  acetaminophen (TYLENOL) tablet 1,000 mg (1,000 mg Oral Given 12/28/16 0725)  ibuprofen (ADVIL,MOTRIN) tablet 600 mg (600 mg Oral Given 12/28/16 0724)     Initial Impression / Assessment and Plan / ED Course  I have reviewed the triage vital signs and the nursing notes.  Pertinent labs & imaging results that were available during my care of the patient were reviewed by me and considered in my medical decision making (see chart for details).   36 yo female with pertinent PMH of back pain with right sided sciatica and left kidney stones and PSH of cholecystectomy and appendectomy presents to ED with bilateral thoracic back pain R>L associated with urinary frequency and nausea x 3-4 days. On exam VS are wnl without fever.  Patient is non toxic appearing, appears comfortable.  Abdomen is soft, NTND, no suprapubic tenderness or CVAT.  Right sided thoracic/lumbar pain is reproducible with deep palpation. No lower extremity neurological deficits.  Initial differential diagnoses include muscular strain, UTI, renal  calculi and less likely pyelonephritis.  Doubt pancreatitis or biliary etiology given no abdominal tenderness. Doubt dissection, normal blood pressure.   Urinalysis negative. Urine preg negative. Patient had CT renal study two months ago that showed small, non-obstructing stones on the left and none on the right.   Suspect muscular etiology.  I do not think patient needs further emergent imaging or lab work at this time.  She is non toxic appearing and tolerating PO.  Will discharge with NSAIDs, rest, heat, massage.  Discussed results and plan to discharge with patient who was agreeable, requested work note, given.  Educated patient on symptoms to monitor that would suggest kidney stone, UTI, pyelo. ED return precautions given.  Patient verbalized understanding and is agreeable to  plan.   Patient, ED treatment and discharge plan was discussed with supervising physician who is agreeable with plan.   Final Clinical Impressions(s) / ED Diagnoses   Final diagnoses:  Chronic bilateral thoracic back pain    New Prescriptions Current Discharge Medication List       Liberty Handy, PA-C 12/28/16 4098    Zadie Rhine, MD 12/29/16 [phone]

## 2016-12-28 NOTE — ED Notes (Signed)
EDP at bedside  

## 2016-12-28 NOTE — ED Triage Notes (Signed)
Pt in POV reporting bilateral flank pain, worse on R side with frequent urination. Hx recurrent UTI. Afebrile.

## 2016-12-28 NOTE — Discharge Instructions (Signed)
Your pregnancy test and urinalysis were negative today.  It is unlikely that you have a right sided kidney stone, bladder infection or kidney infection.  Your CT scan two months ago showed small non obstructing kidney stones in the LEFT side, and it is unlikely that you developed a right sided obstructing kidney stone in less than two month without any signs of blood in your urine.   We will treat your symptoms with over the counter pain medications, heat, and light massage.   Please take 600 mg ibuprofen + 1000 mg tylenol every 6 hours for the next 3-4 days.  Avoid any exertional activity that exacerbate your pain.   Monitor your symptoms, return to the emergency department if your pain is severe, you develop nausea and vomiting, or notice any blood in your urine. Please read attached information on kidney stones so you are aware of symptoms to monitor for.  Please see above contact information to establish care with a new primary care provider.

## 2017-04-01 ENCOUNTER — Encounter (HOSPITAL_COMMUNITY): Payer: Self-pay | Admitting: Emergency Medicine

## 2017-04-01 ENCOUNTER — Emergency Department (HOSPITAL_COMMUNITY)
Admission: EM | Admit: 2017-04-01 | Discharge: 2017-04-01 | Disposition: A | Discharge status: Home / Self Care | Payer: 59 | Attending: Emergency Medicine | Admitting: Emergency Medicine

## 2017-04-01 ENCOUNTER — Emergency Department (HOSPITAL_COMMUNITY): Payer: 59

## 2017-04-01 DIAGNOSIS — F17210 Nicotine dependence, cigarettes, uncomplicated: Secondary | ICD-10-CM | POA: Insufficient documentation

## 2017-04-01 DIAGNOSIS — Z885 Allergy status to narcotic agent status: Secondary | ICD-10-CM | POA: Diagnosis not present

## 2017-04-01 DIAGNOSIS — M545 Low back pain: Secondary | ICD-10-CM | POA: Insufficient documentation

## 2017-04-01 DIAGNOSIS — Z79899 Other long term (current) drug therapy: Secondary | ICD-10-CM | POA: Diagnosis not present

## 2017-04-01 DIAGNOSIS — Z711 Person with feared health complaint in whom no diagnosis is made: Secondary | ICD-10-CM

## 2017-04-01 DIAGNOSIS — R102 Pelvic and perineal pain: Secondary | ICD-10-CM

## 2017-04-01 LAB — URINALYSIS, ROUTINE W REFLEX MICROSCOPIC
BILIRUBIN URINE: NEGATIVE
GLUCOSE, UA: NEGATIVE mg/dL
Hgb urine dipstick: NEGATIVE
KETONES UR: NEGATIVE mg/dL
LEUKOCYTES UA: NEGATIVE
Nitrite: NEGATIVE
PH: 5 (ref 5.0–8.0)
Protein, ur: NEGATIVE mg/dL
SPECIFIC GRAVITY, URINE: 1.021 (ref 1.005–1.030)

## 2017-04-01 LAB — COMPREHENSIVE METABOLIC PANEL
ALBUMIN: 3.5 g/dL (ref 3.5–5.0)
ALT: 25 U/L (ref 14–54)
AST: 22 U/L (ref 15–41)
Alkaline Phosphatase: 59 U/L (ref 38–126)
Anion gap: 11 (ref 5–15)
BUN: 8 mg/dL (ref 6–20)
CHLORIDE: 104 mmol/L (ref 101–111)
CO2: 22 mmol/L (ref 22–32)
Calcium: 9 mg/dL (ref 8.9–10.3)
Creatinine, Ser: 0.78 mg/dL (ref 0.44–1.00)
GFR calc Af Amer: >60 mL/min (ref >60)
GFR calc non Af Amer: >60 mL/min (ref >60)
GLUCOSE: 110 mg/dL — AB (ref 65–99)
POTASSIUM: 4.1 mmol/L (ref 3.5–5.1)
SODIUM: 137 mmol/L (ref 135–145)
Total Bilirubin: 0.6 mg/dL (ref 0.3–1.2)
Total Protein: 6.5 g/dL (ref 6.5–8.1)

## 2017-04-01 LAB — WET PREP, GENITAL
CLUE CELLS WET PREP: NONE SEEN
Sperm: NONE SEEN
Trich, Wet Prep: NONE SEEN
Yeast Wet Prep HPF POC: NONE SEEN

## 2017-04-01 LAB — CBC
HEMATOCRIT: 42.4 % (ref 36.0–46.0)
Hemoglobin: 14.4 g/dL (ref 12.0–15.0)
MCH: 31.2 pg (ref 26.0–34.0)
MCHC: 34 g/dL (ref 30.0–36.0)
MCV: 92 fL (ref 78.0–100.0)
Platelets: 237 10*3/uL (ref 150–400)
RBC: 4.61 MIL/uL (ref 3.87–5.11)
RDW: 13.5 % (ref 11.5–15.5)
WBC: 9.9 10*3/uL (ref 4.0–10.5)

## 2017-04-01 LAB — PREGNANCY, URINE: Preg Test, Ur: NEGATIVE

## 2017-04-01 LAB — LIPASE, BLOOD: LIPASE: 32 U/L (ref 11–51)

## 2017-04-01 MED ORDER — CEFTRIAXONE SODIUM 250 MG IJ SOLR
250.0000 mg | Freq: Once | INTRAMUSCULAR | Status: AC
  Administered 2017-04-01: 250 mg via INTRAMUSCULAR
  Filled 2017-04-01: qty 250

## 2017-04-01 MED ORDER — AZITHROMYCIN 250 MG PO TABS
1000.0000 mg | ORAL_TABLET | Freq: Once | ORAL | Status: AC
  Administered 2017-04-01: 1000 mg via ORAL
  Filled 2017-04-01: qty 4

## 2017-04-01 MED ORDER — LIDOCAINE HCL (PF) 1 % IJ SOLN
INTRAMUSCULAR | Status: AC
  Administered 2017-04-01: 0.9 mL
  Filled 2017-04-01: qty 5

## 2017-04-01 MED ORDER — ONDANSETRON 4 MG PO TBDP
ORAL_TABLET | ORAL | Status: AC
  Filled 2017-04-01: qty 1

## 2017-04-01 MED ORDER — ONDANSETRON 4 MG PO TBDP
4.0000 mg | ORAL_TABLET | Freq: Once | ORAL | Status: AC | PRN
  Administered 2017-04-01: 4 mg via ORAL

## 2017-04-01 MED ORDER — ONDANSETRON 4 MG PO TBDP: 4.0000 mg | ORAL_TABLET | Freq: Three times a day (TID) | ORAL | 0 refills | Status: DC | PRN

## 2017-04-01 MED ORDER — FENTANYL CITRATE (PF) 100 MCG/2ML IJ SOLN
100.0000 ug | Freq: Once | INTRAMUSCULAR | Status: AC
  Administered 2017-04-01: 100 ug via INTRAVENOUS
  Filled 2017-04-01: qty 2

## 2017-04-01 MED ORDER — NAPROXEN 500 MG PO TABS: 500.0000 mg | ORAL_TABLET | Freq: Two times a day (BID) | ORAL | 0 refills | Status: DC

## 2017-04-01 NOTE — ED Provider Notes (Signed)
[facility]-EMERGENCY DEPT Provider Note   CSN: [id number] Arrival date & time: 04/01/17  0546     History   Chief Complaint Chief Complaint  Patient presents with  . Abdominal Pain  . Back Pain    HPI Tiffany Conley is a 36 y.o. female.  Tiffany Conley is a 36 y.o. Female who presents to the emergency department complaining of bilateral lower abdominal pain that is worse in her right side with onset when she woke up this morning. Patient reports she woke up and had gradual onset of bilateral lower abdominal pain that is worse in the right lower quadrant. She reports her pain seems to worse with movement. Last menstrual cycle was a week ago. She reports some slight vaginal discharge which is normal for her. She reports her pain is constant and does not fluctuate randomly in intensity. Previous abdominal surgical history includes cholecystectomy and an appendectomy. She reports she has not been sexually active in 6 months. She reports nausea and 2 episodes of vomiting this morning. No diarrhea. She reports chronic low back pain that is unchanged.  She denies fevers, chest pain, shortness of breath, hematemesis, hematochezia, urinary symptoms, or rashes   The history is provided by the patient and medical records. No language interpreter was used.  Abdominal Pain   Associated symptoms include nausea and vomiting. Pertinent negatives include fever, diarrhea, dysuria, frequency, hematuria and headaches.  Back Pain   Associated symptoms include abdominal pain. Pertinent negatives include no chest pain, no fever, no headaches and no dysuria.    Past Medical History:  Diagnosis Date  . Headache   . Sciatic nerve pain   . UTI (lower urinary tract infection)     Patient Active Problem List   Diagnosis Date Noted  . Occipital neuralgia of right side 04/27/2016  . Common migraine without intractability 04/27/2016  . Neck pain 04/27/2016  . 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 INTERNAL DERANGEMENT, RIGHT KNEE 09/08/2010  . KNEE PAIN 09/08/2010    Past Surgical History:  Procedure Laterality Date  . APPENDECTOMY    . CHOLECYSTECTOMY    . ENDOMETRIAL ABLATION    . KNEE ARTHROSCOPY    . SHOULDER SURGERY Left   . TONSILLECTOMY      OB History    Gravida Para Term Preterm AB Living   0 0 0 0 0 0   SAB TAB Ectopic Multiple Live Births   0 0 0 0         Home Medications    Prior to Admission medications   Medication Sig Start Date End Date Taking? Authorizing Provider  albuterol (PROVENTIL HFA;VENTOLIN HFA) 108 (90 Base) MCG/ACT inhaler Inhale 2 puffs into the lungs every 4 (four) hours as needed for wheezing or shortness of breath. Patient not taking: Reported on 12/28/2016 07/22/16   Gilda Crease, MD  benzonatate (TESSALON) 100 MG capsule Take 2 capsules (200 mg total) by mouth 2 (two) times daily as needed for cough. Patient not taking: Reported on 07/09/2016 05/05/16   Barrett Henle, PA-C  cyclobenzaprine (FLEXERIL) 10 MG tablet Take 1 tablet (10 mg total) by mouth 3 (three) times daily as needed for muscle spasms. Patient not taking: Reported on 07/22/2016 04/27/16   Asa Lente, MD  erythromycin ophthalmic ointment Place a 1/2 inch ribbon of ointment into the lower eyelid. Patient not taking: Reported on 07/09/2016 12/20/15   Carlene Coria, PA-C  HYDROcodone-acetaminophen (NORCO/VICODIN) 5-325 MG tablet Take 1  tablet by mouth every 6 (six) hours as needed for severe pain. Patient not taking: Reported on 07/09/2016 07/24/15   Mesner, Barbara CowerJason, MD  hydrOXYzine (ATARAX/VISTARIL) 25 MG tablet Take 1 tablet (25 mg total) by mouth every 6 (six) hours as needed for itching. Patient not taking: Reported on 07/09/2016 12/20/15   Sam, Ace GinsSerena Y, PA-C  imipramine (TOFRANIL) 25 MG tablet Take 1 tablet (25 mg total) by mouth at bedtime. Patient not taking: Reported on 07/09/2016 04/27/16   Asa LenteSater, Richard A, MD  naproxen (NAPROSYN)  500 MG tablet Take 1 tablet (500 mg total) by mouth 2 (two) times daily with a meal. 04/01/17   Everlene Farrieransie, Glendale Youngblood, PA-C  omeprazole (PRILOSEC) 20 MG capsule Take 1 capsule (20 mg total) by mouth daily. Patient not taking: Reported on 12/28/2016 10/20/16   Horton, Mayer Maskerourtney F, MD  ondansetron (ZOFRAN ODT) 4 MG disintegrating tablet Take 1 tablet (4 mg total) by mouth every 8 (eight) hours as needed for nausea or vomiting. 04/01/17   Everlene Farrieransie, Jamale Spangler, PA-C  oxymetazoline (AFRIN NASAL SPRAY) 0.05 % nasal spray Place 1 spray into both nostrils 2 (two) times daily. You may spray medicine and both nostrils twice daily for up to 3 days. Do not use for more than 3 days to prevent rebound rhinorrhea. Patient not taking: Reported on 07/09/2016 05/05/16   Barrett HenleNadeau, Nicole Elizabeth, PA-C  predniSONE (DELTASONE) 20 MG tablet Take 3 tablets (60 mg total) by mouth daily with breakfast. Patient not taking: Reported on 10/20/2016 07/22/16   Gilda CreasePollina, Christopher J, MD    Family History Family History  Problem Relation Age of Onset  . Cancer Maternal Grandmother        colon  . Congestive Heart Failure Father     Social History Social History  Substance Use Topics  . Smoking status: Current Every Day Smoker    Packs/day: 0.00    Types: Cigarettes  . Smokeless tobacco: Never Used  . Alcohol use No     Allergies   Citrus; Ibuprofen; and Vicodin [hydrocodone-acetaminophen]   Review of Systems Review of Systems  Constitutional: Negative for chills and fever.  HENT: Negative for congestion and sore throat.   Eyes: Negative for visual disturbance.  Respiratory: Negative for cough and shortness of breath.   Cardiovascular: Negative for chest pain and palpitations.  Gastrointestinal: Positive for abdominal pain, nausea and vomiting. Negative for diarrhea.  Genitourinary: Positive for vaginal discharge. Negative for difficulty urinating, dysuria, frequency, hematuria, urgency and vaginal bleeding.    Musculoskeletal: Positive for back pain (chronic and unchanged.). Negative for neck pain.  Skin: Negative for rash.  Neurological: Negative for light-headedness and headaches.     Physical Exam Updated Vital Signs BP 112/79   Pulse 71   Temp 98.3 F (36.8 C) (Oral)   Resp 18   LMP 03/27/2017 (Exact Date)   SpO2 100%   Physical Exam  Constitutional: She appears well-developed and well-nourished. No distress.  Nontoxic appearing. Obese female.  HENT:  Head: Normocephalic and atraumatic.  Mouth/Throat: Oropharynx is clear and moist.  Eyes: Pupils are equal, round, and reactive to light. Conjunctivae are normal. Right eye exhibits no discharge. Left eye exhibits no discharge.  Neck: Neck supple.  Cardiovascular: Normal rate, regular rhythm, normal heart sounds and intact distal pulses.  Exam reveals no gallop and no friction rub.   No murmur heard. Pulmonary/Chest: Effort normal and breath sounds normal. No respiratory distress. She has no wheezes. She has no rales.  Abdominal: Soft. Bowel sounds are  normal. She exhibits no distension and no mass. There is tenderness. There is no rebound and no guarding.  Abdomen soft. Bowel sounds are present. Patient has suprapubic and right lower quadrant abdominal tenderness to palpation. No peritoneal signs. No CVA tenderness.  Genitourinary:  Genitourinary Comments: Pelvic exam with female nurse tech as chaperone. No external lesions or rashes noted. A mild amount of vaginal discharge noted. Cervix is closed. No cervical motion tenderness. Mild suprapubic and right adnexal tenderness to palpation. No vaginal bleeding.  Musculoskeletal: She exhibits no edema.  Lymphadenopathy:    She has no cervical adenopathy.  Neurological: She is alert. Coordination normal.  Skin: Skin is warm and dry. No rash noted. She is not diaphoretic. No erythema. No pallor.  Psychiatric: She has a normal mood and affect. Her behavior is normal.  Nursing note and  vitals reviewed.    ED Treatments / Results  Labs (all labs ordered are listed, but only abnormal results are displayed) Labs Reviewed  WET PREP, GENITAL - Abnormal; Notable for the following:       Result Value   WBC, Wet Prep HPF POC MODERATE (*)    All other components within normal limits  COMPREHENSIVE METABOLIC PANEL - Abnormal; Notable for the following:    Glucose, Bld 110 (*)    All other components within normal limits  LIPASE, BLOOD  CBC  URINALYSIS, ROUTINE W REFLEX MICROSCOPIC  PREGNANCY, URINE  RPR  HIV ANTIBODY (ROUTINE TESTING)  GC/CHLAMYDIA PROBE AMP ([place]) NOT AT [facility]    EKG  EKG Interpretation None       Radiology US Transvaginal Non-ob  Result Date: 04/01/2017 CLINICAL DATA:  Acute back and pelvic pain. EXAM: TRANSABDOMINAL AND TRANSVAGINAL ULTRASOUND OF PELVIS DOPPLER ULTRASOUND OF OVARIES TECHNIQUE: Both transabdominal and transvaginal ultrasound examinations of the pelvis were performed. Transabdominal technique was performed for global imaging of the pelvis including uterus, ovaries, adnexal regions, and pelvic cul-de-sac. It was necessary to proceed with endovaginal exam following the transabdominal exam to visualize the uterus and ovaries. Color and duplex Doppler ultrasound was utilized to evaluate blood flow to the ovaries. COMPARISON:  None. FINDINGS: Uterus Measurements: 6.8 x 3.8 x 4.6 cm. No fibroids or other mass visualized. Endometrium Thickness: 2.2 cm.  No focal abnormality visualized. Right ovary Not visualized. Left ovary Measurements: 2.6 x 1.9 x 1.6 cm. Normal appearance/no adnexal mass. Pulsed Doppler evaluation of the left ovary demonstrates normal low-resistance arterial and venous waveforms. Other findings Trace free pelvic fluid . IMPRESSION: Trace free pelvic fluid. Right ovary not visualized. Exam otherwise normal. Electronically Signed   By: Maisie Fus  Register   On: 04/01/2017 11:15   US Pelvis Complete  Result Date:  04/01/2017 CLINICAL DATA:  Acute back and pelvic pain. EXAM: TRANSABDOMINAL AND TRANSVAGINAL ULTRASOUND OF PELVIS DOPPLER ULTRASOUND OF OVARIES TECHNIQUE: Both transabdominal and transvaginal ultrasound examinations of the pelvis were performed. Transabdominal technique was performed for global imaging of the pelvis including uterus, ovaries, adnexal regions, and pelvic cul-de-sac. It was necessary to proceed with endovaginal exam following the transabdominal exam to visualize the uterus and ovaries. Color and duplex Doppler ultrasound was utilized to evaluate blood flow to the ovaries. COMPARISON:  None. FINDINGS: Uterus Measurements: 6.8 x 3.8 x 4.6 cm. No fibroids or other mass visualized. Endometrium Thickness: 2.2 cm.  No focal abnormality visualized. Right ovary Not visualized. Left ovary Measurements: 2.6 x 1.9 x 1.6 cm. Normal appearance/no adnexal mass. Pulsed Doppler evaluation of the left ovary demonstrates normal low-resistance arterial  and venous waveforms. Other findings Trace free pelvic fluid . IMPRESSION: Trace free pelvic fluid. Right ovary not visualized. Exam otherwise normal. Electronically Signed   By: Maisie Fushomas  Register   On: 04/01/2017 11:15   [place] Art/ven Flow Abd Pelv Doppler  Result Date: 04/01/2017 CLINICAL DATA:  Acute back and pelvic pain. EXAM: TRANSABDOMINAL AND TRANSVAGINAL ULTRASOUND OF PELVIS DOPPLER ULTRASOUND OF OVARIES TECHNIQUE: Both transabdominal and transvaginal ultrasound examinations of the pelvis were performed. Transabdominal technique was performed for global imaging of the pelvis including uterus, ovaries, adnexal regions, and pelvic cul-de-sac. It was necessary to proceed with endovaginal exam following the transabdominal exam to visualize the uterus and ovaries. Color and duplex Doppler ultrasound was utilized to evaluate blood flow to the ovaries. COMPARISON:  None. FINDINGS: Uterus Measurements: 6.8 x 3.8 x 4.6 cm. No fibroids or other mass visualized. Endometrium  Thickness: 2.2 cm.  No focal abnormality visualized. Right ovary Not visualized. Left ovary Measurements: 2.6 x 1.9 x 1.6 cm. Normal appearance/no adnexal mass. Pulsed Doppler evaluation of the left ovary demonstrates normal low-resistance arterial and venous waveforms. Other findings Trace free pelvic fluid . IMPRESSION: Trace free pelvic fluid. Right ovary not visualized. Exam otherwise normal. Electronically Signed   By: Maisie Fushomas  Register   On: 04/01/2017 11:15    Procedures Procedures (including critical care time)  Medications Ordered in ED Medications  ondansetron (ZOFRAN-ODT) 4 MG disintegrating tablet (not administered)  cefTRIAXone (ROCEPHIN) injection 250 mg (not administered)  azithromycin (ZITHROMAX) tablet 1,000 mg (not administered)  ondansetron (ZOFRAN-ODT) disintegrating tablet 4 mg (4 mg Oral Given 04/01/17 0607)  fentaNYL (SUBLIMAZE) injection 100 mcg (100 mcg Intravenous Given 04/01/17 1210)     Initial Impression / Assessment and Plan / ED Course  I have reviewed the triage vital signs and the nursing notes.  Pertinent labs & imaging results that were available during my care of the patient were reviewed by me and considered in my medical decision making (see chart for details).    This is a 36 y.o. Female who presents to the emergency department complaining of bilateral lower abdominal pain that is worse in her right side with onset when she woke up this morning. Patient reports she woke up and had gradual onset of bilateral lower abdominal pain that is worse in the right lower quadrant. She reports her pain seems to worse with movement. Last menstrual cycle was a week ago. She reports some slight vaginal discharge which is normal for her. She reports her pain is constant and does not fluctuate randomly in intensity. Previous abdominal surgical history includes cholecystectomy and an appendectomy. She reports she has not been sexually active in 6 months. She reports nausea and  2 episodes of vomiting this morning. No diarrhea. She reports chronic low back pain that is unchanged.  On exam the patient is afebrile and nontoxic appearing. She is no obese female. She has tenderness to suprapubic and right lower quadrant area. No peritoneal signs. No CVA tenderness. Pregnancy test is negative. Urinalysis is within normal limits. No hematuria. No evidence of infection. Lipase, CMP and CBC are unremarkable. No leukocytosis. On pelvic exam the patient has a mild amount of vaginal discharge. No cervical motion tenderness. She has suprapubic and right adnexal tenderness to palpation. No vaginal bleeding. Pelvic ultrasound was obtained. There is a trace amount of free pelvic fluid. Left ovary is normal. No evidence of torsion. Right ovary was not visualized. Exam is otherwise normal. At reevaluation patient tells me she is feeling much  better. Based on history and urinalysis I doubt kidney stone. I also doubt ovarian torsion as patient had gradual onset of pain. I discussed test results with her and that we were unable to visualize the right ovary and ultrasound. I discussed the possibility that there could be towards and or an abscess however feel like this is unlikely. We discussed we could do further imaging, but patient declines. For this is reasonable. She is feeling better. Wet prep showed moderate white blood cells. I discussed the possibility of this being an STD. Patient would like to go ahead with antibiotic treatment for possible STD with Rocephin and azithromycin. I discussed that she has pending testing for chlamydia, gonorrhea, HIV and syphilis. I advised she needs to follow-up on these test results. She agrees. I discussed strict and specific return precautions with the patient. I advised the patient to follow-up with their primary care provider this week. I advised the patient to return to the emergency department with new or worsening symptoms or new concerns. The patient  verbalized understanding and agreement with plan.    This patient was discussed with Dr. Rosalia Hammers who agrees with assessment and plan.   Final Clinical Impressions(s) / ED Diagnoses   Final diagnoses:  Pelvic pain in female  Concern about STD in female without diagnosis    New Prescriptions New Prescriptions   NAPROXEN (NAPROSYN) 500 MG TABLET    Take 1 tablet (500 mg total) by mouth 2 (two) times daily with a meal.   ONDANSETRON (ZOFRAN ODT) 4 MG DISINTEGRATING TABLET    Take 1 tablet (4 mg total) by mouth every 8 (eight) hours as needed for nausea or vomiting.     Everlene Farrier, PA-C 04/01/17 1257    Margarita Grizzle, MD 04/01/17 1540

## 2017-04-01 NOTE — ED Triage Notes (Signed)
Pt reports back pain that's chronic in nature and lower abdominal pain starting this morning. States sharp in nature, emesis x2 and diarrhea. LMP Sunday.

## 2017-04-02 LAB — RPR: RPR Ser Ql: NONREACTIVE

## 2017-04-02 LAB — HIV ANTIBODY (ROUTINE TESTING W REFLEX): HIV Screen 4th Generation wRfx: NONREACTIVE

## 2017-04-04 LAB — GC/CHLAMYDIA PROBE AMP (~~LOC~~) NOT AT ARMC
Chlamydia: NEGATIVE
Neisseria Gonorrhea: NEGATIVE

## 2017-04-05 ENCOUNTER — Inpatient Hospital Stay (HOSPITAL_COMMUNITY)
Admission: AD | Admit: 2017-04-05 | Discharge: 2017-04-05 | Disposition: A | Discharge status: Home / Self Care | Payer: 59 | Source: Ambulatory Visit | Attending: Family Medicine | Admitting: Family Medicine

## 2017-04-05 DIAGNOSIS — K30 Functional dyspepsia: Secondary | ICD-10-CM | POA: Diagnosis not present

## 2017-04-05 DIAGNOSIS — Z885 Allergy status to narcotic agent status: Secondary | ICD-10-CM | POA: Insufficient documentation

## 2017-04-05 DIAGNOSIS — M5431 Sciatica, right side: Secondary | ICD-10-CM | POA: Diagnosis not present

## 2017-04-05 DIAGNOSIS — R1031 Right lower quadrant pain: Secondary | ICD-10-CM | POA: Diagnosis not present

## 2017-04-05 DIAGNOSIS — M549 Dorsalgia, unspecified: Secondary | ICD-10-CM | POA: Diagnosis present

## 2017-04-05 DIAGNOSIS — F17210 Nicotine dependence, cigarettes, uncomplicated: Secondary | ICD-10-CM | POA: Diagnosis not present

## 2017-04-05 LAB — CBC WITH DIFFERENTIAL/PLATELET
BASOS ABS: 0 10*3/uL (ref 0.0–0.1)
Basophils Relative: 0 %
Eosinophils Absolute: 0.3 10*3/uL (ref 0.0–0.7)
Eosinophils Relative: 3 %
HEMATOCRIT: 40.9 % (ref 36.0–46.0)
Hemoglobin: 13.8 g/dL (ref 12.0–15.0)
LYMPHS ABS: 3.8 10*3/uL (ref 0.7–4.0)
Lymphocytes Relative: 39 %
MCH: 31.6 pg (ref 26.0–34.0)
MCHC: 33.7 g/dL (ref 30.0–36.0)
MCV: 93.6 fL (ref 78.0–100.0)
MONO ABS: 0.5 10*3/uL (ref 0.1–1.0)
Monocytes Relative: 5 %
NEUTROS ABS: 5 10*3/uL (ref 1.7–7.7)
Neutrophils Relative %: 53 %
Platelets: 207 10*3/uL (ref 150–400)
RBC: 4.37 MIL/uL (ref 3.87–5.11)
RDW: 13.6 % (ref 11.5–15.5)
WBC: 9.6 10*3/uL (ref 4.0–10.5)

## 2017-04-05 LAB — URINALYSIS, ROUTINE W REFLEX MICROSCOPIC
Bilirubin Urine: NEGATIVE
GLUCOSE, UA: NEGATIVE mg/dL
Hgb urine dipstick: NEGATIVE
Ketones, ur: NEGATIVE mg/dL
LEUKOCYTES UA: NEGATIVE
Nitrite: NEGATIVE
PH: 6 (ref 5.0–8.0)
Protein, ur: NEGATIVE mg/dL
SPECIFIC GRAVITY, URINE: 1.013 (ref 1.005–1.030)

## 2017-04-05 MED ORDER — OXYCODONE-ACETAMINOPHEN 5-325 MG PO TABS: 1.0000 | ORAL_TABLET | Freq: Four times a day (QID) | ORAL | 0 refills | Status: DC | PRN

## 2017-04-05 NOTE — MAU Note (Signed)
Having pain in lower back and lower rt abd.  Went to [facility] on Fri, had an US, was unable to see rt ovary, could find no reason for pain.

## 2017-04-05 NOTE — MAU Provider Note (Signed)
History     CSN: [id number]  Arrival date and time: 04/05/17 1215   First Provider Initiated Contact with Patient 04/05/17 1340      Chief Complaint  Patient presents with  . Abdominal Pain  . Back Pain   HPI  Ms. Tiffany Conley is a 36 y.o. [id number] who presents to MAU today with complaint of right sided back pain that radiates to her right leg. The patient states that she has also had associated numbness and tingling of her right thigh and lower leg. She states back pain x years, but much worse for the last 2 weeks. [facility] with new onset RLQ abdominal pain in addition to the back and leg symptoms. She had a normal wet prep, GC/Chlamydia, CBC, CMP, Lipase and pelvic US at that time. She was told to follow-up with PCP per the note, but the patient states that she was told to come here if symptoms worsened.  She has tried ibuprofen and Naproxen for pain with minimal relief. She was treated presumptively for STDs on 04/01/17 and has noted some GI upset since then that she believes is related to the antibiotics. She states some nausea and occasional emesis with loose stool since yesterday. She denies bloody in her stools or fever. She has a past surgical history of cholecystectomy and appendectomy.   OB History    Gravida Para Term Preterm AB Living   0 0 0 0 0 0   SAB TAB Ectopic Multiple Live Births   0 0 0 0        Past Medical History:  Diagnosis Date  . Headache   . Sciatic nerve pain   . UTI (lower urinary tract infection)     Past Surgical History:  Procedure Laterality Date  . APPENDECTOMY    . CHOLECYSTECTOMY    . ENDOMETRIAL ABLATION    . KNEE ARTHROSCOPY    . SHOULDER SURGERY Left   . TONSILLECTOMY      Family History  Problem Relation Age of Onset  . Cancer Maternal Grandmother        colon  . Congestive Heart Failure Father     Social History  Substance Use Topics  . Smoking status: Current Every Day Smoker    Packs/day: 0.00     Types: Cigarettes  . Smokeless tobacco: Never Used  . Alcohol use No    Allergies:  Allergies  Allergen Reactions  . Citrus Itching and Rash  . Ibuprofen Nausea Only  . Vicodin [Hydrocodone-Acetaminophen] Nausea And Vomiting    Prescriptions Prior to Admission  Medication Sig Dispense Refill Last Dose  . albuterol (PROVENTIL HFA;VENTOLIN HFA) 108 (90 Base) MCG/ACT inhaler Inhale 2 puffs into the lungs every 4 (four) hours as needed for wheezing or shortness of breath. (Patient not taking: Reported on 12/28/2016) 1 Inhaler 2 Not Taking at Unknown time  . benzonatate (TESSALON) 100 MG capsule Take 2 capsules (200 mg total) by mouth 2 (two) times daily as needed for cough. (Patient not taking: Reported on 07/09/2016) 20 capsule 0 Not Taking at Unknown time  . cyclobenzaprine (FLEXERIL) 10 MG tablet Take 1 tablet (10 mg total) by mouth 3 (three) times daily as needed for muscle spasms. (Patient not taking: Reported on 07/22/2016) 90 tablet 1 Not Taking at Unknown time  . erythromycin ophthalmic ointment Place a 1/2 inch ribbon of ointment into the lower eyelid. (Patient not taking: Reported on 07/09/2016) 3.5 g 0 Not Taking at Unknown  time  . HYDROcodone-acetaminophen (NORCO/VICODIN) 5-325 MG tablet Take 1 tablet by mouth every 6 (six) hours as needed for severe pain. (Patient not taking: Reported on 07/09/2016) 10 tablet 0 Not Taking at Unknown time  . hydrOXYzine (ATARAX/VISTARIL) 25 MG tablet Take 1 tablet (25 mg total) by mouth every 6 (six) hours as needed for itching. (Patient not taking: Reported on 07/09/2016) 12 tablet 0 Not Taking at Unknown time  . imipramine (TOFRANIL) 25 MG tablet Take 1 tablet (25 mg total) by mouth at bedtime. (Patient not taking: Reported on 07/09/2016) 30 tablet 3 Not Taking at Unknown time  . naproxen (NAPROSYN) 500 MG tablet Take 1 tablet (500 mg total) by mouth 2 (two) times daily with a meal. 30 tablet 0   . omeprazole (PRILOSEC) 20 MG capsule Take 1 capsule (20  mg total) by mouth daily. (Patient not taking: Reported on 12/28/2016) 30 capsule 0 Not Taking at Unknown time  . ondansetron (ZOFRAN ODT) 4 MG disintegrating tablet Take 1 tablet (4 mg total) by mouth every 8 (eight) hours as needed for nausea or vomiting. 10 tablet 0   . oxymetazoline (AFRIN NASAL SPRAY) 0.05 % nasal spray Place 1 spray into both nostrils 2 (two) times daily. You may spray medicine and both nostrils twice daily for up to 3 days. Do not use for more than 3 days to prevent rebound rhinorrhea. (Patient not taking: Reported on 07/09/2016) 30 mL 0 Not Taking at Unknown time  . predniSONE (DELTASONE) 20 MG tablet Take 3 tablets (60 mg total) by mouth daily with breakfast. (Patient not taking: Reported on 10/20/2016) 15 tablet 0 Not Taking at Unknown time    Review of Systems  Constitutional: Negative for fever.  Gastrointestinal: Positive for abdominal pain, diarrhea, nausea and vomiting. Negative for constipation.  Musculoskeletal: Positive for back pain.   Physical Exam   Blood pressure 139/74, pulse 79, temperature 98.4 F (36.9 C), temperature source Oral, resp. rate 18, weight (!) 345 lb 4 oz (156.6 kg), last menstrual period 03/27/2017.  Physical Exam  Nursing note and vitals reviewed. Constitutional: She is oriented to person, place, and time. She appears well-developed and well-nourished. No distress.  HENT:  Head: Normocephalic and atraumatic.  Cardiovascular: Normal rate.   Respiratory: Effort normal.  GI: Soft. She exhibits no distension and no mass. There is no tenderness. There is no rebound and no guarding.  Musculoskeletal:       Lumbar back: She exhibits tenderness. She exhibits normal range of motion, no bony tenderness, no swelling, no edema, no pain and no spasm.  Neurological: She is alert and oriented to person, place, and time. She has normal strength. Coordination and gait normal.  + straight leg raise  Skin: Skin is warm and dry. No erythema.   Psychiatric: She has a normal mood and affect.   Results for orders placed or performed during the hospital encounter of 04/05/17 (from the past 24 hour(s))  Urinalysis, Routine w reflex microscopic     Status: None   Collection Time: 04/05/17 12:49 PM  Result Value Ref Range   Color, Urine YELLOW YELLOW   APPearance CLEAR CLEAR   Specific Gravity, Urine 1.013 1.005 - 1.030   pH 6.0 5.0 - 8.0   Glucose, UA NEGATIVE NEGATIVE mg/dL   Hgb urine dipstick NEGATIVE NEGATIVE   Bilirubin Urine NEGATIVE NEGATIVE   Ketones, ur NEGATIVE NEGATIVE mg/dL   Protein, ur NEGATIVE NEGATIVE mg/dL   Nitrite NEGATIVE NEGATIVE   Leukocytes, UA NEGATIVE NEGATIVE  CBC with Differential/Platelet     Status: None   Collection Time: 04/05/17  1:11 PM  Result Value Ref Range   WBC 9.6 4.0 - 10.5 K/uL   RBC 4.37 3.87 - 5.11 MIL/uL   Hemoglobin 13.8 12.0 - 15.0 g/dL   HCT 16.140.9 09.636.0 - 04.546.0 %   MCV 93.6 78.0 - 100.0 fL   MCH 31.6 26.0 - 34.0 pg   MCHC 33.7 30.0 - 36.0 g/dL   RDW 40.913.6 81.111.5 - 91.415.5 %   Platelets 207 150 - 400 K/uL   Neutrophils Relative % 53 %   Neutro Abs 5.0 1.7 - 7.7 K/uL   Lymphocytes Relative 39 %   Lymphs Abs 3.8 0.7 - 4.0 K/uL   Monocytes Relative 5 %   Monocytes Absolute 0.5 0.1 - 1.0 K/uL   Eosinophils Relative 3 %   Eosinophils Absolute 0.3 0.0 - 0.7 K/uL   Basophils Relative 0 %   Basophils Absolute 0.0 0.0 - 0.1 K/uL     MAU Course  Procedures None  MDM UA and CBC today No evidence of acute condition. Exam consistent with sciatic nerve pain. Patient has PCP, but wants to switch.  GI upset possible from antibiotics. Warning signs reviewed. No evidence of acute infection at this time. Patient is afebrile, WBCs normal and minimal pain at this time. No blood in the stool.   Assessment and Plan  A: Sciatic nerve pain GI upset from antibiotic use  P: Discharge home Rx for Percocet given to patient  Advised continues Naproxen or Ibuprofen for anti-inflammation Ice or  heat to the affected area Stretches for improvement in back pain given  Patient given contact for PCP with [facility] to establish care  Advised she may need PT or chiropractor for further management  Warning signs for worsening condition discussed Patient advised to follow-up with PCP of choice. Advised that she may also keep follow-up with [facility] for GYN annual exam Patient may return to MAU as needed or if her condition were to change or worsen   Vonzella NippleJulie Daylani Deblois 04/05/2017, 1:46 PM

## 2017-04-05 NOTE — Discharge Instructions (Signed)
Sciatica Sciatica is pain, numbness, weakness, or tingling along your sciatic nerve. The sciatic nerve starts in the lower back and goes down the back of each leg. Sciatica happens when this nerve is pinched or has pressure put on it. Sciatica usually goes away on its own or with treatment. Sometimes, sciatica may keep coming back (recur). Follow these instructions at home: Medicines  Take over-the-counter and prescription medicines only as told by your doctor.  Do not drive or use heavy machinery while taking prescription pain medicine. Managing pain  If directed, put ice on the affected area. ? Put ice in a plastic bag. ? Place a towel between your skin and the bag. ? Leave the ice on for 20 minutes, 2-3 times a day.  After icing, apply heat to the affected area before you exercise or as often as told by your doctor. Use the heat source that your doctor tells you to use, such as a moist heat pack or a heating pad. ? Place a towel between your skin and the heat source. ? Leave the heat on for 20-30 minutes. ? Remove the heat if your skin turns bright red. This is especially important if you are unable to feel pain, heat, or cold. You may have a greater risk of getting burned. Activity  Return to your normal activities as told by your doctor. Ask your doctor what activities are safe for you. ? Avoid activities that make your sciatica worse.  Take short rests during the day. Rest in a lying or standing position. This is usually better than sitting to rest. ? When you rest for a long time, do some physical activity or stretching between periods of rest. ? Avoid sitting for a long time without moving. Get up and move around at least one time each hour.  Exercise and stretch regularly, as told by your doctor.  Do not lift anything that is heavier than 10 lb (4.5 kg) while you have symptoms of sciatica. ? Avoid lifting heavy things even when you do not have symptoms. ? Avoid lifting heavy  things over and over.  When you lift objects, always lift in a way that is safe for your body. To do this, you should: ? Bend your knees. ? Keep the object close to your body. ? Avoid twisting. General instructions  Use good posture. ? Avoid leaning forward when you are sitting. ? Avoid hunching over when you are standing.  Stay at a healthy weight.  Wear comfortable shoes that support your feet. Avoid wearing high heels.  Avoid sleeping on a mattress that is too soft or too hard. You might have less pain if you sleep on a mattress that is firm enough to support your back.  Keep all follow-up visits as told by your doctor. This is important. Contact a doctor if:  You have pain that: ? Wakes you up when you are sleeping. ? Gets worse when you lie down. ? Is worse than the pain you have had in the past. ? Lasts longer than 4 weeks.  You lose weight for without trying. Get help right away if:  You cannot control when you pee (urinate) or poop (have a bowel movement).  You have weakness in any of these areas and it gets worse. ? Lower back. ? Lower belly (pelvis). ? Butt (buttocks). ? Legs.  You have redness or swelling of your back.  You have a burning feeling when you pee. This information is not intended to replace  advice given to you by your health care provider. Make sure you discuss any questions you have with your health care provider. Document Released: 06/01/2008 Document Revised: 01/29/2016 Document Reviewed: 05/02/2015 Elsevier Interactive Patient Education  2018 Elsevier Inc. BACK: Knee-Chest (Sciatica)    Sit on heels, separate knees for comfort and rest forehead on hands. Imagine breathing into the spaces of your back and rib cage. Hold position for ___ breaths. Repeat ___ times. Do ___ times per day.  Copyright  VHI. All rights reserved.  BACK: Garland Pose (Sciatica)    Crouched on balls of the feet, shift weight into the heels. Extend arms  forward. Separate knees for comfort. Hold position for ___ breaths. Repeat ___ times. Do ___ times per day.  Copyright  VHI. All rights reserved.  BACK: Child's Pose (Sciatica)    Sit in knee-chest position and reach arms forward. Separate knees for comfort. Hold position for ___ breaths. Repeat ___ times. Do ___ times per day.  Copyright  VHI. All rights reserved.   Primary Care Resources:  [facility]  [address] [place] [address] Ph [phone] Every 2nd Saturday 9am-12pm  [email or web address] FREE Services  - [facility]  [address] [place] Ph [phone]  [address] [place] Ph [phone]  [email or web address] $45 per visit/Walk-in only  - [facility]  [address] [place] [address] Ph [phone]  1st & 3rd Saturday of each month 9:30am-12:30pm [email or web address] Sliding fee scale/Call to make an appointment  - [facility]  [address] [place] Ph [phone]  Hours Mon-Fri 9am-7pm & Sat 9am-1pm [email or web address] Visits start at $45 per visit/Call to make an appointment  [facility]  [address] [place] [address] Ph [phone]  Hours Mon-Wed 8:30am-5pm & Thurs 8:30am-8pm $5 per visit/Call for an eligibility appointment

## 2017-04-12 ENCOUNTER — Emergency Department (HOSPITAL_COMMUNITY)
Admission: EM | Admit: 2017-04-12 | Discharge: 2017-04-12 | Disposition: A | Discharge status: Home / Self Care | Payer: 59 | Attending: Emergency Medicine | Admitting: Emergency Medicine

## 2017-04-12 ENCOUNTER — Encounter (HOSPITAL_COMMUNITY): Payer: Self-pay | Admitting: Emergency Medicine

## 2017-04-12 DIAGNOSIS — R1110 Vomiting, unspecified: Secondary | ICD-10-CM | POA: Diagnosis not present

## 2017-04-12 DIAGNOSIS — Z5321 Procedure and treatment not carried out due to patient leaving prior to being seen by health care provider: Secondary | ICD-10-CM | POA: Diagnosis not present

## 2017-04-12 NOTE — ED Triage Notes (Signed)
Pt reports having nausea, vomiting, and diarrhea that has been ongoing for the last week and [facility] same complaints. Pt reports worsening symptoms began at 0400 today.

## 2017-04-18 ENCOUNTER — Ambulatory Visit (INDEPENDENT_AMBULATORY_CARE_PROVIDER_SITE_OTHER): Payer: 59 | Admitting: Family

## 2017-04-18 ENCOUNTER — Encounter: Payer: Self-pay | Admitting: Family

## 2017-04-18 VITALS — BP 136/90 | HR 83 | Temp 98.6°F | Resp 18 | Ht 66.0 in | Wt 348.8 lb

## 2017-04-18 DIAGNOSIS — M545 Low back pain, unspecified: Secondary | ICD-10-CM | POA: Insufficient documentation

## 2017-04-18 DIAGNOSIS — Z23 Encounter for immunization: Secondary | ICD-10-CM

## 2017-04-18 DIAGNOSIS — G8929 Other chronic pain: Secondary | ICD-10-CM

## 2017-04-18 DIAGNOSIS — R112 Nausea with vomiting, unspecified: Secondary | ICD-10-CM

## 2017-04-18 DIAGNOSIS — M5441 Lumbago with sciatica, right side: Secondary | ICD-10-CM

## 2017-04-18 MED ORDER — TRAMADOL HCL 50 MG PO TABS: 50.0000 mg | ORAL_TABLET | Freq: Two times a day (BID) | ORAL | 0 refills | Status: DC | PRN

## 2017-04-18 MED ORDER — TIZANIDINE HCL 4 MG PO TABS: 4.0000 mg | ORAL_TABLET | Freq: Four times a day (QID) | ORAL | 0 refills | Status: DC | PRN

## 2017-04-18 MED ORDER — DICYCLOMINE HCL 10 MG PO CAPS: 10.0000 mg | ORAL_CAPSULE | Freq: Three times a day (TID) | ORAL | 0 refills | Status: DC

## 2017-04-18 NOTE — Assessment & Plan Note (Signed)
Chronic low back pain with previous noted degenerative disc disease with symptoms consistent with muscle spasm and sciatica with concern for disc related pathology. Treat conservatively with Zanaflex as needed for spasms, ice/moist heat, home exercise therapy and Tramadol as needed for breakthrough pain uncontrolled by naproxen. Previous ortho MD recommended MRI. Refer to physical therapy. If symptoms worsen or do not improve consider follow up with ortho or further imaging.

## 2017-04-18 NOTE — Progress Notes (Signed)
Subjective:    Patient ID: Tiffany Conley, female    DOB: [birth year], 36 y.o.   MRN: [id number]  Chief Complaint  Patient presents with  . Establish Care    x2 weeks, nausea, vomitting, and diarrhea, low back pain which she was told is sciatic pain    HPI:  Tiffany Conley is a 36 y.o. female who  has a past medical history of Headache; Sciatic nerve pain; and UTI (lower urinary tract infection). and presents today for an office visit to establish care.   1.) Abdominal issues - this is a new problem. Associated symptoms of nausea, vomiting, and diarrhea have been going on for approximately 2 weeks. Has been seen in the emergency department and at [facility] for similar complaints. On physical exam in the emergency department she was found to have present bowel sounds with suprapubic and right lower quadrant abdominal tenderness to palpation and no peritoneal sign. Pelvic exam was normal. Ultrasound of the pelvis showed trace free pelvic fluid with the exam otherwise normal. She did have a CT scan in February 2018 with renal stones measuring approximately 17 x 10 mm in the left lower pole. Does have decreased appetite secondary to the nausea and diarrhea. No fevers or blood in her stool. Does have cramping in her stomach that is unrelieved with a bowel movement. Frequency of bowel movements is about 5-6 times per day and endorses having gone 2-3 times already today. Stools are described as loose. Modifying factors include Pepto Bismol which helped a little.   2.) Back pain - Continues to experience the associated symptom of pain located in her right back that has been going on for a couple of years and worsened in the past 2-3 weeks when she fell. Severity is "extremely painful."  Pain is described as achy that is all day and sharp pain on occasions with radiculopathy down the right leg. She was noted to have degenerative disk disease. Has been seen by Theodis Shove which MRI was  requested but unable to obtained. Symptom severity wax and wane. She is able to complete her activities of daily living, but notes it has not hurt as consistently as it is now. Has taken Flexeril in the past which has helped but not able to function. She was also prescribed Percocet which is too strong as well. Does heat on an occasional basis. Has not done any physical therapy. No changes to bowel or bladder that she is aware of and denies saddle anesthesia.    Allergies  Allergen Reactions  . Citrus Itching and Rash  . Ibuprofen Nausea Only  . Vicodin [Hydrocodone-Acetaminophen] Nausea And Vomiting      Outpatient Medications Prior to Visit  Medication Sig Dispense Refill  . albuterol (PROVENTIL HFA;VENTOLIN HFA) 108 (90 Base) MCG/ACT inhaler Inhale 2 puffs into the lungs every 4 (four) hours as needed for wheezing or shortness of breath. 1 Inhaler 2  . naproxen (NAPROSYN) 500 MG tablet Take 1 tablet (500 mg total) by mouth 2 (two) times daily with a meal. 30 tablet 0  . ondansetron (ZOFRAN ODT) 4 MG disintegrating tablet Take 1 tablet (4 mg total) by mouth every 8 (eight) hours as needed for nausea or vomiting. 10 tablet 0  . oxyCODONE-acetaminophen (PERCOCET/ROXICET) 5-325 MG tablet Take 1-2 tablets by mouth every 6 (six) hours as needed for severe pain. 10 tablet 0   No facility-administered medications prior to visit.      Past Medical History:  Diagnosis Date  .  Headache   . Sciatic nerve pain   . UTI (lower urinary tract infection)       Past Surgical History:  Procedure Laterality Date  . APPENDECTOMY    . CHOLECYSTECTOMY    . ENDOMETRIAL ABLATION    . KNEE ARTHROSCOPY    . SHOULDER SURGERY Left   . TONSILLECTOMY        Family History  Problem Relation Age of Onset  . Colon cancer Maternal Grandmother   . Healthy Mother   . Congestive Heart Failure Father       Social History   Social History  . Marital status: Single    Spouse name: N/A  . Number of  children: 0  . Years of education: 2   Occupational History  . Not on file.   Social History Main Topics  . Smoking status: Current Every Day Smoker    Packs/day: 0.75    Years: 10.00    Types: Cigarettes  . Smokeless tobacco: Never Used  . Alcohol use No  . Drug use: Yes    Frequency: 7.0 times per week    Types: Marijuana  . Sexual activity: Not Currently    Birth control/ protection: None   Other Topics Concern  . Not on file   Social History Narrative   Fun/Hobby: Read   Denies abuse and feels safe at home.       Review of Systems  Constitutional: Negative for chills and fever.  Respiratory: Negative for chest tightness and shortness of breath.   Cardiovascular: Negative for chest pain, palpitations and leg swelling.  Gastrointestinal: Positive for abdominal pain, diarrhea, nausea and vomiting. Negative for abdominal distention, blood in stool and constipation.  Genitourinary: Negative for dyspareunia, frequency and urgency.  Neurological: Negative for dizziness, weakness and headaches.       Objective:    BP 136/90 (BP Location: Left Arm, Patient Position: Sitting, Cuff Size: Large)   Pulse 83   Temp 98.6 F (37 C) (Oral)   Resp 18   Ht 5\' 6"  (1.676 m)   Wt (!) 348 lb 12.8 oz (158.2 kg)   LMP 03/27/2017 (Exact Date)   SpO2 97%   BMI 56.30 kg/m  Nursing note and vital signs reviewed.  Physical Exam  Constitutional: She is oriented to person, place, and time. She appears well-developed and well-nourished. No distress.  Cardiovascular: Normal rate, regular rhythm, normal heart sounds and intact distal pulses.  Exam reveals no gallop and no friction rub.   No murmur heard. Pulmonary/Chest: Effort normal and breath sounds normal. No respiratory distress. She has no wheezes. She has no rales. She exhibits no tenderness.  Abdominal: Normal appearance. She exhibits no distension and no mass. There is no hepatosplenomegaly. There is tenderness in the right  upper quadrant, right lower quadrant, epigastric area and suprapubic area. There is no rigidity, no rebound, no guarding, no tenderness at McBurney's point and negative Murphy's sign.  Musculoskeletal:  Low back - excessive lordotic curve. No obvious deformity, discoloration, or edema. Tenderness elicited over right lumbar paraspinal musculature and piriformis. Flexion restricted secondary to pain. Extension and all other motions are normal. Negative straight leg raise and unable to complete Faber's. Distal pulses and sensation are intact and appropriate.  Neurological: She is alert and oriented to person, place, and time.  Skin: Skin is warm and dry.  Psychiatric: She has a normal mood and affect. Her behavior is normal. Judgment and thought content normal.  Assessment & Plan:   Problem List Items Addressed This Visit      Digestive   Nausea with vomiting    Nausea, vomiting, and diarrhea of undetermined origin although does not appear to be infectious in nature. Question irritable bowel syndrome given cramping and continued diarrhea. Unable to try Viberzi secondary to gall bladder removal. Start dicyclomine. Encouraged aggressive weight loss and possible PPI for reflux. Continue current dosage of ondansetron as needed for nausea. If symptoms do not improve will refer to GI for further evaluation.         Other   Low back pain    Chronic low back pain with previous noted degenerative disc disease with symptoms consistent with muscle spasm and sciatica with concern for disc related pathology. Treat conservatively with Zanaflex as needed for spasms, ice/moist heat, home exercise therapy and Tramadol as needed for breakthrough pain uncontrolled by naproxen. Previous ortho MD recommended MRI. Refer to physical therapy. If symptoms worsen or do not improve consider follow up with ortho or further imaging.       Relevant Medications   tiZANidine (ZANAFLEX) 4 MG tablet   traMADol (ULTRAM)  50 MG tablet   Other Relevant Orders   Ambulatory referral to Physical Therapy    Other Visit Diagnoses    Need for Tdap vaccination    -  Primary   Relevant Orders   Tdap vaccine greater than or equal to 7yo IM (Completed)       I have discontinued Ms. Clermont's oxyCODONE-acetaminophen. I am also having her start on tiZANidine, traMADol, and dicyclomine. Additionally, I am having her maintain her albuterol, naproxen, and ondansetron.   Meds ordered this encounter  Medications  . tiZANidine (ZANAFLEX) 4 MG tablet    Sig: Take 1 tablet (4 mg total) by mouth every 6 (six) hours as needed for muscle spasms.    Dispense:  30 tablet    Refill:  0    Order Specific Question:   Supervising Provider    Answer:   Hillard DankerRAWFORD, ELIZABETH A [[id number]]  . traMADol (ULTRAM) 50 MG tablet    Sig: Take 1 tablet (50 mg total) by mouth every 12 (twelve) hours as needed.    Dispense:  60 tablet    Refill:  0    Order Specific Question:   Supervising Provider    Answer:   Hillard DankerRAWFORD, ELIZABETH A [[id number]]  . dicyclomine (BENTYL) 10 MG capsule    Sig: Take 1 capsule (10 mg total) by mouth 4 (four) times daily -  before meals and at bedtime.    Dispense:  120 capsule    Refill:  0    Order Specific Question:   Supervising Provider    Answer:   Hillard DankerRAWFORD, ELIZABETH A [[id number]]     Follow-up: Return in about 1 month (around 05/19/2017), or if symptoms worsen or fail to improve.  Jeanine Luzalone, Gregory, FNP

## 2017-04-18 NOTE — Assessment & Plan Note (Signed)
Nausea, vomiting, and diarrhea of undetermined origin although does not appear to be infectious in nature. Question irritable bowel syndrome given cramping and continued diarrhea. Unable to try Viberzi secondary to gall bladder removal. Start dicyclomine. Encouraged aggressive weight loss and possible PPI for reflux. Continue current dosage of ondansetron as needed for nausea. If symptoms do not improve will refer to GI for further evaluation.

## 2017-04-18 NOTE — Patient Instructions (Signed)
Thank you for choosing [facility].  SUMMARY AND INSTRUCTIONS:  Ice/moist heat 20 minutes every other hour and as needed following activity.  Continue naproxen and start tramadol as needed for breakthrough pain.  Stretches and exercises multiple times throughout the day.  Zanaflex as needed for muscle spasms. Be cautious as this can make you sleepy or drowsy.  A referral to physical therapy has been sent.  Start the dicyclomine as needed for spasms.  For diarrhea try Imodium. Increase fiber intake.  Medication:  Your prescription(s) have been submitted to your pharmacy or been printed and provided for you. Please take as directed and contact our office if you believe you are having problem(s) with the medication(s) or have any questions.  Referrals:  Referrals have been made during this visit. You should expect to hear back from our schedulers in about 7-10 days in regards to establishing an appointment with the specialists we discussed.   Follow up:  If your symptoms worsen or fail to improve, please contact our office for further instruction, or in case of emergency go directly to the emergency room at the closest medical facility.   Irritable Bowel Syndrome, Adult Irritable bowel syndrome (IBS) is not one specific disease. It is a group of symptoms that affects the organs responsible for digestion (gastrointestinal or GI tract). To regulate how your GI tract works, your body sends signals back and forth between your intestines and your brain. If you have IBS, there may be a problem with these signals. As a result, your GI tract does not function normally. Your intestines may become more sensitive and overreact to certain things. This is especially true when you eat certain foods or when you are under stress. There are four types of IBS. These may be determined based on the consistency of your stool:  IBS with diarrhea.  IBS with constipation.  Mixed  IBS.  Unsubtyped IBS.  It is important to know which type of IBS you have. Some treatments are more likely to be helpful for certain types of IBS. What are the causes? The exact cause of IBS is not known. What increases the risk? You may have a higher risk of IBS if:  You are a woman.  You are younger than 37 years old.  You have a family history of IBS.  You have mental health problems.  You have had bacterial infection of your GI tract.  What are the signs or symptoms? Symptoms of IBS vary from person to person. The main symptom is abdominal pain or discomfort. Additional symptoms usually include one or more of the following:  Diarrhea, constipation, or both.  Abdominal swelling or bloating.  Feeling full or sick after eating a small or regular-size meal.  Frequent gas.  Mucus in the stool.  A feeling of having more stool left after a bowel movement.  Symptoms tend to come and go. They may be associated with stress, psychiatric conditions, or nothing at all. How is this diagnosed? There is no specific test to diagnose IBS. Your health care provider will make a diagnosis based on a physical exam, medical history, and your symptoms. You may have other tests to rule out other conditions that may be causing your symptoms. These may include:  Blood tests.  X-rays.  CT scan.  Endoscopy and colonoscopy. This is a test in which your GI tract is viewed with a long, thin, flexible tube.  How is this treated? There is no cure for IBS, but treatment can  help relieve symptoms. IBS treatment often includes:  Changes to your diet, such as: ? Eating more fiber. ? Avoiding foods that cause symptoms. ? Drinking more water. ? Eating regular, medium-sized portioned meals.  Medicines. These may include: ? Fiber supplements if you have constipation. ? Medicine to control diarrhea (antidiarrheal medicines). ? Medicine to help control muscle spasms in your GI tract (antispasmodic  medicines). ? Medicines to help with any mental health issues, such as antidepressants or tranquilizers.  Therapy. ? Talk therapy may help with anxiety, depression, or other mental health issues that can make IBS symptoms worse.  Stress reduction. ? Managing your stress can help keep symptoms under control.  Follow these instructions at home:  Take medicines only as directed by your health care provider.  Eat a healthy diet. ? Avoid foods and drinks with added sugar. ? Include more whole grains, fruits, and vegetables gradually into your diet. This may be especially helpful if you have IBS with constipation. ? Avoid any foods and drinks that make your symptoms worse. These may include dairy products and caffeinated or carbonated drinks. ? Do not eat large meals. ? Drink enough fluid to keep your urine clear or pale yellow.  Exercise regularly. Ask your health care provider for recommendations of good activities for you.  Keep all follow-up visits as directed by your health care provider. This is important. Contact a health care provider if:  You have constant pain.  You have trouble or pain with swallowing.  You have worsening diarrhea. Get help right away if:  You have severe and worsening abdominal pain.  You have diarrhea and: ? You have a rash, stiff neck, or severe headache. ? You are irritable, sleepy, or difficult to awaken. ? You are weak, dizzy, or extremely thirsty.  You have bright red blood in your stool or you have black tarry stools.  You have unusual abdominal swelling that is painful.  You vomit continuously.  You vomit blood (hematemesis).  You have both abdominal pain and a fever. This information is not intended to replace advice given to you by your health care provider. Make sure you discuss any questions you have with your health care provider. Document Released: 08/23/2005 Document Revised: 01/23/2016 Document Reviewed: 05/10/2014 Elsevier  Interactive Patient Education  2018 [employer].

## 2017-04-25 ENCOUNTER — Encounter: Payer: Self-pay | Admitting: Obstetrics and Gynecology

## 2017-04-26 ENCOUNTER — Encounter: Payer: Self-pay | Admitting: Obstetrics and Gynecology

## 2017-04-26 NOTE — Progress Notes (Signed)
Patient did not keep GYN appointment for 04/25/2017.  Cornelia Copa MD Attending [facility] [employer])

## 2017-05-16 ENCOUNTER — Ambulatory Visit: Payer: 59 | Admitting: Family

## 2017-05-30 ENCOUNTER — Ambulatory Visit: Payer: 59 | Admitting: Family

## 2017-06-01 ENCOUNTER — Other Ambulatory Visit: Payer: 59

## 2017-06-01 ENCOUNTER — Encounter: Payer: Self-pay | Admitting: Family

## 2017-06-01 ENCOUNTER — Ambulatory Visit (INDEPENDENT_AMBULATORY_CARE_PROVIDER_SITE_OTHER): Payer: 59 | Admitting: Family

## 2017-06-01 VITALS — BP 118/84 | HR 81 | Temp 99.7°F | Resp 16 | Ht 66.0 in | Wt 343.8 lb

## 2017-06-01 DIAGNOSIS — R197 Diarrhea, unspecified: Secondary | ICD-10-CM | POA: Diagnosis not present

## 2017-06-01 DIAGNOSIS — S83282D Other tear of lateral meniscus, current injury, left knee, subsequent encounter: Secondary | ICD-10-CM

## 2017-06-01 MED ORDER — OMEPRAZOLE 40 MG PO CPDR: 40.0000 mg | DELAYED_RELEASE_CAPSULE | Freq: Every day | ORAL | 3 refills | Status: DC

## 2017-06-01 MED ORDER — DIPHENOXYLATE-ATROPINE 2.5-0.025 MG PO TABS: 1.0000 | ORAL_TABLET | Freq: Three times a day (TID) | ORAL | 0 refills | Status: DC | PRN

## 2017-06-01 MED ORDER — TRAMADOL HCL 50 MG PO TABS: 50.0000 mg | ORAL_TABLET | Freq: Two times a day (BID) | ORAL | 0 refills | Status: DC | PRN

## 2017-06-01 NOTE — Patient Instructions (Signed)
Thank you for choosing [facility].  SUMMARY AND INSTRUCTIONS:  Referral has been placed for orthopedics and for gastroenterology.  Lomotil as needed for diarrhea.   We will check your stool for infection.   Omeprazole for stomach acid.   Ice x 20 minutes every other hour. Continue with elevation and consider compressin / knee sleeve. Continue naproxen.   Tramadol as needed for pain not controlled with naproxen.  Medication:  Your prescription(s) have been submitted to your pharmacy or been printed and provided for you. Please take as directed and contact our office if you believe you are having problem(s) with the medication(s) or have any questions.  Follow up:  If your symptoms worsen or fail to improve, please contact our office for further instruction, or in case of emergency go directly to the emergency room at the closest medical facility.

## 2017-06-01 NOTE — Assessment & Plan Note (Signed)
Continues to experience diarrhea with concern for IBS or IBD. Obtain stool culture to rule out infection although likely function. Start Lomitl. Refer to gastroenterology as she may require a colonoscopy. Follow up if symptoms worsen or do not improve pending gastroenterology referral.

## 2017-06-01 NOTE — Assessment & Plan Note (Signed)
Symptoms and exam are consistent with lateral meniscus tear that is most likely chronic. Continue conservative treatment with ice, naproxen and compression. Recommend MRI and physical therapy will defer to physical therapy.

## 2017-06-01 NOTE — Progress Notes (Signed)
Subjective:    Patient ID: Tiffany Conley, female    DOB: November 11, 1980, 36 y.o.   MRN: [id number]  Chief Complaint  Patient presents with  . Leg Pain    x2 weeks has had left leg swelling and having pain, also follow up for stomach issues from last visit    HPI:  Tiffany Conley is a 36 y.o. female who  has a past medical history of Headache; Sciatic nerve pain; and UTI (lower urinary tract infection). and presents today for a follow up office visit.  1.) Left leg swelling - This is a new problem. Associated symptom of pain and swelling located in her left leg primarily in her left knee has been going on for about 2 weeks. No trauma or injury. Pain is described as a mixture of throbbing, aching, and sharp at times. Has been seen by orthopedics in the past with the recommendation for surgery which she was unable to afford. Has had a cortisone injection in the past which helped for about a month. Does have occasional popping. Modifying factors include ice/heat and keeping it elevated. Course of the symptoms are staying about the same.   2.) Stomach issues -This is a new problem.  Previously evaluated and believed to be IBS. Started on dicyclomine and reports taking the medications as prescribed and denies adverse side effects. Symptoms have improved a little with decreased nausea and has had no vomiting, but continues to have diarrhea. Frequency of the bowel movements are 3-4 per day up to 5 per day.   Allergies  Allergen Reactions  . Citrus Itching and Rash  . Ibuprofen Nausea Only  . Vicodin [Hydrocodone-Acetaminophen] Nausea And Vomiting      Outpatient Medications Prior to Visit  Medication Sig Dispense Refill  . albuterol (PROVENTIL HFA;VENTOLIN HFA) 108 (90 Base) MCG/ACT inhaler Inhale 2 puffs into the lungs every 4 (four) hours as needed for wheezing or shortness of breath. 1 Inhaler 2  . dicyclomine (BENTYL) 10 MG capsule Take 1 capsule (10 mg total) by mouth 4 (four) times  daily -  before meals and at bedtime. 120 capsule 0  . naproxen (NAPROSYN) 500 MG tablet Take 1 tablet (500 mg total) by mouth 2 (two) times daily with a meal. 30 tablet 0  . ondansetron (ZOFRAN ODT) 4 MG disintegrating tablet Take 1 tablet (4 mg total) by mouth every 8 (eight) hours as needed for nausea or vomiting. 10 tablet 0  . tiZANidine (ZANAFLEX) 4 MG tablet Take 1 tablet (4 mg total) by mouth every 6 (six) hours as needed for muscle spasms. 30 tablet 0  . traMADol (ULTRAM) 50 MG tablet Take 1 tablet (50 mg total) by mouth every 12 (twelve) hours as needed. 60 tablet 0   No facility-administered medications prior to visit.     Past Medical History:  Diagnosis Date  . Headache   . Sciatic nerve pain   . UTI (lower urinary tract infection)     Review of Systems  Constitutional: Negative for chills and fever.  Cardiovascular: Negative for chest pain, palpitations and leg swelling.  Gastrointestinal: Positive for diarrhea and nausea. Negative for abdominal pain, blood in stool, constipation and vomiting.  Musculoskeletal:       Positive for left knee pain.       Objective:    BP 118/84 (BP Location: Left Arm, Patient Position: Sitting, Cuff Size: Large)   Pulse 81   Temp 99.7 F (37.6 C) (Oral)   Resp 16  Ht  (1.676 m)   Wt (!) 343 lb 12.8 oz (155.9 kg)   SpO2 97%   BMI 55.49 kg/m  Nursing note and vital signs reviewed.  Physical Exam  Constitutional: She is oriented to person, place, and time. She appears well-developed and well-nourished. No distress.  Obese  Cardiovascular: Normal rate, regular rhythm, normal heart sounds and intact distal pulses.   Pulmonary/Chest: Effort normal and breath sounds normal.  Abdominal: Soft. Bowel sounds are normal. She exhibits no distension and no mass. There is no tenderness. There is no rebound and no guarding.  Musculoskeletal:  Left knee- No obvious deformity or discoloration with moderate edema. There is tenderness of the  medial and lateral joint lines with no crepitus or deformity. Range of motion is normal. Strength is normal. Ligamentous testing is negative. Positive McMurray's. Distal pulses and sensation are intact and appropriate.   Neurological: She is alert and oriented to person, place, and time.  Skin: Skin is warm and dry.  Psychiatric: She has a normal mood and affect. Her behavior is normal. Judgment and thought content normal.       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 Continue conservative treatment with ice, naproxen and compression. Recommend MRI and physical therapy will defer to physical therapy.       Relevant Orders   AMB referral to orthopedics     Other   Diarrhea    Continues to experience diarrhea with concern for IBS or IBD. Obtain stool culture to rule out infection although likely function. Start Lomitl. Refer to gastroenterology as she may require a colonoscopy. Follow up if symptoms worsen or do not improve pending gastroenterology referral.       Relevant Orders   Ambulatory referral to Gastroenterology   Gastrointestinal Panel by PCR , Stool       I am having Ms. Clinton Sawyer start on diphenoxylate-atropine and omeprazole. I am also having her maintain her albuterol, naproxen, ondansetron, tiZANidine, dicyclomine, and traMADol.   Meds ordered this encounter  Medications  . diphenoxylate-atropine (LOMOTIL) 2.5-0.025 MG tablet    Sig: Take 1 tablet by mouth 3 (three) times daily as needed for diarrhea or loose stools.    Dispense:  30 tablet    Refill:  0    Order Specific Question:   Supervising Provider    Answer:   Hillard Danker A [[id number]]  . omeprazole (PRILOSEC) 40 MG capsule    Sig: Take 1 capsule (40 mg total) by mouth daily.    Dispense:  30 capsule    Refill:  3     Order Specific Question:   Supervising Provider    Answer:   Hillard Danker A [[id number]]  . traMADol (ULTRAM) 50 MG tablet    Sig: Take 1 tablet (50 mg total) by mouth every 12 (twelve) hours as needed.    Dispense:  60 tablet    Refill:  0    Order Specific Question:   Supervising Provider    Answer:   Hillard Danker A [[id number]]     Follow-up: Return in about 2 months (around 08/01/2017), or if symptoms worsen or fail to improve.  Jeanine Luz, FNP

## 2017-06-02 ENCOUNTER — Encounter: Payer: Self-pay | Admitting: Gastroenterology

## 2017-06-03 ENCOUNTER — Telehealth: Payer: Self-pay | Admitting: *Deleted

## 2017-06-03 MED ORDER — DIPHENOXYLATE-ATROPINE 2.5-0.025 MG PO TABS: 1.0000 | ORAL_TABLET | Freq: Three times a day (TID) | ORAL | 0 refills | Status: DC | PRN

## 2017-06-03 NOTE — Telephone Encounter (Signed)
FYI.Marland KitchenMarland KitchenReceived call from pharmacist Dellie Burns) stating [facility] has a new policy the maximum limited they can give on med " Lomotil" is a 7 day supply. Received script for 1 three times a day # 30. Needing to get quantity change to # 21 which would be for 7 day. Gave verbal to change, updated med list.../lmb

## 2017-06-03 NOTE — Telephone Encounter (Signed)
Noted  

## 2017-06-06 ENCOUNTER — Other Ambulatory Visit: Payer: 59

## 2017-06-06 DIAGNOSIS — R197 Diarrhea, unspecified: Secondary | ICD-10-CM

## 2017-06-09 LAB — GASTROINTESTINAL PATHOGEN PANEL PCR
C. DIFFICILE TOX A/B, PCR: NOT DETECTED
Campylobacter, PCR: NOT DETECTED
Cryptosporidium, PCR: NOT DETECTED
E COLI (STEC) STX1/STX2, PCR: NOT DETECTED
E coli (ETEC) LT/ST PCR: NOT DETECTED
E coli 0157, PCR: NOT DETECTED
Giardia lamblia, PCR: NOT DETECTED
Norovirus, PCR: NOT DETECTED
ROTAVIRUS, PCR: NOT DETECTED
SALMONELLA, PCR: NOT DETECTED
SHIGELLA, PCR: NOT DETECTED

## 2017-06-13 ENCOUNTER — Encounter (INDEPENDENT_AMBULATORY_CARE_PROVIDER_SITE_OTHER): Payer: Self-pay | Admitting: Orthopaedic Surgery

## 2017-06-13 ENCOUNTER — Ambulatory Visit (INDEPENDENT_AMBULATORY_CARE_PROVIDER_SITE_OTHER): Payer: 59 | Admitting: Orthopaedic Surgery

## 2017-06-13 ENCOUNTER — Ambulatory Visit (INDEPENDENT_AMBULATORY_CARE_PROVIDER_SITE_OTHER): Payer: 59

## 2017-06-13 DIAGNOSIS — G8929 Other chronic pain: Secondary | ICD-10-CM | POA: Diagnosis not present

## 2017-06-13 DIAGNOSIS — M25562 Pain in left knee: Secondary | ICD-10-CM

## 2017-06-13 NOTE — Progress Notes (Signed)
Office Visit Note   Patient: Tiffany Conley           Date of Birth: 1981/07/14           MRN: [id number] Visit Date: 06/13/2017              Requested by: Veryl Speak, FNP [address] [place], [place] [address] PCP: Veryl Speak, FNP   Assessment & Plan: Visit Diagnoses:  1. 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 Patient has had previous MRI which showed meniscal tears. Recommend repeat MRI to evaluate these tears for possible surgery.  Follow-up after the MRI.  Follow-Up Instructions: Return in about 10 days (around 06/23/2017).   Orders:  Orders Placed This Encounter  Procedures  . XR KNEE 3 VIEW LEFT   No orders of the defined types were placed in this encounter.     Procedures: No procedures performed   Clinical Data: No additional findings.   Subjective: Chief Complaint  Patient presents with  . Left Knee - Pain    Patient is a 36 year old female comes in with chronic left knee pain for several years. She previously had an MRI of her left knee which showed a lateral meniscus tear. She elected not to undergo surgery at that time. She is continuing to have issues with her left knee. She has been seeing her primary care doctor recently regarding this and has been taking oral NSAIDs. Denies any numbness and tingling. She is currently trying to lose weight.    Review of Systems  Constitutional: Negative.   HENT: Negative.   Eyes: Negative.   Respiratory: Negative.   Cardiovascular: Negative.   Endocrine: Negative.   Musculoskeletal: Negative.   Neurological: Negative.   Hematological: Negative.   Psychiatric/Behavioral: Negative.   All other systems reviewed and are negative.    Objective: Vital Signs: There were no vitals taken for this visit.  Physical Exam  Constitutional: She is oriented  to person, place, and time. She appears well-developed and well-nourished.  HENT:  Head: Normocephalic and atraumatic.  Eyes: EOM are normal.  Neck: Neck supple.  Pulmonary/Chest: Effort normal.  Abdominal: Soft.  Neurological: She is alert and oriented to person, place, and time.  Skin: Skin is warm. Capillary refill takes less than 2 seconds.  Psychiatric: She has a normal mood and affect. Her behavior is normal. Judgment and thought content normal.  Nursing note and vitals reviewed.   Ortho Exam Left knee exam shows a trace effusion. Collaterals and cruciates are grossly intact. Normal range of motion. Patella tracking normal. Mild medial and lateral joint line tenderness Specialty Comments:  No specialty comments available.  Imaging: No results found.   PMFS History: Patient Active Problem List   Diagnosis Date Noted  . Diarrhea 06/01/2017  . Nausea with vomiting 04/18/2017  . Low back pain 04/18/2017  . Occipital neuralgia of right side 04/27/2016  . Common migraine without intractability 04/27/2016  . Neck pain 04/27/2016  . 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 INTERNAL DERANGEMENT, RIGHT KNEE 09/08/2010  . Lateral meniscus tear 09/08/2010   Past Medical History:  Diagnosis Date  . Headache   . Sciatic nerve pain   . UTI (lower urinary tract infection)     Family History  Problem Relation Age of Onset  . Colon cancer Maternal Grandmother   .  Healthy Mother   . Congestive Heart Failure Father     Past Surgical History:  Procedure Laterality Date  . APPENDECTOMY    . CHOLECYSTECTOMY    . ENDOMETRIAL ABLATION    . KNEE ARTHROSCOPY    . SHOULDER SURGERY Left   . TONSILLECTOMY     Social History   Occupational History  . Not on file.   Social History Main Topics  . Smoking status: Current Every Day Smoker    Packs/day: 0.75    Years: 10.00    Types: Cigarettes  . Smokeless tobacco: Never Used  . Alcohol use No  . Drug  use: Yes    Frequency: 7.0 times per week    Types: Marijuana  . Sexual activity: Not Currently    Birth control/ protection: None

## 2017-06-13 NOTE — Addendum Note (Signed)
Addended by: Albertina Parr on: 06/13/2017 11:18 AM   Modules accepted: Orders

## 2017-06-17 ENCOUNTER — Encounter: Payer: Self-pay | Admitting: Family

## 2017-08-01 ENCOUNTER — Encounter: Payer: Self-pay | Admitting: Gastroenterology

## 2017-08-01 ENCOUNTER — Encounter (INDEPENDENT_AMBULATORY_CARE_PROVIDER_SITE_OTHER): Payer: Self-pay

## 2017-08-01 ENCOUNTER — Ambulatory Visit (INDEPENDENT_AMBULATORY_CARE_PROVIDER_SITE_OTHER): Payer: 59 | Admitting: Gastroenterology

## 2017-08-01 VITALS — BP 118/74 | HR 68 | Ht 66.5 in | Wt 345.0 lb

## 2017-08-01 DIAGNOSIS — R197 Diarrhea, unspecified: Secondary | ICD-10-CM

## 2017-08-01 MED ORDER — CHOLESTYRAMINE LIGHT 4 G PO PACK: 4.0000 g | PACK | Freq: Every day | ORAL | 11 refills | Status: DC

## 2017-08-01 NOTE — Progress Notes (Signed)
HPI: This is a very pleasant 36 year old woman who was referred to me by Veryl Speakalone, Gregory D, FNP  to evaluate chronic diarrhea.    Chief complaint is chronic diarrhea  Always frequent stools.  Seems more frequent that even her usual.  + urgency with every meal. No bleeding.  She will usually eat, and no matter what it is she will have abdominal cramping and then very soon after that have loose stools.  The loose stools have been going on ever since her gallbladder was removed.  She was told the loose stools were not related to her cholecystectomy.  She has had no fevers or chills.  She does not see blood in her stool or dark colored stools.  Her GB had colon cancer.  Normal 3-5 BMs, always loose or at least very soft.  Has been intentionally losing weight; stopped drinking sodas (lost 30 pounds in less than a year).   Old Data Reviewed:  06/2017 GI path panel negative 03/2017 cbc, cmet normal   Review of systems: Pertinent positive and negative review of systems were noted in the above HPI section. All other review negative.   Past Medical History:  Diagnosis Date  . Headache   . Sciatic nerve pain   . UTI (lower urinary tract infection)   morbid obesity BMI >50  Past Surgical History:  Procedure Laterality Date  . APPENDECTOMY    . CHOLECYSTECTOMY    . ENDOMETRIAL ABLATION    . KNEE ARTHROSCOPY    . SHOULDER SURGERY Left   . TONSILLECTOMY      Current Outpatient Medications  Medication Sig Dispense Refill  . albuterol (PROVENTIL HFA;VENTOLIN HFA) 108 (90 Base) MCG/ACT inhaler Inhale 2 puffs into the lungs every 4 (four) hours as needed for wheezing or shortness of breath. 1 Inhaler 2  . dicyclomine (BENTYL) 10 MG capsule Take 1 capsule (10 mg total) by mouth 4 (four) times daily -  before meals and at bedtime. 120 capsule 0  . diphenoxylate-atropine (LOMOTIL) 2.5-0.025 MG tablet Take 1 tablet by mouth 3 (three) times daily as needed for diarrhea or loose stools. 21  tablet 0  . naproxen (NAPROSYN) 500 MG tablet Take 1 tablet (500 mg total) by mouth 2 (two) times daily with a meal. 30 tablet 0  . omeprazole (PRILOSEC) 40 MG capsule Take 1 capsule (40 mg total) by mouth daily. 30 capsule 3  . ondansetron (ZOFRAN ODT) 4 MG disintegrating tablet Take 1 tablet (4 mg total) by mouth every 8 (eight) hours as needed for nausea or vomiting. 10 tablet 0  . tiZANidine (ZANAFLEX) 4 MG tablet Take 1 tablet (4 mg total) by mouth every 6 (six) hours as needed for muscle spasms. 30 tablet 0  . traMADol (ULTRAM) 50 MG tablet Take 1 tablet (50 mg total) by mouth every 12 (twelve) hours as needed. 60 tablet 0   No current facility-administered medications for this visit.     Allergies as of 08/01/2017 - Review Complete 08/01/2017  Allergen Reaction Noted  . Citrus Itching and Rash 09/20/2011  . Ibuprofen Nausea Only 09/08/2010  . Vicodin [hydrocodone-acetaminophen] Nausea And Vomiting 09/27/2013    Family History  Problem Relation Age of Onset  . Colon cancer Maternal Grandmother   . Healthy Mother   . Congestive Heart Failure Father     Social History   Socioeconomic History  . Marital status: Single    Spouse name: Not on file  . Number of children: 0  . Years of  education: 9013  . Highest education level: Not on file  Social Needs  . Financial resource strain: Not on file  . Food insecurity - worry: Not on file  . Food insecurity - inability: Not on file  . Transportation needs - medical: Not on file  . Transportation needs - non-medical: Not on file  Occupational History  . Not on file  Tobacco Use  . Smoking status: Current Every Day Smoker    Packs/day: 0.75    Years: 10.00    Pack years: 7.50    Types: Cigarettes  . Smokeless tobacco: Never Used  Substance and Sexual Activity  . Alcohol use: No  . Drug use: Yes    Frequency: 7.0 times per week    Types: Marijuana  . Sexual activity: Not Currently    Birth control/protection: None  Other  Topics Concern  . Not on file  Social History Narrative   Fun/Hobby: Read   Denies abuse and feels safe at home.      Physical Exam: BP 118/74   Pulse 68   Ht 5' 6.5" (1.689 m)   Wt (!) 345 lb (156.5 kg)   BMI 54.85 kg/m  Constitutional: generally well-appearing Psychiatric: alert and oriented x3 Eyes: extraocular movements intact Mouth: oral pharynx moist, no lesions Neck: supple no lymphadenopathy Cardiovascular: heart regular rate and rhythm Lungs: clear to auscultation bilaterally Abdomen: soft, nontender, nondistended, no obvious ascites, no peritoneal signs, normal bowel sounds Extremities: no lower extremity edema bilaterally Skin: no lesions on visible extremities   Assessment and plan: 36 y.o. female with chronic diarrhea, occasional cramping  I think her chronic loose stools are related to her gallbladder resection many years ago.  This is not an uncommon side effect of gallbladder removal.  Cholestyramine powder is usually very effective and I am going to go ahead and start treatment with 4 g powder once daily.  She will call to report on her response in 3-4 weeks.  Would titrate upward if needed.  If this is not helpful then she may need further testing such as a colonoscopy.  I see no reason for any further blood tests or imaging studies at this point  Please see the "Patient Instructions" section for addition details about the plan.   Rob Buntinganiel Adriel Kessen, MD  Gastroenterology 08/01/2017, 10:33 AM  Cc: Veryl Speakalone, Gregory D, FNP

## 2017-08-01 NOTE — Patient Instructions (Addendum)
One of your biggest health concerns is your smoking.  This increases your risk for most cancers and serious cardiovascular diseases such as strokes, heart attacks.  You should try your best to stop.  If you need assistance, please contact your PCP or Smoking Cessation Class at [facility] [phone]) or [facility] (1-800-QUIT-NOW).  #2 side effect of tramadol is nausea, consider limiting this pain medicine as best as possible.  New prescription for cholestyramine powder 4gm, once daily, disp 1 month with 11 refills.   Call in 3-4 weeks to report on your response to the above.  Normal BMI (Body Mass Index- based on height and weight) is between 19 and 25. Your BMI today is Body mass index is 54.85 kg/m. Marland Kitchen. Please consider follow up  regarding your BMI with your Primary Care Provider.

## 2017-08-07 ENCOUNTER — Ambulatory Visit
Admission: RE | Admit: 2017-08-07 | Discharge: 2017-08-07 | Disposition: A | Discharge status: Home / Self Care | Payer: 59 | Source: Ambulatory Visit | Attending: Orthopaedic Surgery | Admitting: Orthopaedic Surgery

## 2017-08-07 DIAGNOSIS — S83242A Other tear of medial meniscus, current injury, left knee, initial encounter: Secondary | ICD-10-CM | POA: Diagnosis not present

## 2017-08-07 DIAGNOSIS — G8929 Other chronic pain: Secondary | ICD-10-CM

## 2017-08-07 DIAGNOSIS — M25562 Pain in left knee: Principal | ICD-10-CM

## 2017-08-13 IMAGING — CT CT RENAL STONE PROTOCOL
2 of 3 series · 17 of 46 positions shown, 19 images · non-contrast
Comparison: 05/08/2004 CT

CLINICAL DATA: Right flank pain for several weeks. Urinary
frequency. Nausea, vomiting and diarrhea.

EXAM:
CT ABDOMEN AND PELVIS WITHOUT CONTRAST
TECHNIQUE: Multidetector CT imaging of the abdomen and pelvis was performed
following the standard protocol without IV contrast.

[Series 3: lung · axial · 0.83mm/px · z∈[+2082,+2156]mm · 14 of 43 slices shown, 16 images]
[im 3/43  soft-tissue]
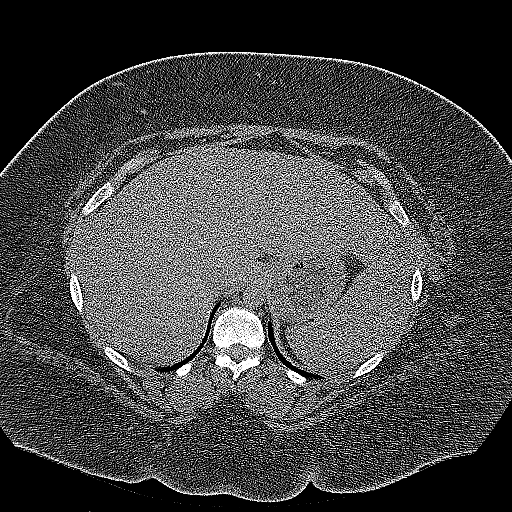
[im 3/43  bone]
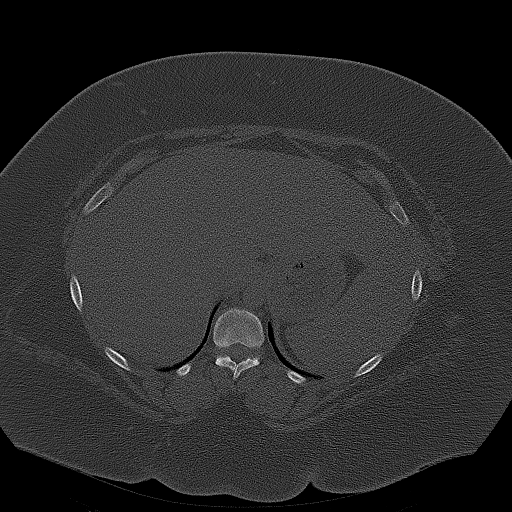
[im 6/43  soft-tissue]
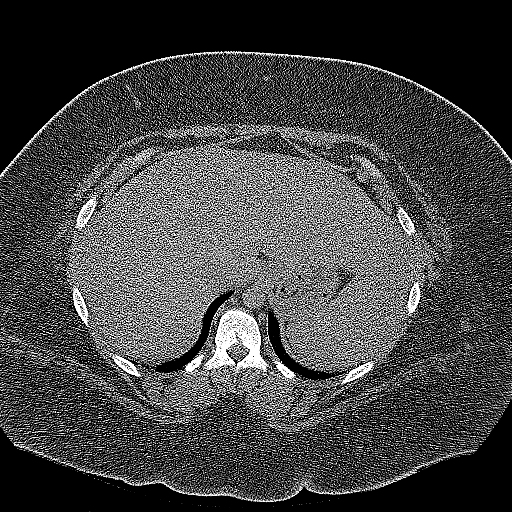
[im 9/43  soft-tissue]
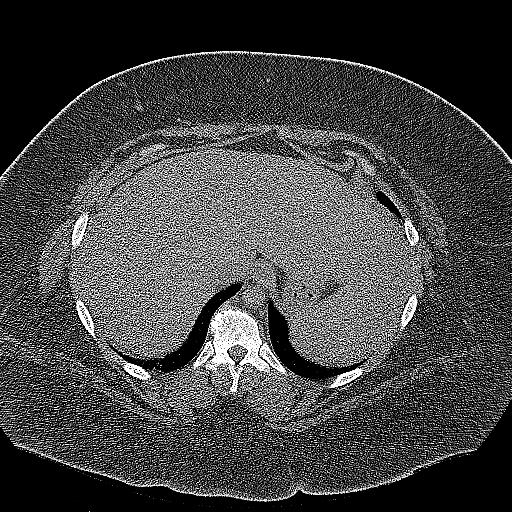
[im 11/43  soft-tissue]
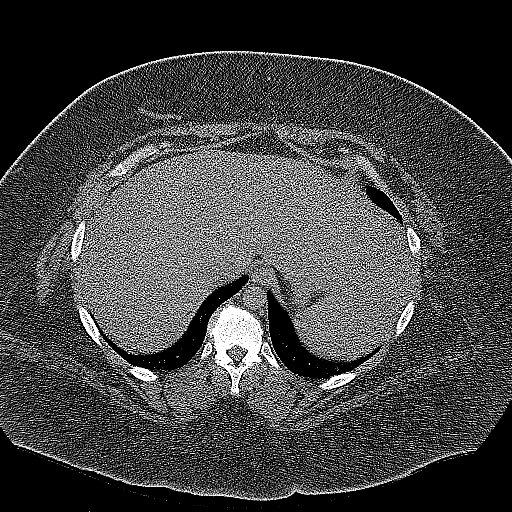
[im 14/43  soft-tissue]
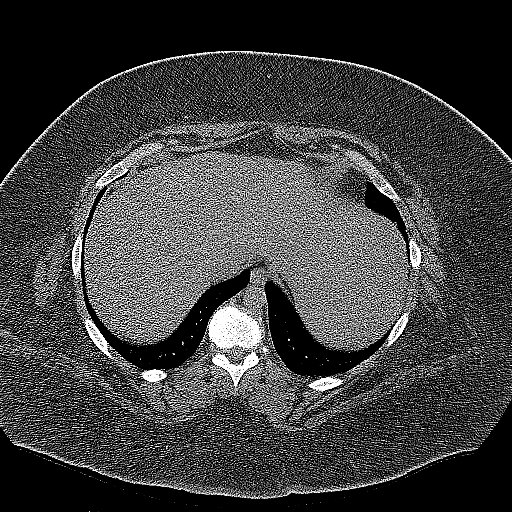
[im 17/43  soft-tissue]
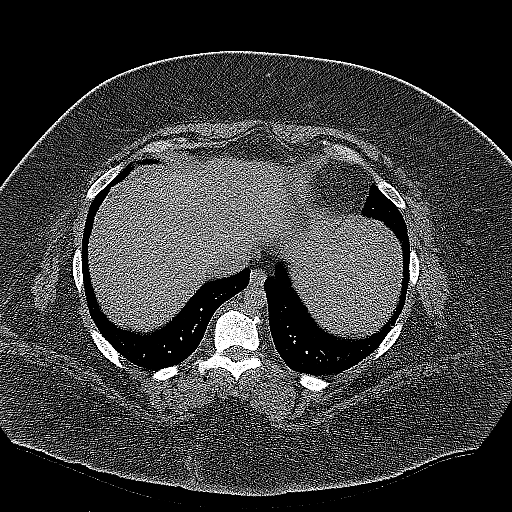
[im 19/43  soft-tissue]
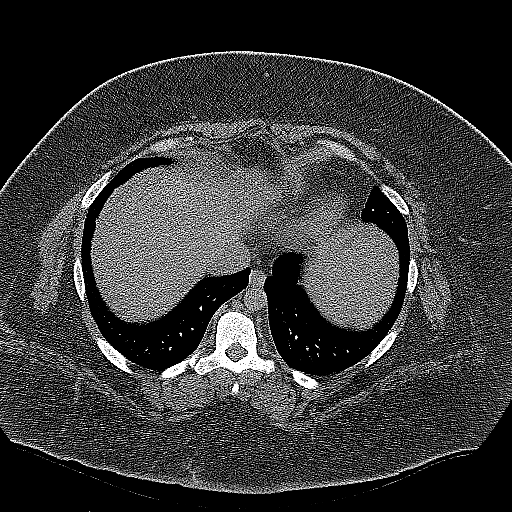
[im 24/43  soft-tissue]
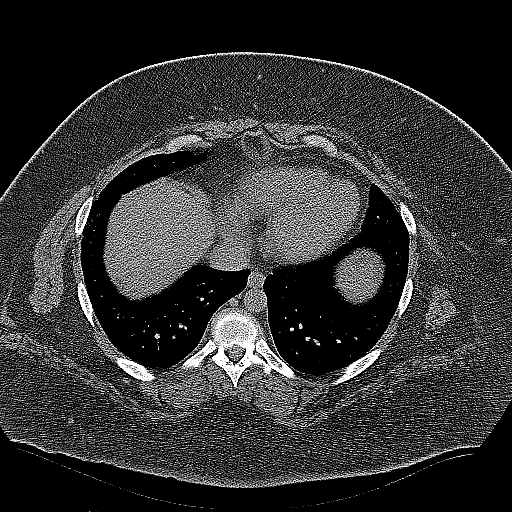
[im 26/43  soft-tissue]
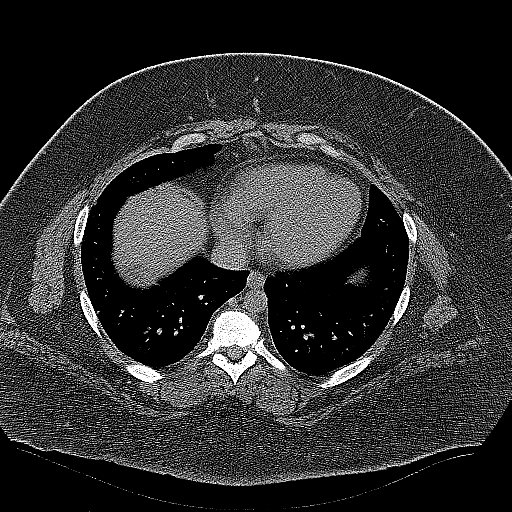
[im 26/43  bone]
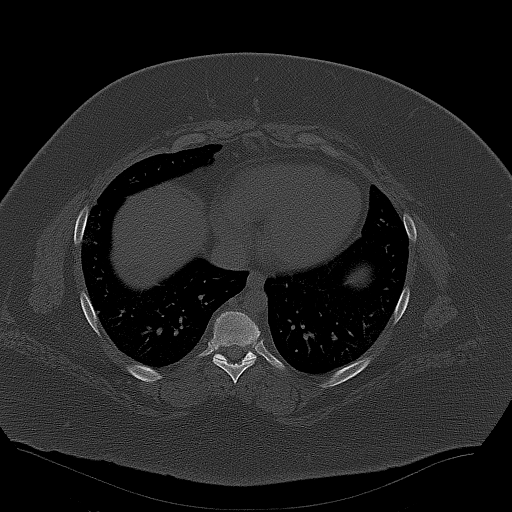
[im 29/43  soft-tissue]
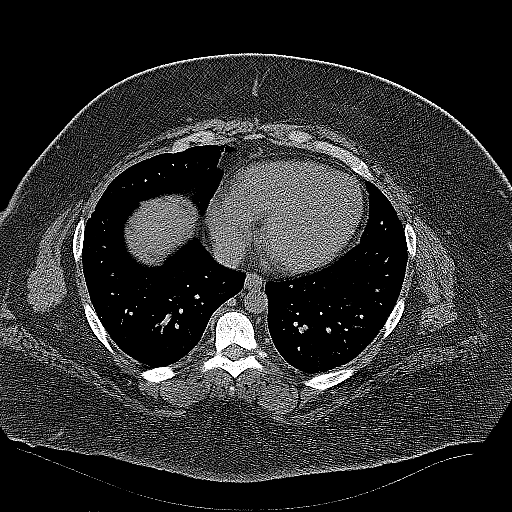
[im 32/43  soft-tissue]
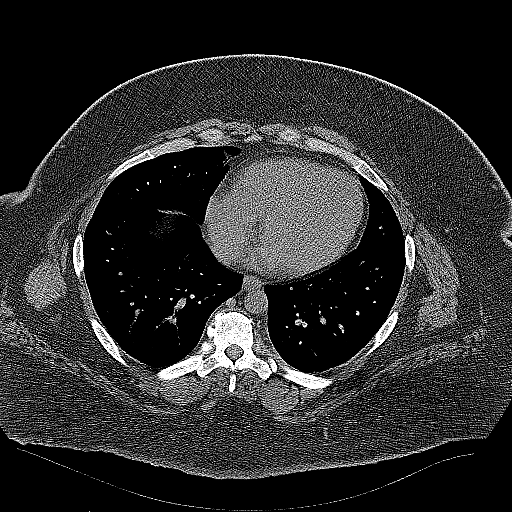
[im 34/43  soft-tissue]
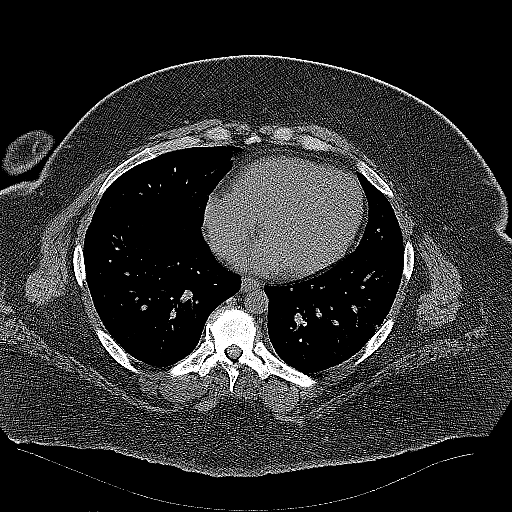
[im 37/43  soft-tissue]
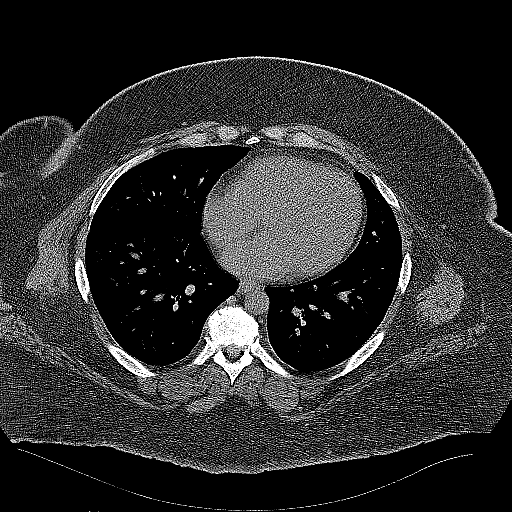
[im 40/43  soft-tissue]
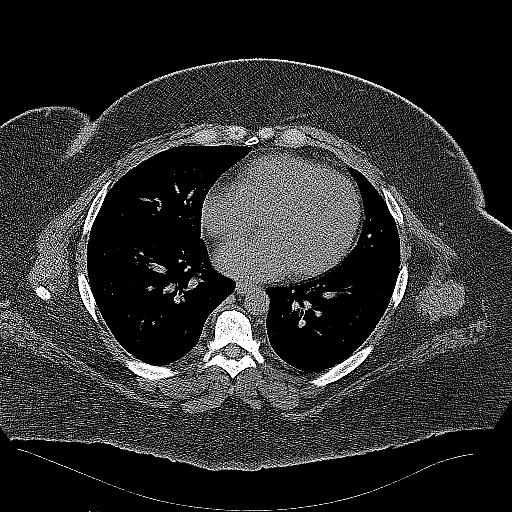

[Series 4: coronal · coronal · 0.80mm/px · 3 of 191 slices shown]
[im 64/191  soft-tissue]
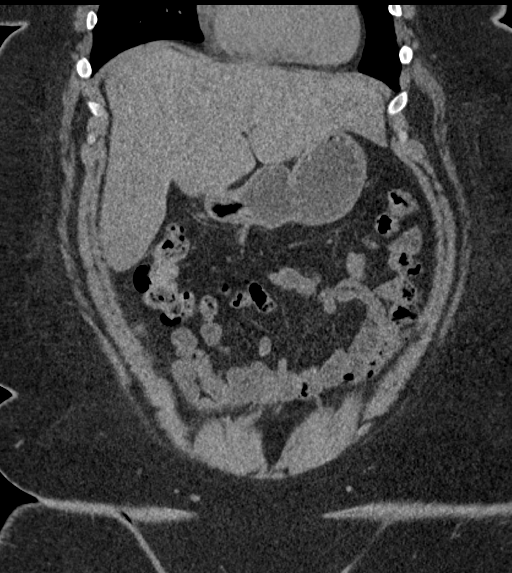
[im 85/191  soft-tissue]
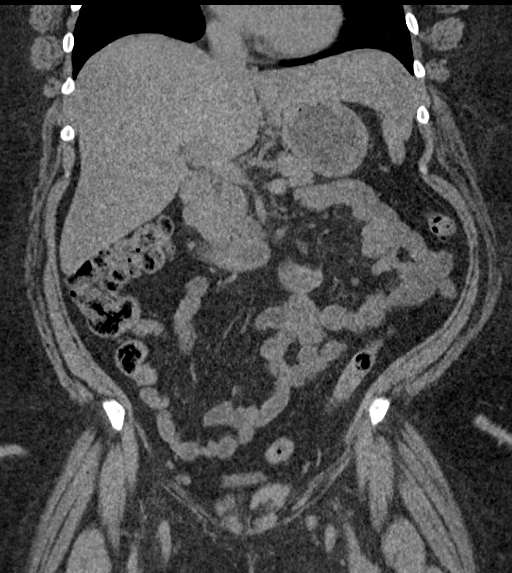
[im 106/191  soft-tissue]
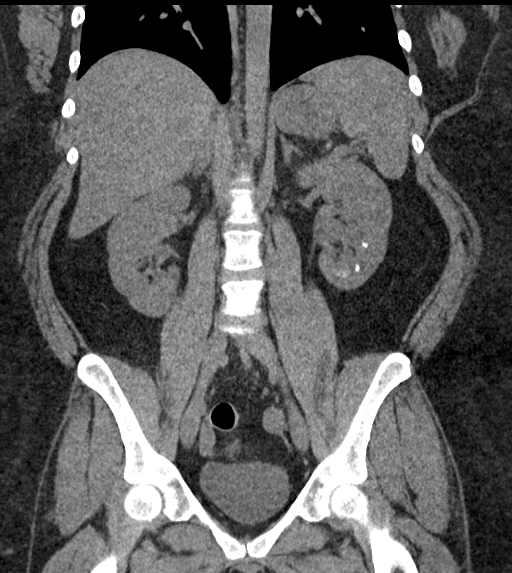

[17 of 46 positions shown; findings below may reference images not displayed]

FINDINGS: Lower chest: No acute abnormality.

Hepatobiliary: Cholecystectomy. No hepatic mass or biliary
dilatation for this unenhanced study.

Pancreas: No pancreatic ductal dilatation or mass. No peripancreatic
inflammation.

Spleen: No splenomegaly.

Adrenals/Urinary Tract: Normal bilateral adrenal glands. No
obstructive uropathy on the right. Numerous mid to lower pole renal
calculi are again noted the largest in the lower pole measuring 17 x
10 mm. No hydroureter. Urinary bladder is physiologically distended
without calculus.

Stomach/Bowel: No bowel obstruction or inflammation.  Appendectomy.

Vascular/Lymphatic: No aortic aneurysm or adenopathy.

Reproductive: Uterus and bilateral adnexa are unremarkable.

Other: No abdominal wall hernia or abnormality. No abdominopelvic
ascites.

Musculoskeletal: Degenerative disc disease with discogenic sclerosis
of the endplates at L4-5. No acute osseous abnormality.
IMPRESSION: 1. No obstructive uropathy. Nonobstructing left-sided renal calculi,
the largest approximately 17 x 10 mm in the left lower pole.
2. Appendectomy and cholecystectomy.
3. No bowel inflammation or obstruction.
4. Degenerative disc disease L4-5.

## 2017-08-23 ENCOUNTER — Encounter (INDEPENDENT_AMBULATORY_CARE_PROVIDER_SITE_OTHER): Payer: Self-pay | Admitting: Orthopaedic Surgery

## 2017-08-23 ENCOUNTER — Ambulatory Visit (INDEPENDENT_AMBULATORY_CARE_PROVIDER_SITE_OTHER): Payer: 59 | Admitting: Orthopaedic Surgery

## 2017-08-23 DIAGNOSIS — M1712 Unilateral primary osteoarthritis, left knee: Secondary | ICD-10-CM

## 2017-08-23 DIAGNOSIS — S83282A Other tear of lateral meniscus, current injury, left knee, initial encounter: Secondary | ICD-10-CM | POA: Insufficient documentation

## 2017-08-23 DIAGNOSIS — S83282D Other tear of lateral meniscus, current injury, left knee, subsequent encounter: Secondary | ICD-10-CM

## 2017-08-23 NOTE — Progress Notes (Signed)
Office Visit Note   Patient: Tiffany HoughLashara Olden           Date of Birth: 09/25/1980           MRN: [id number] Visit Date: 08/23/2017              Requested by: Veryl Speakalone, Gregory D, FNP [address] [place], [place] [id number] PCP: Veryl Speakalone, Gregory D, FNP   Assessment & Plan: Visit Diagnoses:  1. Unilateral primary osteoarthritis, left knee   2. Acute lateral meniscus tear of left knee, subsequent encounter     Plan: MRI findings are consistent with degenerative changes as well as a complex tear of anterior horn of lateral meniscus.  She has a laterally tracking patella.  These findings were discussed with the patient.  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  Questions encouraged and answered.  She will call when she is ready for us to schedule surgery.  Follow-Up Instructions: Return if symptoms worsen or fail to improve.   Orders:  No orders of the defined types were placed in this encounter.  No orders of the defined types were placed in this encounter.     Procedures: No procedures performed   Clinical Data: No additional findings.   Subjective: Chief Complaint  Patient presents with  . Left Knee - Pain    Patient follows up today for her left knee.  She has recently lost.  She continues to have pain in the anterior aspect of the lateral joint line.  She also has posterior knee pain.    Review of Systems  Constitutional: Negative.   HENT: Negative.   Eyes: Negative.   Respiratory: Negative.   Cardiovascular: Negative.   Endocrine: Negative.   Musculoskeletal: Negative.   Neurological: Negative.   Hematological: Negative.   Psychiatric/Behavioral: Negative.   All other systems reviewed and are negative.    Objective: Vital  Signs: There were no vitals taken for this visit.  Physical Exam  Constitutional: She is oriented to person, place, and time. She appears well-developed and well-nourished.  Pulmonary/Chest: Effort normal.  Neurological: She is alert and oriented to person, place, and time.  Skin: Skin is warm. Capillary refill takes less than 2 seconds.  Psychiatric: She has a normal mood and affect. Her behavior is normal. Judgment and thought content normal.  Nursing note and vitals reviewed.   Ortho Exam Left knee exam shows no joint effusion.  She is tender along the lateral joint line. Specialty Comments:  No specialty comments available.  Imaging: No results found.   PMFS History: Patient Active Problem List   Diagnosis Date Noted  . Acute lateral meniscus tear of left knee 08/23/2017  . Diarrhea 06/01/2017  . Nausea with vomiting 04/18/2017  . Low back pain 04/18/2017  . Occipital neuralgia of right side 04/27/2016  . Common migraine without intractability 04/27/2016  . Neck pain 04/27/2016  . 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 INTERNAL DERANGEMENT, RIGHT KNEE 09/08/2010  . Lateral meniscus tear 09/08/2010   Past Medical History:  Diagnosis Date  . Headache   . Sciatic nerve pain   . UTI (lower urinary tract infection)     Family History  Problem Relation Age of Onset  . Colon cancer Maternal Grandmother   . Healthy Mother   .  Congestive Heart Failure Father     Past Surgical History:  Procedure Laterality Date  . APPENDECTOMY    . CHOLECYSTECTOMY    . ENDOMETRIAL ABLATION    . KNEE ARTHROSCOPY    . SHOULDER SURGERY Left   . TONSILLECTOMY     Social History   Occupational History  . Not on file  Tobacco Use  . Smoking status: Current Every Day Smoker    Packs/day: 0.75    Years: 10.00    Pack years: 7.50    Types: Cigarettes  . Smokeless tobacco: Never Used  Substance and Sexual Activity  . Alcohol use: No  . Drug use: Yes     Frequency: 7.0 times per week    Types: Marijuana  . Sexual activity: Not Currently    Birth control/protection: None

## 2017-08-31 ENCOUNTER — Telehealth (INDEPENDENT_AMBULATORY_CARE_PROVIDER_SITE_OTHER): Payer: Self-pay | Admitting: Orthopaedic Surgery

## 2017-08-31 NOTE — Telephone Encounter (Signed)
Patient states that Tramadol was supposed to be called in for her at her last visit 08/23/17. She said the pharmacy never received this. [facility] in [facility] to [facility]? Patients # [phone]

## 2017-09-01 MED ORDER — TRAMADOL HCL 50 MG PO TABS: 50.0000 mg | ORAL_TABLET | Freq: Two times a day (BID) | ORAL | 0 refills | Status: DC | PRN

## 2017-09-01 NOTE — Telephone Encounter (Signed)
Yes refill

## 2017-09-01 NOTE — Telephone Encounter (Signed)
I called in rx to pharmacy. I called patient to make aware.

## 2017-09-01 NOTE — Addendum Note (Signed)
Addended by: Donalee CitrinPEELE, STEPHENEY L on: 09/01/2017 10:38 AM   Modules accepted: Orders

## 2017-11-28 ENCOUNTER — Other Ambulatory Visit: Payer: Self-pay

## 2017-11-28 ENCOUNTER — Encounter (HOSPITAL_BASED_OUTPATIENT_CLINIC_OR_DEPARTMENT_OTHER): Payer: Self-pay | Admitting: *Deleted

## 2017-11-28 DIAGNOSIS — F17210 Nicotine dependence, cigarettes, uncomplicated: Secondary | ICD-10-CM | POA: Insufficient documentation

## 2017-11-28 DIAGNOSIS — L02411 Cutaneous abscess of right axilla: Secondary | ICD-10-CM | POA: Diagnosis not present

## 2017-11-28 DIAGNOSIS — Z79899 Other long term (current) drug therapy: Secondary | ICD-10-CM | POA: Insufficient documentation

## 2017-11-28 NOTE — ED Triage Notes (Signed)
Abscess to her right axilla. She has had several drained in the past.

## 2017-11-29 ENCOUNTER — Emergency Department (HOSPITAL_BASED_OUTPATIENT_CLINIC_OR_DEPARTMENT_OTHER)
Admission: EM | Admit: 2017-11-29 | Discharge: 2017-11-29 | Disposition: A | Discharge status: Home / Self Care | Payer: 59 | Attending: Emergency Medicine | Admitting: Emergency Medicine

## 2017-11-29 DIAGNOSIS — L02411 Cutaneous abscess of right axilla: Secondary | ICD-10-CM

## 2017-11-29 LAB — CBG MONITORING, ED: GLUCOSE-CAPILLARY: 87 mg/dL (ref 65–99)

## 2017-11-29 MED ORDER — LIDOCAINE-EPINEPHRINE (PF) 2 %-1:200000 IJ SOLN
20.0000 mL | Freq: Once | INTRAMUSCULAR | Status: AC
  Administered 2017-11-29: 20 mL
  Filled 2017-11-29: qty 20

## 2017-11-29 MED ORDER — DOXYCYCLINE HYCLATE 100 MG PO CAPS
100.0000 mg | ORAL_CAPSULE | Freq: Two times a day (BID) | ORAL | 0 refills | Status: DC
Start: 2017-11-29 — End: 2018-03-21

## 2017-11-29 NOTE — ED Provider Notes (Signed)
[facility] EMERGENCY DEPARTMENT Provider Note   CSN: [id number] Arrival date & time: 11/28/17  1747     History   Chief Complaint Chief Complaint  Patient presents with  . Abscess    HPI Tiffany Conley is a 37 y.o. female.  Patient presents with right axillary abscess for the past 2 weeks.  States that it went down but then came back again more swollen and painful.  Denies any bleeding or drainage.  No fever, chills, nausea or vomiting.  She is not a diabetic.  She has had abscesses multiple times in the past.  She is been trying warm compresses without relief.  The history is provided by the patient.  Abscess  Associated symptoms: no fever, no headaches, no nausea and no vomiting     Past Medical History:  Diagnosis Date  . Headache   . Sciatic nerve pain   . UTI (lower urinary tract infection)     Patient Active Problem List   Diagnosis Date Noted  . Acute lateral meniscus tear of left knee 08/23/2017  . Diarrhea 06/01/2017  . Nausea with vomiting 04/18/2017  . Low back pain 04/18/2017  . Occipital neuralgia of right side 04/27/2016  . Common migraine without intractability 04/27/2016  . Neck pain 04/27/2016  . 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 INTERNAL DERANGEMENT, RIGHT KNEE 09/08/2010  . Lateral meniscus tear 09/08/2010    Past Surgical History:  Procedure Laterality Date  . APPENDECTOMY    . CHOLECYSTECTOMY    . ENDOMETRIAL ABLATION    . KNEE ARTHROSCOPY    . SHOULDER SURGERY Left   . TONSILLECTOMY       OB History    Gravida  0   Para  0   Term  0   Preterm  0   AB  0   Living  0     SAB  0   TAB  0   Ectopic  0   Multiple  0   Live Births               Home Medications    Prior to Admission medications   Medication Sig Start Date End Date Taking? Authorizing Provider  albuterol (PROVENTIL HFA;VENTOLIN HFA) 108 (90 Base) MCG/ACT inhaler Inhale 2 puffs into the lungs every 4  (four) hours as needed for wheezing or shortness of breath. 07/22/16   Gilda Crease, MD  cholestyramine light (PREVALITE) 4 g packet Take 1 packet (4 g total) by mouth daily. 08/01/17 08/31/17  Rachael Fee, MD  naproxen (NAPROSYN) 500 MG tablet Take 1 tablet (500 mg total) by mouth 2 (two) times daily with a meal. 04/01/17   Everlene Farrier, PA-C  traMADol (ULTRAM) 50 MG tablet Take 1 tablet (50 mg total) by mouth every 12 (twelve) hours as needed. 09/01/17   Tarry Kos, MD    Family History Family History  Problem Relation Age of Onset  . Colon cancer Maternal Grandmother   . Healthy Mother   . Congestive Heart Failure Father     Social History Social History   Tobacco Use  . Smoking status: Current Every Day Smoker    Packs/day: 0.75    Years: 10.00    Pack years: 7.50    Types: Cigarettes  . Smokeless tobacco: Never Used  Substance Use Topics  . Alcohol use: No  . Drug use: Yes    Frequency: 7.0 times per week    Types:  Marijuana     Allergies   Citrus; Ibuprofen; and Vicodin [hydrocodone-acetaminophen]   Review of Systems Review of Systems  Constitutional: Negative for activity change, appetite change and fever.  Respiratory: Negative for chest tightness.   Cardiovascular: Negative for chest pain.  Gastrointestinal: Negative for abdominal pain, nausea and vomiting.  Genitourinary: Negative for dysuria, hematuria, vaginal bleeding and vaginal discharge.  Skin: Positive for wound. Negative for rash.  Neurological: Negative for dizziness, weakness and headaches.    all other systems are negative except as noted in the HPI and PMH.    Physical Exam Updated Vital Signs BP (!) 140/106 (BP Location: Right Wrist)   Pulse 80   Temp 98.7 F (37.1 C) (Oral)   Resp 18   Ht 5' 6.5" (1.689 m)   Wt (!) 156.5 kg (345 lb)   LMP 11/14/2017   SpO2 100%   BMI 54.85 kg/m   Physical Exam  Constitutional: She is oriented to person, place, and time. She  appears well-developed and well-nourished. No distress.  HENT:  Head: Normocephalic and atraumatic.  Mouth/Throat: Oropharynx is clear and moist. No oropharyngeal exudate.  Eyes: Pupils are equal, round, and reactive to light. Conjunctivae and EOM are normal.  Neck: Normal range of motion. Neck supple.  No meningismus.  Cardiovascular: Normal rate, regular rhythm, normal heart sounds and intact distal pulses.  No murmur heard. Pulmonary/Chest: Effort normal and breath sounds normal. No respiratory distress.  Abdominal: Soft. There is no tenderness. There is no rebound and no guarding.  Musculoskeletal: Normal range of motion. She exhibits tenderness. She exhibits no edema.  Right axillary abscess approximately 3 cm x 6 cm with fluctuance and central punctum with purulence.  No surrounding cellulitis  Neurological: She is alert and oriented to person, place, and time. No cranial nerve deficit. She exhibits normal muscle tone. Coordination normal.   5/5 strength throughout. CN 2-12 intact.Equal grip strength.   Skin: Skin is warm.  Psychiatric: She has a normal mood and affect. Her behavior is normal.  Nursing note and vitals reviewed.    ED Treatments / Results  Labs (all labs ordered are listed, but only abnormal results are displayed) Labs Reviewed  CBG MONITORING, ED    EKG None  Radiology No results found.  Procedures .Marland Kitchen.Incision and Drainage Date/Time: 11/29/2017 1:43 AM Performed by: Glynn Octaveancour, Camerin Ladouceur, MD Authorized by: Glynn Octaveancour, Unique Searfoss, MD   Consent:    Consent obtained:  Verbal   Consent given by:  Patient   Risks discussed:  Bleeding, incomplete drainage, pain and infection   Alternatives discussed:  No treatment Location:    Type:  Abscess   Size:  4   Location:  Upper extremity   Upper extremity location: axilla. Pre-procedure details:    Skin preparation:  Betadine Anesthesia (see MAR for exact dosages):    Anesthesia method:  Local infiltration   Local  anesthetic:  Lidocaine 2% WITH epi Procedure type:    Complexity:  Simple Procedure details:    Needle aspiration: no     Incision types:  Elliptical   Incision depth:  Subcutaneous   Scalpel blade:  11   Wound management:  Probed and deloculated, extensive cleaning and irrigated with saline   Drainage:  Purulent   Drainage amount:  Moderate   Wound treatment:  Drain placed   Packing materials:  1/4 in iodoform gauze Post-procedure details:    Patient tolerance of procedure:  Tolerated well, no immediate complications   (including critical care time)  Medications  Ordered in ED Medications  lidocaine-EPINEPHrine (XYLOCAINE W/EPI) 2 %-1:200000 (PF) injection 20 mL (20 mLs Infiltration Given 11/29/17 0029)     Initial Impression / Assessment and Plan / ED Course  I have reviewed the triage vital signs and the nursing notes.  Pertinent labs & imaging results that were available during my care of the patient were reviewed by me and considered in my medical decision making (see chart for details).    Patient with recurrent axillary abscess.  She is in no distress and hemodynamically stable.  Blood sugar is normal.  She is driving and declines pain medication.  Incision and drainage performed as above.  Discussed with patient that she should follow-up for wound check in 2 days.  We will also give antibiotics.  highly suspect patient has hidradenitis and will refer to surgery for evaluation as well.  Return precautions discussed.  Final Clinical Impressions(s) / ED Diagnoses   Final diagnoses:  Abscess of axilla, right    ED Discharge Orders    None       Sosie Gato, Jeannett Senior, MD 11/29/17 [phone]

## 2017-11-29 NOTE — Discharge Instructions (Addendum)
Perform the wound care as we discussed.  Take the antibiotics as prescribed.  Return for wound check in 2 days.  Return sooner if you develop worsening pain, bleeding, fever, vomiting or other concerns.

## 2017-12-01 ENCOUNTER — Emergency Department (HOSPITAL_BASED_OUTPATIENT_CLINIC_OR_DEPARTMENT_OTHER)
Admission: EM | Admit: 2017-12-01 | Discharge: 2017-12-01 | Disposition: A | Discharge status: Home / Self Care | Payer: 59 | Attending: Emergency Medicine | Admitting: Emergency Medicine

## 2017-12-01 ENCOUNTER — Encounter (HOSPITAL_BASED_OUTPATIENT_CLINIC_OR_DEPARTMENT_OTHER): Payer: Self-pay

## 2017-12-01 ENCOUNTER — Other Ambulatory Visit: Payer: Self-pay

## 2017-12-01 DIAGNOSIS — F17210 Nicotine dependence, cigarettes, uncomplicated: Secondary | ICD-10-CM | POA: Insufficient documentation

## 2017-12-01 DIAGNOSIS — L02411 Cutaneous abscess of right axilla: Secondary | ICD-10-CM | POA: Diagnosis not present

## 2017-12-01 DIAGNOSIS — Z09 Encounter for follow-up examination after completed treatment for conditions other than malignant neoplasm: Secondary | ICD-10-CM | POA: Diagnosis present

## 2017-12-01 DIAGNOSIS — F1210 Cannabis abuse, uncomplicated: Secondary | ICD-10-CM | POA: Insufficient documentation

## 2017-12-01 NOTE — ED Provider Notes (Signed)
[facility] EMERGENCY DEPARTMENT Provider Note   CSN: [id number] Arrival date & time: 12/01/17  1122     History   Chief Complaint Chief Complaint  Patient presents with  . Follow-up    HPI Tiffany Conley is a 37 y.o. female.  Patient is a 37 year old female who is returning today after having an abscess drained several days ago and here for a recheck.  She states it is still pretty painful but redness and swelling have significantly improved.  She has been taking her antibiotic regularly.  She has been doing warm soaks.  She states this happens recurrently.  She has no other complaints.  The history is provided by the patient.    Past Medical History:  Diagnosis Date  . Headache   . Sciatic nerve pain   . UTI (lower urinary tract infection)     Patient Active Problem List   Diagnosis Date Noted  . Acute lateral meniscus tear of left knee 08/23/2017  . Diarrhea 06/01/2017  . Nausea with vomiting 04/18/2017  . Low back pain 04/18/2017  . Occipital neuralgia of right side 04/27/2016  . Common migraine without intractability 04/27/2016  . Neck pain 04/27/2016  . 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 INTERNAL DERANGEMENT, RIGHT KNEE 09/08/2010  . Lateral meniscus tear 09/08/2010    Past Surgical History:  Procedure Laterality Date  . APPENDECTOMY    . CHOLECYSTECTOMY    . ENDOMETRIAL ABLATION    . KNEE ARTHROSCOPY    . SHOULDER SURGERY Left   . TONSILLECTOMY       OB History    Gravida  0   Para  0   Term  0   Preterm  0   AB  0   Living  0     SAB  0   TAB  0   Ectopic  0   Multiple  0   Live Births               Home Medications    Prior to Admission medications   Medication Sig Start Date End Date Taking? Authorizing Provider  albuterol (PROVENTIL HFA;VENTOLIN HFA) 108 (90 Base) MCG/ACT inhaler Inhale 2 puffs into the lungs every 4 (four) hours as needed for wheezing or shortness of  breath. 07/22/16   Gilda CreasePollina, Christopher J, MD  cholestyramine light (PREVALITE) 4 g packet Take 1 packet (4 g total) by mouth daily. 08/01/17 08/31/17  Rachael FeeJacobs, Daniel P, MD  doxycycline (VIBRAMYCIN) 100 MG capsule Take 1 capsule (100 mg total) by mouth 2 (two) times daily. 11/29/17   Rancour, Jeannett SeniorStephen, MD  naproxen (NAPROSYN) 500 MG tablet Take 1 tablet (500 mg total) by mouth 2 (two) times daily with a meal. 04/01/17   Everlene Farrieransie, William, PA-C  traMADol (ULTRAM) 50 MG tablet Take 1 tablet (50 mg total) by mouth every 12 (twelve) hours as needed. 09/01/17   Tarry KosXu, Naiping M, MD    Family History Family History  Problem Relation Age of Onset  . Colon cancer Maternal Grandmother   . Healthy Mother   . Congestive Heart Failure Father     Social History Social History   Tobacco Use  . Smoking status: Current Every Day Smoker    Packs/day: 0.75    Years: 10.00    Pack years: 7.50    Types: Cigarettes  . Smokeless tobacco: Never Used  Substance Use Topics  . Alcohol use: No  . Drug use: Yes  Frequency: 7.0 times per week    Types: Marijuana     Allergies   Citrus; Ibuprofen; and Vicodin [hydrocodone-acetaminophen]   Review of Systems Review of Systems  All other systems reviewed and are negative.    Physical Exam Updated Vital Signs BP 140/84 (BP Location: Left Arm)   Pulse 89   Temp 99.2 F (37.3 C) (Oral)   Resp 20   Ht 5\' 6"  (1.676 m)   Wt (!) 157.9 kg (348 lb)   LMP 11/14/2017   SpO2 100%   BMI 56.17 kg/m   Physical Exam  Constitutional: She is oriented to person, place, and time. She appears well-developed and well-nourished. No distress.  HENT:  Head: Normocephalic.  Cardiovascular: Normal rate.  Pulmonary/Chest: Effort normal.  Musculoskeletal:  Abscess present in the right axilla with mild induration and tenderness.  No drainage and packing was removed.  No significant erythema  Neurological: She is alert and oriented to person, place, and time.  Skin:  Skin is warm and dry.  Psychiatric: She has a normal mood and affect. Her behavior is normal.  Nursing note and vitals reviewed.    ED Treatments / Results  Labs (all labs ordered are listed, but only abnormal results are displayed) Labs Reviewed - No data to display  EKG None  Radiology No results found.  Procedures Procedures (including critical care time)  Medications Ordered in ED Medications - No data to display   Initial Impression / Assessment and Plan / ED Course  I have reviewed the triage vital signs and the nursing notes.  Pertinent labs & imaging results that were available during my care of the patient were reviewed by me and considered in my medical decision making (see chart for details).     Patient here for abscess recheck.  On exam patient does not have significant cellulitis at this time.  Packing was removed and no significant drainage.  Recommended patient continue to do warm soaks and continue her antibiotic.  She needs to only return if she is having worsening symptoms. Final Clinical Impressions(s) / ED Diagnoses   Final diagnoses:  Encounter for recheck of abscess following incision and drainage    ED Discharge Orders    None       Gwyneth Sprout, MD 12/01/17 1246

## 2017-12-01 NOTE — ED Notes (Signed)
Bulky gauze applied to RT axilla; secured with paper tape prior to dc.

## 2017-12-01 NOTE — ED Triage Notes (Signed)
Pt states she is here for recheck of right axilla abscess-NAD-steady gait

## 2018-03-21 ENCOUNTER — Observation Stay (HOSPITAL_COMMUNITY)
Admission: EM | Admit: 2018-03-21 | Discharge: 2018-03-22 | Disposition: A | Discharge status: Home / Self Care | Payer: 59 | Attending: Internal Medicine | Admitting: Internal Medicine

## 2018-03-21 ENCOUNTER — Emergency Department (HOSPITAL_COMMUNITY): Payer: 59

## 2018-03-21 ENCOUNTER — Encounter (HOSPITAL_COMMUNITY): Payer: Self-pay

## 2018-03-21 ENCOUNTER — Other Ambulatory Visit: Payer: Self-pay

## 2018-03-21 DIAGNOSIS — F17210 Nicotine dependence, cigarettes, uncomplicated: Secondary | ICD-10-CM | POA: Insufficient documentation

## 2018-03-21 DIAGNOSIS — R030 Elevated blood-pressure reading, without diagnosis of hypertension: Secondary | ICD-10-CM | POA: Diagnosis not present

## 2018-03-21 DIAGNOSIS — R918 Other nonspecific abnormal finding of lung field: Secondary | ICD-10-CM | POA: Insufficient documentation

## 2018-03-21 DIAGNOSIS — R0602 Shortness of breath: Secondary | ICD-10-CM | POA: Diagnosis not present

## 2018-03-21 DIAGNOSIS — Z9049 Acquired absence of other specified parts of digestive tract: Secondary | ICD-10-CM | POA: Diagnosis not present

## 2018-03-21 DIAGNOSIS — Z6841 Body Mass Index (BMI) 40.0 and over, adult: Secondary | ICD-10-CM | POA: Insufficient documentation

## 2018-03-21 DIAGNOSIS — J209 Acute bronchitis, unspecified: Secondary | ICD-10-CM | POA: Diagnosis not present

## 2018-03-21 DIAGNOSIS — Z720 Tobacco use: Secondary | ICD-10-CM

## 2018-03-21 DIAGNOSIS — Z91018 Allergy to other foods: Secondary | ICD-10-CM | POA: Insufficient documentation

## 2018-03-21 DIAGNOSIS — R0600 Dyspnea, unspecified: Secondary | ICD-10-CM

## 2018-03-21 DIAGNOSIS — R911 Solitary pulmonary nodule: Secondary | ICD-10-CM | POA: Diagnosis not present

## 2018-03-21 DIAGNOSIS — R0609 Other forms of dyspnea: Secondary | ICD-10-CM

## 2018-03-21 DIAGNOSIS — R05 Cough: Secondary | ICD-10-CM | POA: Diagnosis not present

## 2018-03-21 DIAGNOSIS — J45901 Unspecified asthma with (acute) exacerbation: Secondary | ICD-10-CM | POA: Diagnosis present

## 2018-03-21 DIAGNOSIS — Z886 Allergy status to analgesic agent status: Secondary | ICD-10-CM | POA: Diagnosis not present

## 2018-03-21 DIAGNOSIS — J40 Bronchitis, not specified as acute or chronic: Secondary | ICD-10-CM | POA: Diagnosis present

## 2018-03-21 DIAGNOSIS — Z885 Allergy status to narcotic agent status: Secondary | ICD-10-CM | POA: Insufficient documentation

## 2018-03-21 LAB — BASIC METABOLIC PANEL
Anion gap: 7 (ref 5–15)
BUN: 9 mg/dL (ref 6–20)
CALCIUM: 8.5 mg/dL — AB (ref 8.9–10.3)
CHLORIDE: 108 mmol/L (ref 98–111)
CO2: 23 mmol/L (ref 22–32)
Creatinine, Ser: 0.68 mg/dL (ref 0.44–1.00)
GFR calc Af Amer: >60 mL/min (ref >60)
GFR calc non Af Amer: >60 mL/min (ref >60)
GLUCOSE: 101 mg/dL — AB (ref 70–99)
Potassium: 4 mmol/L (ref 3.5–5.1)
Sodium: 138 mmol/L (ref 135–145)

## 2018-03-21 LAB — I-STAT BETA HCG BLOOD, ED (MC, WL, AP ONLY): I-stat hCG, quantitative: <5 m[IU]/mL (ref <5)

## 2018-03-21 LAB — I-STAT TROPONIN, ED: TROPONIN I, POC: 0.03 ng/mL (ref 0.00–0.08)

## 2018-03-21 LAB — CBC
HCT: 41.1 % (ref 36.0–46.0)
HEMOGLOBIN: 14 g/dL (ref 12.0–15.0)
MCH: 32 pg (ref 26.0–34.0)
MCHC: 34.1 g/dL (ref 30.0–36.0)
MCV: 93.8 fL (ref 78.0–100.0)
Platelets: 202 10*3/uL (ref 150–400)
RBC: 4.38 MIL/uL (ref 3.87–5.11)
RDW: 13.4 % (ref 11.5–15.5)
WBC: 9.6 10*3/uL (ref 4.0–10.5)

## 2018-03-21 LAB — PROCALCITONIN

## 2018-03-21 MED ORDER — ACETAMINOPHEN 325 MG PO TABS: 650.0000 mg | ORAL_TABLET | Freq: Four times a day (QID) | ORAL | Status: DC | PRN

## 2018-03-21 MED ORDER — ALBUTEROL SULFATE (2.5 MG/3ML) 0.083% IN NEBU: 2.5000 mg | INHALATION_SOLUTION | RESPIRATORY_TRACT | Status: DC | PRN

## 2018-03-21 MED ORDER — SENNOSIDES-DOCUSATE SODIUM 8.6-50 MG PO TABS: 1.0000 | ORAL_TABLET | Freq: Every evening | ORAL | Status: DC | PRN

## 2018-03-21 MED ORDER — METHYLPREDNISOLONE SODIUM SUCC 125 MG IJ SOLR
125.0000 mg | Freq: Once | INTRAMUSCULAR | Status: AC
  Administered 2018-03-21: 125 mg via INTRAVENOUS
  Filled 2018-03-21: qty 2

## 2018-03-21 MED ORDER — IOPAMIDOL (ISOVUE-370) INJECTION 76%
INTRAVENOUS | Status: AC
  Administered 2018-03-21: 12:00:00
  Filled 2018-03-21: qty 100

## 2018-03-21 MED ORDER — ALBUTEROL SULFATE (2.5 MG/3ML) 0.083% IN NEBU
INHALATION_SOLUTION | RESPIRATORY_TRACT | Status: AC
  Administered 2018-03-21: 11:00:00
  Filled 2018-03-21: qty 3

## 2018-03-21 MED ORDER — ALBUTEROL SULFATE (2.5 MG/3ML) 0.083% IN NEBU
5.0000 mg | INHALATION_SOLUTION | Freq: Once | RESPIRATORY_TRACT | Status: AC
  Administered 2018-03-21: 5 mg via RESPIRATORY_TRACT
  Filled 2018-03-21: qty 6

## 2018-03-21 MED ORDER — ONDANSETRON HCL 4 MG PO TABS: 4.0000 mg | ORAL_TABLET | Freq: Four times a day (QID) | ORAL | Status: DC | PRN

## 2018-03-21 MED ORDER — PREDNISONE 20 MG PO TABS
50.0000 mg | ORAL_TABLET | Freq: Every day | ORAL | Status: DC
  Administered 2018-03-22: 50 mg via ORAL
  Filled 2018-03-21: qty 2

## 2018-03-21 MED ORDER — IOPAMIDOL (ISOVUE-370) INJECTION 76%
100.0000 mL | Freq: Once | INTRAVENOUS | Status: AC | PRN
  Administered 2018-03-21: 100 mL via INTRAVENOUS

## 2018-03-21 MED ORDER — ACETAMINOPHEN 650 MG RE SUPP: 650.0000 mg | Freq: Four times a day (QID) | RECTAL | Status: DC | PRN

## 2018-03-21 MED ORDER — ARFORMOTEROL TARTRATE 15 MCG/2ML IN NEBU
15.0000 ug | INHALATION_SOLUTION | Freq: Two times a day (BID) | RESPIRATORY_TRACT | Status: DC
  Administered 2018-03-21: 15 ug via RESPIRATORY_TRACT
  Filled 2018-03-21 (×3): qty 2

## 2018-03-21 MED ORDER — IPRATROPIUM-ALBUTEROL 0.5-2.5 (3) MG/3ML IN SOLN
3.0000 mL | Freq: Three times a day (TID) | RESPIRATORY_TRACT | Status: DC
  Administered 2018-03-22: 3 mL via RESPIRATORY_TRACT
  Filled 2018-03-21 (×2): qty 3

## 2018-03-21 MED ORDER — IPRATROPIUM BROMIDE 0.02 % IN SOLN
0.5000 mg | Freq: Once | RESPIRATORY_TRACT | Status: AC
  Administered 2018-03-21: 0.5 mg via RESPIRATORY_TRACT
  Filled 2018-03-21: qty 2.5

## 2018-03-21 MED ORDER — ALBUTEROL (5 MG/ML) CONTINUOUS INHALATION SOLN
10.0000 mg/h | INHALATION_SOLUTION | Freq: Once | RESPIRATORY_TRACT | Status: AC
  Administered 2018-03-21: 10 mg/h via RESPIRATORY_TRACT
  Filled 2018-03-21: qty 20

## 2018-03-21 MED ORDER — ENOXAPARIN SODIUM 80 MG/0.8ML ~~LOC~~ SOLN
75.0000 mg | SUBCUTANEOUS | Status: DC
  Administered 2018-03-21: 75 mg via SUBCUTANEOUS
  Filled 2018-03-21: qty 0.75
  Filled 2018-03-21: qty 0.8

## 2018-03-21 MED ORDER — ONDANSETRON HCL 4 MG/2ML IJ SOLN: 4.0000 mg | Freq: Four times a day (QID) | INTRAMUSCULAR | Status: DC | PRN

## 2018-03-21 MED ORDER — BUDESONIDE 0.5 MG/2ML IN SUSP
0.5000 mg | Freq: Two times a day (BID) | RESPIRATORY_TRACT | Status: DC
  Administered 2018-03-21 – 2018-03-22 (×2): 0.5 mg via RESPIRATORY_TRACT
  Filled 2018-03-21 (×2): qty 2

## 2018-03-21 MED ORDER — SODIUM CHLORIDE 0.9 % IV SOLN
INTRAVENOUS | Status: DC
  Administered 2018-03-21: 10:00:00 via INTRAVENOUS

## 2018-03-21 MED ORDER — TRAMADOL HCL 50 MG PO TABS
50.0000 mg | ORAL_TABLET | Freq: Four times a day (QID) | ORAL | Status: DC | PRN
  Administered 2018-03-21: 50 mg via ORAL
  Filled 2018-03-21 (×2): qty 1

## 2018-03-21 MED ORDER — IPRATROPIUM-ALBUTEROL 0.5-2.5 (3) MG/3ML IN SOLN
3.0000 mL | Freq: Four times a day (QID) | RESPIRATORY_TRACT | Status: DC
  Administered 2018-03-21: 3 mL via RESPIRATORY_TRACT
  Filled 2018-03-21: qty 3

## 2018-03-21 NOTE — ED Triage Notes (Signed)
Patient reports that she woke yesterday and felt like someone was sitting on her chest and has been constant. Patient also c/o SOB and a productive cough with yellow sputum. Patient also has body aches and chills.

## 2018-03-21 NOTE — Progress Notes (Signed)
Pt instructed on use of flutter valve.  Pt demonstrated teach back technique and had excellent response with flutter.  Pt encouraged to use flutter valve at least once an hour and to try and build up until she is able to do 8-10 breaths each time.

## 2018-03-21 NOTE — H&P (Signed)
History and Physical    Pretty Weltman AVW:[id number] DOB: 09/11/1980  DOA: 03/21/2018 PCP: Veryl Speak, FNP  Patient coming from: Home   Chief Complaint: SOB   HPI: Tiffany Conley is a 37 y.o. female with no significant medical history presented to the emergency department complaining of shortness of breath associated with cough and chest discomfort.  Patientstated symptoms started suddenly 1 day prior to admission.  Associated symptoms include suggestive fever and body aches.  Also reports dyspnea on exertion, however denies orthopnea and lower extremity edema.  Patient used her home inhaler with no relief.  Patient denies any sick contacts or recent travel.  Patient is active smoker.  ED Course: Upon ED evaluation patient was found to be tachycardic, dyspneic but not hypoxic, she was given albuterol and Solu-Medrol with some improvement.  CT of the chest was performed due to concerns of PE however this was negative for blood clot. 5 mm pulmonary nodule noted on left lung apex. ED physician attempted to discharge patient however she was unable to ambulate without having severe dyspnea.    Review of Systems:   All ROS reviewed and negative except the ones mentioned above.  Past Medical History:  Diagnosis Date  . Headache   . Sciatic nerve pain   . UTI (lower urinary tract infection)     Past Surgical History:  Procedure Laterality Date  . APPENDECTOMY    . CHOLECYSTECTOMY    . ENDOMETRIAL ABLATION    . KNEE ARTHROSCOPY    . SHOULDER SURGERY Left   . TONSILLECTOMY       reports that she has been smoking cigarettes.  She has a 5.00 pack-year smoking history. She has never used smokeless tobacco. She reports that she has current or past drug history. Drug: Marijuana. Frequency: 7.00 times per week. She reports that she does not drink alcohol.  Allergies  Allergen Reactions  . Citrus Itching and Rash  . Ibuprofen Nausea Only  . Vicodin  [Hydrocodone-Acetaminophen] Nausea And Vomiting    Family History  Problem Relation Age of Onset  . Colon cancer Maternal Grandmother   . Healthy Mother   . Congestive Heart Failure Father     Prior to Admission medications   Medication Sig Start Date End Date Taking? Authorizing Provider  albuterol (PROVENTIL HFA;VENTOLIN HFA) 108 (90 Base) MCG/ACT inhaler Inhale 2 puffs into the lungs every 4 (four) hours as needed for wheezing or shortness of breath. 07/22/16  Yes Gilda Crease, MD    Physical Exam: Vitals:   03/21/18 0924 03/21/18 1059 03/21/18 1352 03/21/18 1616  BP:  (!) 154/83  (!) 156/90  Pulse:  95  (!) 102  Resp:  20  16  Temp:  98.3 F (36.8 C)    TempSrc:      SpO2: 100% 100% 98% 100%  Weight:      Height:         Constitutional: NAD, calm, comfortable Eyes: PERRL, lids and conjunctivae normal ENMT: Mucous membranes are moist. Posterior pharynx clear of any exudate or lesions.  Neck: normal, supple, no masses, no thyromegaly Respiratory:  breath sounds distant, mild diffuse wheezing, no crackles or rales.  No use of accessory muscle.  Difficult auscultation due to body habitus. Cardiovascular: Regular rate and rhythm, no murmurs / rubs / gallops. No extremity edema.  Abdomen: no tenderness, no masses palpated. No hepatosplenomegaly. Bowel sounds positive.  Musculoskeletal: no clubbing / cyanosis. No joint deformity upper and lower extremities. Skin: no rashes, lesions,  ulcers. No induration Neurologic: CN 2-12 grossly intact. Sensation intact, DTR normal. Strength 5/5 in all 4.  Psychiatric: Normal judgment and insight. Alert and oriented x 3. Normal mood.    Labs on Admission: I have personally reviewed following labs and imaging studies  CBC: Recent Labs  Lab 03/21/18 1014  WBC 9.6  HGB 14.0  HCT 41.1  MCV 93.8  PLT 202   Basic Metabolic Panel: Recent Labs  Lab 03/21/18 1014  NA 138  K 4.0  CL 108  CO2 23  GLUCOSE 101*  BUN 9    CREATININE 0.68  CALCIUM 8.5*   GFR: Estimated Creatinine Clearance: 147.9 mL/min (by C-G formula based on SCr of 0.68 mg/dL). Liver Function Tests: No results for input(s): AST, ALT, ALKPHOS, BILITOT, PROT, ALBUMIN in the last 168 hours. No results for input(s): LIPASE, AMYLASE in the last 168 hours. No results for input(s): AMMONIA in the last 168 hours. Coagulation Profile: No results for input(s): INR, PROTIME in the last 168 hours. Cardiac Enzymes: No results for input(s): CKTOTAL, CKMB, CKMBINDEX, TROPONINI in the last 168 hours. BNP (last 3 results) No results for input(s): PROBNP in the last 8760 hours. HbA1C: No results for input(s): HGBA1C in the last 72 hours. CBG: No results for input(s): GLUCAP in the last 168 hours. Lipid Profile: No results for input(s): CHOL, HDL, LDLCALC, TRIG, CHOLHDL, LDLDIRECT in the last 72 hours. Thyroid Function Tests: No results for input(s): TSH, T4TOTAL, FREET4, T3FREE, THYROIDAB in the last 72 hours. Anemia Panel: No results for input(s): VITAMINB12, FOLATE, FERRITIN, TIBC, IRON, RETICCTPCT in the last 72 hours. Urine analysis:    Component Value Date/Time   COLORURINE YELLOW 04/05/2017 1249   APPEARANCEUR CLEAR 04/05/2017 1249   LABSPEC 1.013 04/05/2017 1249   PHURINE 6.0 04/05/2017 1249   GLUCOSEU NEGATIVE 04/05/2017 1249   HGBUR NEGATIVE 04/05/2017 1249   BILIRUBINUR NEGATIVE 04/05/2017 1249   KETONESUR NEGATIVE 04/05/2017 1249   PROTEINUR NEGATIVE 04/05/2017 1249   UROBILINOGEN 1.0 05/28/2015 0643   NITRITE NEGATIVE 04/05/2017 1249   LEUKOCYTESUR NEGATIVE 04/05/2017 1249   Sepsis Labs: !!!!!!!!!!!!!!!!!!!!!!!!!!!!!!!!!!!!!!!!!!!! @LABRCNTIP (procalcitonin:4,lacticidven:4) )No results found for this or any previous visit (from the past 240 hour(s)).   Radiological Exams on Admission: Dg Chest 2 View  Result Date: 03/21/2018 CLINICAL DATA:  Acute onset of cough, chest congestion, shortness of breath and chills that began  yesterday. Current history of asthma. EXAM: CHEST - 2 VIEW COMPARISON:  07/22/2016, 05/05/2016 and earlier. FINDINGS: Suboptimal inspiration accounts for crowded bronchovascular markings, especially in the bases, and accentuates the cardiac silhouette. Taking this into account, cardiomediastinal silhouette unremarkable and unchanged. Lungs clear. Bronchovascular markings normal. Pulmonary vascularity normal. No visible pleural effusions. No pneumothorax. Degenerative changes involving the thoracic spine IMPRESSION: Suboptimal inspiration.  No acute cardiopulmonary disease. Electronically Signed   By: Hulan Saas M.D.   On: 03/21/2018 09:52   Ct Angio Chest Pe W/cm &/or Wo Cm  Result Date: 03/21/2018 CLINICAL DATA:  Constant chest pressure since waking up yesterday morning. Shortness of breath and productive cough with body aches and chills. EXAM: CT ANGIOGRAPHY CHEST WITH CONTRAST TECHNIQUE: Multidetector CT imaging of the chest was performed using the standard protocol during bolus administration of intravenous contrast. Multiplanar CT image reconstructions and MIPs were obtained to evaluate the vascular anatomy. CONTRAST:  ISOVUE-370 IOPAMIDOL (ISOVUE-370) INJECTION 76% COMPARISON:  Chest x-ray from same day. FINDINGS: Cardiovascular: Suboptimal opacification of the pulmonary arteries to the segmental level. Evaluation is also limited by motion artifact. No  evidence of central or definite lobar pulmonary embolism. Normal heart size. No pericardial effusion. Mediastinum/Nodes: No enlarged mediastinal, hilar, or axillary lymph nodes. Prominent right hilar lymph nodes measuring up to 9 mm are likely reactive. Thyroid gland, trachea, and esophagus demonstrate no significant findings. Lungs/Pleura: There are a few small ground-glass densities in the right upper lobe. Mild mosaic attenuation. Subsegmental atelectasis in the right middle lobe. No focal consolidation, pleural effusion, or pneumothorax. 5 mm  pulmonary nodule in the left lung apex (series 10, image 8). Upper Abdomen: No acute abnormality.  Prior cholecystectomy. Musculoskeletal: No chest wall abnormality. No acute or significant osseous findings. Mild degenerative changes throughout the thoracic spine. Review of the MIP images confirms the above findings. IMPRESSION: 1. Suboptimal study due to contrast bolus timing and motion artifact. No evidence of central or definite lobar pulmonary embolism. The segmental pulmonary arteries are not well evaluated. 2. A few small ground-glass densities in the right upper lobe are likely infectious or inflammatory. Reactive right hilar lymph nodes. 3. 5 mm pulmonary nodule in the left lung apex. No follow-up needed if patient is low-risk. Non-contrast chest CT can be considered in 12 months if patient is high-risk. This recommendation follows the consensus statement: Guidelines for Management of Incidental Pulmonary Nodules Detected on CT Images: From the Fleischner Society 2017; Radiology 2017; 284:228-243. Electronically Signed   By: Obie DredgeWilliam T Derry M.D.   On: 03/21/2018 11:52    Assessment/Plan: Acute bronchitis Will place in observation, I personally attempted to walk patient however after about 100 feet patient became very dyspneic and short of breath.  Subsequently felt slightly dizzy and with hot flashes.  Viral versus bacterial, obtain procalcitonin if elevated start antibiotics.  Viral panel pending. For now will place on prednisone 50 mg daily, nebulizer with Brovana, Pulmicort and duo nebs.  Continue supportive treatment.  Flutter valve ordered.  Elevated blood pressure without diagnosis of hypertension Could be related to respiratory distress, will hold on antihypertensive medication and monitor blood pressure closely.  If BP remains elevated may need to add antihypertensives.  Tobacco abuse Discussed cessation  Morbid obesity Encouraged lifestyle modification, patient might benefit from  bariatric surgery as an outpatient.  Pulmonary nodules 3.5 mm pulmonary nodule on the left lung apex.  Recommend to repeat CT scan in 12 months as patient is a smoker.     DVT prophylaxis: Lovenox Code Status: Full code Family Communication: None at bedside Disposition Plan: Anticipate discharge to previous home environment.  Consults called: None Admission status: Obs/MedSurg   Latrelle DodrillEdwin Silva MD [facility] Pager: Text Page via [email or web address]  [phone]  If 7PM-7AM, please contact night-coverage [email or web address] Password [email or web address]  03/21/2018, 6:15 PM

## 2018-03-21 NOTE — ED Provider Notes (Signed)
[facility]   CSN: [id number] Arrival date & time: 03/21/18  0909     History   Chief Complaint Chief Complaint  Patient presents with  . Chest Pain  . Shortness of Breath  . Cough    HPI Tiffany Conley is a 37 y.o. female.  37 year old female presents with 1 day history of sudden onset of shortness of breath.  Has had no productive cough as well as subjective fever and body aches.  Similar symptoms in the past associated with URI type symptoms.  She is had dyspnea on exertion but denies any orthopnea.  No new lower extremity edema.  Has used her home inhaler without relief.  No pleuritic component to this.  Denies any recent travel history.     Past Medical History:  Diagnosis Date  . Headache   . Sciatic nerve pain   . UTI (lower urinary tract infection)     Patient Active Problem List   Diagnosis Date Noted  . Acute lateral meniscus tear of left knee 08/23/2017  . Diarrhea 06/01/2017  . Nausea with vomiting 04/18/2017  . Low back pain 04/18/2017  . Occipital neuralgia of right side 04/27/2016  . Common migraine without intractability 04/27/2016  . Neck pain 04/27/2016  . 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 INTERNAL DERANGEMENT, RIGHT KNEE 09/08/2010  . Lateral meniscus tear 09/08/2010    Past Surgical History:  Procedure Laterality Date  . APPENDECTOMY    . CHOLECYSTECTOMY    . ENDOMETRIAL ABLATION    . KNEE ARTHROSCOPY    . SHOULDER SURGERY Left   . TONSILLECTOMY       OB History    Gravida  0   Para  0   Term  0   Preterm  0   AB  0   Living  0     SAB  0   TAB  0   Ectopic  0   Multiple  0   Live Births               Home Medications    Prior to Admission medications   Medication Sig Start Date End Date Taking? Authorizing Provider  albuterol (PROVENTIL HFA;VENTOLIN HFA) 108 (90 Base) MCG/ACT inhaler Inhale 2 puffs into the lungs every 4  (four) hours as needed for wheezing or shortness of breath. 07/22/16   Gilda CreasePollina, Christopher J, MD  cholestyramine light (PREVALITE) 4 g packet Take 1 packet (4 g total) by mouth daily. 08/01/17 08/31/17  Rachael FeeJacobs, Daniel P, MD  doxycycline (VIBRAMYCIN) 100 MG capsule Take 1 capsule (100 mg total) by mouth 2 (two) times daily. 11/29/17   Rancour, Jeannett SeniorStephen, MD  naproxen (NAPROSYN) 500 MG tablet Take 1 tablet (500 mg total) by mouth 2 (two) times daily with a meal. 04/01/17   Everlene Farrieransie, William, PA-C  traMADol (ULTRAM) 50 MG tablet Take 1 tablet (50 mg total) by mouth every 12 (twelve) hours as needed. 09/01/17   Tarry KosXu, Naiping M, MD    Family History Family History  Problem Relation Age of Onset  . Colon cancer Maternal Grandmother   . Healthy Mother   . Congestive Heart Failure Father     Social History Social History   Tobacco Use  . Smoking status: Current Every Day Smoker    Packs/day: 0.50    Years: 10.00    Pack years: 5.00    Types: Cigarettes  . Smokeless tobacco: Never Used  Substance Use  Topics  . Alcohol use: No  . Drug use: Yes    Frequency: 7.0 times per week    Types: Marijuana     Allergies   Citrus; Ibuprofen; and Vicodin [hydrocodone-acetaminophen]   Review of Systems Review of Systems  All other systems reviewed and are negative.    Physical Exam Updated Vital Signs BP (!) 154/87 (BP Location: Right Arm)   Pulse (!) 108   Temp 99.1 F (37.3 C) (Oral)   Resp 14   Ht 1.676 m (5\' 6" )   Wt (!) 154.2 kg (340 lb)   LMP 02/19/2018 (Approximate)   SpO2 100%   BMI 54.88 kg/m   Physical Exam  Constitutional: She is oriented to person, place, and time. She appears well-developed and well-nourished.  Non-toxic appearance. No distress.  HENT:  Head: Normocephalic and atraumatic.  Eyes: Pupils are equal, round, and reactive to light. Conjunctivae, EOM and lids are normal.  Neck: Normal range of motion. Neck supple. No tracheal deviation present. No thyroid mass  present.  Cardiovascular: Normal rate, regular rhythm and normal heart sounds. Exam reveals no gallop.  No murmur heard. Pulmonary/Chest: Effort normal. No stridor. No respiratory distress. She has decreased breath sounds in the right lower field and the left lower field. She has wheezes in the right lower field and the left lower field. She has no rhonchi. She has no rales.  Abdominal: Soft. Normal appearance and bowel sounds are normal. She exhibits no distension. There is no tenderness. There is no rebound and no CVA tenderness.  Musculoskeletal: Normal range of motion. She exhibits no edema or tenderness.  Neurological: She is alert and oriented to person, place, and time. She has normal strength. No cranial nerve deficit or sensory deficit. GCS eye subscore is 4. GCS verbal subscore is 5. GCS motor subscore is 6.  Skin: Skin is warm and dry. No abrasion and no rash noted.  Psychiatric: She has a normal mood and affect. Her speech is normal and behavior is normal.  Nursing note and vitals reviewed.    ED Treatments / Results  Labs (all labs ordered are listed, but only abnormal results are displayed) Labs Reviewed  BASIC METABOLIC PANEL  CBC  I-STAT TROPONIN, ED  I-STAT BETA HCG BLOOD, ED ([facility], [facility], [facility] ONLY)    EKG None  Radiology Dg Chest 2 View  Result Date: 03/21/2018 CLINICAL DATA:  Acute onset of cough, chest congestion, shortness of breath and chills that began yesterday. Current history of asthma. EXAM: CHEST - 2 VIEW COMPARISON:  07/22/2016, 05/05/2016 and earlier. FINDINGS: Suboptimal inspiration accounts for crowded bronchovascular markings, especially in the bases, and accentuates the cardiac silhouette. Taking this into account, cardiomediastinal silhouette unremarkable and unchanged. Lungs clear. Bronchovascular markings normal. Pulmonary vascularity normal. No visible pleural effusions. No pneumothorax. Degenerative changes involving the thoracic spine IMPRESSION:  Suboptimal inspiration.  No acute cardiopulmonary disease. Electronically Signed   By: Hulan Saas M.D.   On: 03/21/2018 09:52    Procedures Procedures (including critical care time)  Medications Ordered in ED Medications  0.9 %  sodium chloride infusion (has no administration in time range)  albuterol (PROVENTIL) (2.5 MG/3ML) 0.083% nebulizer solution 5 mg (has no administration in time range)  ipratropium (ATROVENT) nebulizer solution 0.5 mg (has no administration in time range)  methylPREDNISolone sodium succinate (SOLU-MEDROL) 125 mg/2 mL injection 125 mg (has no administration in time range)     Initial Impression / Assessment and Plan / ED Course  I have reviewed  the triage vital signs and the nursing notes.  Pertinent labs & imaging results that were available during my care of the patient were reviewed by me and considered in my medical decision making (see chart for details).     Pt here with sob and cough Concern for PE and chest ct neg for embolus Pt given albuterol and solumedrol Pt ambulated in depatment and remains dyspneic Will admit for tx of likely bronchitis   Final Clinical Impressions(s) / ED Diagnoses   Final diagnoses:  None    ED Discharge Orders    None       Lorre Nick, MD 03/21/18 1542

## 2018-03-21 NOTE — ED Notes (Signed)
ED TO INPATIENT HANDOFF REPORT  Name/Age/Gender Tiffany Conley 37 y.o. female  Code Status Advance Directive Documentation     Most Recent Value  Type of Advance Directive  Healthcare Power of Attorney  Pre-existing out of facility DNR order (yellow form or pink MOST form)  -  "MOST" Form in Place?  -      Home/SNF/Other Home  Chief Complaint SOB; Chest Pain  Level of Care/Admitting Diagnosis ED Disposition    ED Disposition Condition Comment   Admit  Hospital Area: [facility] [[id number]]  Level of Care: Med-Surg [16]  Diagnosis: Bronchitis [956213]  Admitting Physician: Patrecia Pour, EDWIN [[id number]]  Attending Physician: Patrecia Pour, EDWIN [[id number]]  PT Class (Do Not Modify): Observation [104]  PT Acc Code (Do Not Modify): Observation [10022]       Medical History Past Medical History:  Diagnosis Date  . Headache   . Sciatic nerve pain   . UTI (lower urinary tract infection)     Allergies Allergies  Allergen Reactions  . Citrus Itching and Rash  . Ibuprofen Nausea Only  . Vicodin [Hydrocodone-Acetaminophen] Nausea And Vomiting    IV Location/Drains/Wounds Patient Lines/Drains/Airways Status   Active Line/Drains/Airways    Name:   Placement date:   Placement time:   Site:   Days:   Peripheral IV 03/21/18 Antecubital   03/21/18    1016    Antecubital   less than 1          Labs/Imaging Results for orders placed or performed during the hospital encounter of 03/21/18 (from the past 48 hour(s))  Basic metabolic panel     Status: Abnormal   Collection Time: 03/21/18 10:14 AM  Result Value Ref Range   Sodium 138 135 - 145 mmol/L   Potassium 4.0 3.5 - 5.1 mmol/L   Chloride 108 98 - 111 mmol/L    Comment: Please note change in reference range.   CO2 23 22 - 32 mmol/L   Glucose, Bld 101 (H) 70 - 99 mg/dL    Comment: Please note change in reference range.   BUN 9 6 - 20 mg/dL    Comment: Please note change in reference range.   Creatinine, Ser 0.68 0.44 - 1.00 mg/dL   Calcium 8.5 (L) 8.9 - 10.3 mg/dL   GFR calc non Af Amer >60 >60 mL/min   GFR calc Af Amer >60 >60 mL/min    Comment: (NOTE) The eGFR has been calculated using the CKD EPI equation. This calculation has not been validated in all clinical situations. eGFR's persistently <60 mL/min signify possible Chronic Kidney Disease.    Anion gap 7 5 - 15    Comment: Performed at [facility], [place] [address] [place], [place] [address]  CBC     Status: None   Collection Time: 03/21/18 10:14 AM  Result Value Ref Range   WBC 9.6 4.0 - 10.5 K/uL   RBC 4.38 3.87 - 5.11 MIL/uL   Hemoglobin 14.0 12.0 - 15.0 g/dL   HCT 41.1 36.0 - 46.0 %   MCV 93.8 78.0 - 100.0 fL   MCH 32.0 26.0 - 34.0 pg   MCHC 34.1 30.0 - 36.0 g/dL   RDW 13.4 11.5 - 15.5 %   Platelets 202 150 - 400 K/uL    Comment: Performed at [facility], [place] [address], [place], [place] [address]  I-stat troponin, ED     Status: None   Collection Time: 03/21/18 10:23 AM  Result  Value Ref Range   Troponin i, poc 0.03 0.00 - 0.08 ng/mL   Comment 3            Comment: Due to the release kinetics of cTnI, a negative result within the first hours of the onset of symptoms does not rule out myocardial infarction with certainty. If myocardial infarction is still suspected, repeat the test at appropriate intervals.   I-Stat beta hCG blood, ED     Status: None   Collection Time: 03/21/18 10:23 AM  Result Value Ref Range   I-stat hCG, quantitative <5.0 <5 mIU/mL   Comment 3            Comment:   GEST. AGE      CONC.  (mIU/mL)   <=1 WEEK        5 - 50     2 WEEKS       50 - 500     3 WEEKS       100 - 10,000     4 WEEKS     1,000 - 30,000        FEMALE AND NON-PREGNANT FEMALE:     LESS THAN 5 mIU/mL    Dg Chest 2 View  Result Date: 03/21/2018 CLINICAL DATA:  Acute onset of cough, chest congestion, shortness of breath and chills that began yesterday. Current  history of asthma. EXAM: CHEST - 2 VIEW COMPARISON:  07/22/2016, 05/05/2016 and earlier. FINDINGS: Suboptimal inspiration accounts for crowded bronchovascular markings, especially in the bases, and accentuates the cardiac silhouette. Taking this into account, cardiomediastinal silhouette unremarkable and unchanged. Lungs clear. Bronchovascular markings normal. Pulmonary vascularity normal. No visible pleural effusions. No pneumothorax. Degenerative changes involving the thoracic spine IMPRESSION: Suboptimal inspiration.  No acute cardiopulmonary disease. Electronically Signed   By: Evangeline Dakin M.D.   On: 03/21/2018 09:52   Ct Angio Chest Pe W/cm &/or Wo Cm  Result Date: 03/21/2018 CLINICAL DATA:  Constant chest pressure since waking up yesterday morning. Shortness of breath and productive cough with body aches and chills. EXAM: CT ANGIOGRAPHY CHEST WITH CONTRAST TECHNIQUE: Multidetector CT imaging of the chest was performed using the standard protocol during bolus administration of intravenous contrast. Multiplanar CT image reconstructions and MIPs were obtained to evaluate the vascular anatomy. CONTRAST:  137m ISOVUE-370 IOPAMIDOL (ISOVUE-370) INJECTION 76% COMPARISON:  Chest x-ray from same day. FINDINGS: Cardiovascular: Suboptimal opacification of the pulmonary arteries to the segmental level. Evaluation is also limited by motion artifact. No evidence of central or definite lobar pulmonary embolism. Normal heart size. No pericardial effusion. Mediastinum/Nodes: No enlarged mediastinal, hilar, or axillary lymph nodes. Prominent right hilar lymph nodes measuring up to 9 mm are likely reactive. Thyroid gland, trachea, and esophagus demonstrate no significant findings. Lungs/Pleura: There are a few small ground-glass densities in the right upper lobe. Mild mosaic attenuation. Subsegmental atelectasis in the right middle lobe. No focal consolidation, pleural effusion, or pneumothorax. 5 mm pulmonary nodule  in the left lung apex (series 10, image 8). Upper Abdomen: No acute abnormality.  Prior cholecystectomy. Musculoskeletal: No chest wall abnormality. No acute or significant osseous findings. Mild degenerative changes throughout the thoracic spine. Review of the MIP images confirms the above findings. IMPRESSION: 1. Suboptimal study due to contrast bolus timing and motion artifact. No evidence of central or definite lobar pulmonary embolism. The segmental pulmonary arteries are not well evaluated. 2. A few small ground-glass densities in the right upper lobe are likely infectious or inflammatory. Reactive right hilar lymph nodes.  3. 5 mm pulmonary nodule in the left lung apex. No follow-up needed if patient is low-risk. Non-contrast chest CT can be considered in 12 months if patient is high-risk. This recommendation follows the consensus statement: Guidelines for Management of Incidental Pulmonary Nodules Detected on CT Images: From the Fleischner Society 2017; Radiology 2017; 284:228-243. Electronically Signed   By: Titus Dubin M.D.   On: 03/21/2018 11:52    Pending Labs Unresulted Labs (From admission, onward)   Start     Ordered   03/21/18 1554  Respiratory Panel by PCR  (Respiratory virus panel)  Once,   R     03/21/18 1554      Vitals/Pain Today's Vitals   03/21/18 1000 03/21/18 1059 03/21/18 1352 03/21/18 1616  BP:  (!) 154/83  (!) 156/90  Pulse:  95  (!) 102  Resp:  20  16  Temp:  98.3 F (36.8 C)    TempSrc:      SpO2:  100% 98% 100%  Weight:      Height:      PainSc: 3        Isolation Precautions Droplet precaution  Medications Medications  0.9 %  sodium chloride infusion ( Intravenous New Bag/Given 03/21/18 1024)  albuterol (PROVENTIL) (2.5 MG/3ML) 0.083% nebulizer solution 5 mg (5 mg Nebulization Given 03/21/18 1025)  ipratropium (ATROVENT) nebulizer solution 0.5 mg (0.5 mg Nebulization Given 03/21/18 1025)  methylPREDNISolone sodium succinate (SOLU-MEDROL) 125 mg/2 mL  injection 125 mg (125 mg Intravenous Given 03/21/18 1024)  albuterol (PROVENTIL) (2.5 MG/3ML) 0.083% nebulizer solution (  Given 03/21/18 1047)  iopamidol (ISOVUE-370) 76 % injection 100 mL (100 mLs Intravenous Contrast Given 03/21/18 1122)  iopamidol (ISOVUE-370) 76 % injection (  Contrast Given 03/21/18 1223)  albuterol (PROVENTIL,VENTOLIN) solution continuous neb (10 mg/hr Nebulization Given 03/21/18 1352)    Mobility walks

## 2018-03-21 NOTE — ED Notes (Signed)
Respiratory called for this patient. They will be down as soon as they can.

## 2018-03-22 DIAGNOSIS — R0600 Dyspnea, unspecified: Secondary | ICD-10-CM

## 2018-03-22 DIAGNOSIS — J40 Bronchitis, not specified as acute or chronic: Secondary | ICD-10-CM

## 2018-03-22 DIAGNOSIS — R0609 Other forms of dyspnea: Secondary | ICD-10-CM

## 2018-03-22 LAB — COMPREHENSIVE METABOLIC PANEL
ALBUMIN: 3.3 g/dL — AB (ref 3.5–5.0)
ALK PHOS: 55 U/L (ref 38–126)
ALT: 26 U/L (ref 0–44)
AST: 20 U/L (ref 15–41)
Anion gap: 9 (ref 5–15)
BUN: 12 mg/dL (ref 6–20)
CALCIUM: 9.2 mg/dL (ref 8.9–10.3)
CO2: 22 mmol/L (ref 22–32)
CREATININE: 0.6 mg/dL (ref 0.44–1.00)
Chloride: 109 mmol/L (ref 98–111)
GFR calc Af Amer: >60 mL/min (ref >60)
GFR calc non Af Amer: >60 mL/min (ref >60)
GLUCOSE: 136 mg/dL — AB (ref 70–99)
Potassium: 4.1 mmol/L (ref 3.5–5.1)
SODIUM: 140 mmol/L (ref 135–145)
Total Bilirubin: 0.4 mg/dL (ref 0.3–1.2)
Total Protein: 6.6 g/dL (ref 6.5–8.1)

## 2018-03-22 LAB — RESPIRATORY PANEL BY PCR
Adenovirus: NOT DETECTED
BORDETELLA PERTUSSIS-RVPCR: NOT DETECTED
CHLAMYDOPHILA PNEUMONIAE-RVPPCR: NOT DETECTED
CORONAVIRUS 229E-RVPPCR: NOT DETECTED
CORONAVIRUS HKU1-RVPPCR: NOT DETECTED
Coronavirus NL63: NOT DETECTED
Coronavirus OC43: NOT DETECTED
INFLUENZA B-RVPPCR: NOT DETECTED
Influenza A: NOT DETECTED
MYCOPLASMA PNEUMONIAE-RVPPCR: NOT DETECTED
Metapneumovirus: NOT DETECTED
PARAINFLUENZA VIRUS 2-RVPPCR: NOT DETECTED
Parainfluenza Virus 1: NOT DETECTED
Parainfluenza Virus 3: NOT DETECTED
Parainfluenza Virus 4: NOT DETECTED
RESPIRATORY SYNCYTIAL VIRUS-RVPPCR: NOT DETECTED
Rhinovirus / Enterovirus: DETECTED — AB

## 2018-03-22 LAB — CBC
HEMATOCRIT: 40.2 % (ref 36.0–46.0)
Hemoglobin: 13.5 g/dL (ref 12.0–15.0)
MCH: 31.5 pg (ref 26.0–34.0)
MCHC: 33.6 g/dL (ref 30.0–36.0)
MCV: 93.7 fL (ref 78.0–100.0)
Platelets: 243 10*3/uL (ref 150–400)
RBC: 4.29 MIL/uL (ref 3.87–5.11)
RDW: 13.6 % (ref 11.5–15.5)
WBC: 11.5 10*3/uL — ABNORMAL HIGH (ref 4.0–10.5)

## 2018-03-22 MED ORDER — PREDNISONE 20 MG PO TABS: ORAL_TABLET | ORAL | 0 refills | Status: DC

## 2018-03-22 MED ORDER — FLUCONAZOLE 100 MG PO TABS: 100.0000 mg | ORAL_TABLET | Freq: Every day | ORAL | 0 refills | Status: AC

## 2018-03-22 NOTE — Progress Notes (Signed)
Discharge instructions reviewed with patient. All questions answered. Patient wheeled to vehicle with belongings by nurse tech. 

## 2018-03-22 NOTE — Discharge Summary (Addendum)
Physician Discharge Summary  Tiffany Conley WUJ:[id number] DOB: 23-Sep-1980 DOA: 03/21/2018  PCP: Veryl Speak, FNP  Admit date: 03/21/2018 Discharge date: 03/22/2018  Admitted From home  Disposition: Home  Recommendations for Outpatient Follow-up:  1. Follow up with PCP in 1-2 weeks 2. Please obtain BMP/CBC in one week  Home Health none Equipment/Devices: None Discharge Condition: Stable CODE STATUS: Full code Diet recommendation: Cardiac Brief/Interim Summary:37 y.o. female with no significant medical history presented to the emergency department complaining of shortness of breath associated with cough and chest discomfort.  Patientstated symptoms started suddenly 1 day prior to admission.  Associated symptoms include suggestive fever and body aches.  Also reports dyspnea on exertion, however denies orthopnea and lower extremity edema.  Patient used her home inhaler with no relief.  Patient denies any sick contacts or recent travel.  Patient is active smoker.  ED Course: Upon ED evaluation patient was found to be tachycardic, dyspneic but not hypoxic, she was given albuterol and Solu-Medrol with some improvement.  CT of the chest was performed due to concerns of PE however this was negative for blood clot. 5 mm pulmonary nodule noted on left lung apex. ED physician attempted to discharge patient however she was unable to ambulate without having severe dyspnea.     Discharge Diagnoses:  Active Problems:   Bronchitis 1] acute bronchitis improved with p.o. steroids and inhalers and nebulizing treatments.  She ambulated prior to discharge her saturation was above 98% on room air and after ambulation.  She was able to be discharged home.  I will discharge her on prednisone taper and inhalers she was taking at home.  Patient continues to smoke discussed about smoking cessation.  I have also given her a prescription for Diflucan for 3 days as patient complains of having pressure every  time she goes on steroids.  2] pulmonary nodules 3.5 mm nodules in the left lung apex follow-up CT scan in 12 months patient is a smoker.  Discharge Instructions  Discharge Instructions    Call MD for:  difficulty breathing, headache or visual disturbances   Complete by:  As directed    Call MD for:  persistant nausea and vomiting   Complete by:  As directed    Call MD for:  severe uncontrolled pain   Complete by:  As directed    Diet - low sodium heart healthy   Complete by:  As directed    Increase activity slowly   Complete by:  As directed      Allergies as of 03/22/2018      Reactions   Citrus Itching, Rash   Ibuprofen Nausea Only   Vicodin [hydrocodone-acetaminophen] Nausea And Vomiting      Medication List    TAKE these medications   albuterol 108 (90 Base) MCG/ACT inhaler Commonly known as:  PROVENTIL HFA;VENTOLIN HFA Inhale 2 puffs into the lungs every 4 (four) hours as needed for wheezing or shortness of breath.   predniSONE 20 MG tablet Commonly known as:  DELTASONE Take 40 mg for 3 days then 30 mg for next  3 days and 20 mg for next 3 days and 10 mg till done      Follow-up Information    Veryl Speak, FNP Follow up.   Specialties:  Family Medicine, Infectious Diseases Contact information: [address] [place] [address] [phone]          Allergies  Allergen Reactions  . Citrus Itching and Rash  . Ibuprofen Nausea  Only  . Vicodin [Hydrocodone-Acetaminophen] Nausea And Vomiting    Consultations:     Procedures/Studies: Dg Chest 2 View  Result Date: 03/21/2018 CLINICAL DATA:  Acute onset of cough, chest congestion, shortness of breath and chills that began yesterday. Current history of asthma. EXAM: CHEST - 2 VIEW COMPARISON:  07/22/2016, 05/05/2016 and earlier. FINDINGS: Suboptimal inspiration accounts for crowded bronchovascular markings, especially in the bases, and accentuates the cardiac silhouette. Taking this into account,  cardiomediastinal silhouette unremarkable and unchanged. Lungs clear. Bronchovascular markings normal. Pulmonary vascularity normal. No visible pleural effusions. No pneumothorax. Degenerative changes involving the thoracic spine IMPRESSION: Suboptimal inspiration.  No acute cardiopulmonary disease. Electronically Signed   By: Hulan Saashomas  Lawrence M.D.   On: 03/21/2018 09:52   Ct Angio Chest Pe W/cm &/or Wo Cm  Result Date: 03/21/2018 CLINICAL DATA:  Constant chest pressure since waking up yesterday morning. Shortness of breath and productive cough with body aches and chills. EXAM: CT ANGIOGRAPHY CHEST WITH CONTRAST TECHNIQUE: Multidetector CT imaging of the chest was performed using the standard protocol during bolus administration of intravenous contrast. Multiplanar CT image reconstructions and MIPs were obtained to evaluate the vascular anatomy. CONTRAST:  100mL ISOVUE-370 IOPAMIDOL (ISOVUE-370) INJECTION 76% COMPARISON:  Chest x-ray from same day. FINDINGS: Cardiovascular: Suboptimal opacification of the pulmonary arteries to the segmental level. Evaluation is also limited by motion artifact. No evidence of central or definite lobar pulmonary embolism. Normal heart size. No pericardial effusion. Mediastinum/Nodes: No enlarged mediastinal, hilar, or axillary lymph nodes. Prominent right hilar lymph nodes measuring up to 9 mm are likely reactive. Thyroid gland, trachea, and esophagus demonstrate no significant findings. Lungs/Pleura: There are a few small ground-glass densities in the right upper lobe. Mild mosaic attenuation. Subsegmental atelectasis in the right middle lobe. No focal consolidation, pleural effusion, or pneumothorax. 5 mm pulmonary nodule in the left lung apex (series 10, image 8). Upper Abdomen: No acute abnormality.  Prior cholecystectomy. Musculoskeletal: No chest wall abnormality. No acute or significant osseous findings. Mild degenerative changes throughout the thoracic spine. Review of the  MIP images confirms the above findings. IMPRESSION: 1. Suboptimal study due to contrast bolus timing and motion artifact. No evidence of central or definite lobar pulmonary embolism. The segmental pulmonary arteries are not well evaluated. 2. A few small ground-glass densities in the right upper lobe are likely infectious or inflammatory. Reactive right hilar lymph nodes. 3. 5 mm pulmonary nodule in the left lung apex. No follow-up needed if patient is low-risk. Non-contrast chest CT can be considered in 12 months if patient is high-risk. This recommendation follows the consensus statement: Guidelines for Management of Incidental Pulmonary Nodules Detected on CT Images: From the Fleischner Society 2017; Radiology 2017; 284:228-243. Electronically Signed   By: Obie DredgeWilliam T Derry M.D.   On: 03/21/2018 11:52    (Echo, Carotid, EGD, Colonoscopy, ERCP)    Subjective:   Discharge Exam: Vitals:   03/22/18 0447 03/22/18 0938  BP: 138/71   Pulse: 75   Resp: 18   Temp: 98.9 F (37.2 C)   SpO2: 94% 99%   Vitals:   03/21/18 2202 03/21/18 2203 03/22/18 0447 03/22/18 0938  BP:   138/71   Pulse:   75   Resp:   18   Temp:   98.9 F (37.2 C)   TempSrc:   Oral   SpO2: 99% 99% 94% 99%  Weight:      Height:        General: Pt is alert, awake, not in acute distress  Cardiovascular: RRR, S1/S2 +, no rubs, no gallops Respiratory: CTA bilaterally, no wheezing, no rhonchi Abdominal: Soft, NT, ND, bowel sounds + Extremities: no edema, no cyanosis    The results of significant diagnostics from this hospitalization (including imaging, microbiology, ancillary and laboratory) are listed below for reference.     Microbiology: Recent Results (from the past 240 hour(s))  Respiratory Panel by PCR     Status: Abnormal   Collection Time: 03/21/18 10:36 PM  Result Value Ref Range Status   Adenovirus NOT DETECTED NOT DETECTED Final   Coronavirus 229E NOT DETECTED NOT DETECTED Final   Coronavirus HKU1 NOT  DETECTED NOT DETECTED Final   Coronavirus NL63 NOT DETECTED NOT DETECTED Final   Coronavirus OC43 NOT DETECTED NOT DETECTED Final   Metapneumovirus NOT DETECTED NOT DETECTED Final   Rhinovirus / Enterovirus DETECTED (A) NOT DETECTED Final   Influenza A NOT DETECTED NOT DETECTED Final   Influenza B NOT DETECTED NOT DETECTED Final   Parainfluenza Virus 1 NOT DETECTED NOT DETECTED Final   Parainfluenza Virus 2 NOT DETECTED NOT DETECTED Final   Parainfluenza Virus 3 NOT DETECTED NOT DETECTED Final   Parainfluenza Virus 4 NOT DETECTED NOT DETECTED Final   Respiratory Syncytial Virus NOT DETECTED NOT DETECTED Final   Bordetella pertussis NOT DETECTED NOT DETECTED Final   Chlamydophila pneumoniae NOT DETECTED NOT DETECTED Final   Mycoplasma pneumoniae NOT DETECTED NOT DETECTED Final    Comment: Performed at [facility], [address] [place], [place] [address]     Labs: BNP (last 3 results) No results for input(s): BNP in the last 8760 hours. Basic Metabolic Panel: Recent Labs  Lab 03/21/18 1014 03/22/18 0437  NA 138 140  K 4.0 4.1  CL 108 109  CO2 23 22  GLUCOSE 101* 136*  BUN 9 12  CREATININE 0.68 0.60  CALCIUM 8.5* 9.2   Liver Function Tests: Recent Labs  Lab 03/22/18 0437  AST 20  ALT 26  ALKPHOS 55  BILITOT 0.4  PROT 6.6  ALBUMIN 3.3*   No results for input(s): LIPASE, AMYLASE in the last 168 hours. No results for input(s): AMMONIA in the last 168 hours. CBC: Recent Labs  Lab 03/21/18 1014 03/22/18 0437  WBC 9.6 11.5*  HGB 14.0 13.5  HCT 41.1 40.2  MCV 93.8 93.7  PLT 202 243   Cardiac Enzymes: No results for input(s): CKTOTAL, CKMB, CKMBINDEX, TROPONINI in the last 168 hours. BNP: Invalid input(s): POCBNP CBG: No results for input(s): GLUCAP in the last 168 hours. D-Dimer No results for input(s): DDIMER in the last 72 hours. Hgb A1c No results for input(s): HGBA1C in the last 72 hours. Lipid Profile No results for input(s): CHOL, HDL,  LDLCALC, TRIG, CHOLHDL, LDLDIRECT in the last 72 hours. Thyroid function studies No results for input(s): TSH, T4TOTAL, T3FREE, THYROIDAB in the last 72 hours.  Invalid input(s): FREET3 Anemia work up No results for input(s): VITAMINB12, FOLATE, FERRITIN, TIBC, IRON, RETICCTPCT in the last 72 hours. Urinalysis    Component Value Date/Time   COLORURINE YELLOW 04/05/2017 1249   APPEARANCEUR CLEAR 04/05/2017 1249   LABSPEC 1.013 04/05/2017 1249   PHURINE 6.0 04/05/2017 1249   GLUCOSEU NEGATIVE 04/05/2017 1249   HGBUR NEGATIVE 04/05/2017 1249   BILIRUBINUR NEGATIVE 04/05/2017 1249   KETONESUR NEGATIVE 04/05/2017 1249   PROTEINUR NEGATIVE 04/05/2017 1249   UROBILINOGEN 1.0 05/28/2015 0643   NITRITE NEGATIVE 04/05/2017 1249   LEUKOCYTESUR NEGATIVE 04/05/2017 1249   Sepsis Labs Invalid input(s): PROCALCITONIN,  WBC,  LACTICIDVEN Microbiology Recent Results (from the past 240 hour(s))  Respiratory Panel by PCR     Status: Abnormal   Collection Time: 03/21/18 10:36 PM  Result Value Ref Range Status   Adenovirus NOT DETECTED NOT DETECTED Final   Coronavirus 229E NOT DETECTED NOT DETECTED Final   Coronavirus HKU1 NOT DETECTED NOT DETECTED Final   Coronavirus NL63 NOT DETECTED NOT DETECTED Final   Coronavirus OC43 NOT DETECTED NOT DETECTED Final   Metapneumovirus NOT DETECTED NOT DETECTED Final   Rhinovirus / Enterovirus DETECTED (A) NOT DETECTED Final   Influenza A NOT DETECTED NOT DETECTED Final   Influenza B NOT DETECTED NOT DETECTED Final   Parainfluenza Virus 1 NOT DETECTED NOT DETECTED Final   Parainfluenza Virus 2 NOT DETECTED NOT DETECTED Final   Parainfluenza Virus 3 NOT DETECTED NOT DETECTED Final   Parainfluenza Virus 4 NOT DETECTED NOT DETECTED Final   Respiratory Syncytial Virus NOT DETECTED NOT DETECTED Final   Bordetella pertussis NOT DETECTED NOT DETECTED Final   Chlamydophila pneumoniae NOT DETECTED NOT DETECTED Final   Mycoplasma pneumoniae NOT DETECTED NOT  DETECTED Final    Comment: Performed at [facility], [address] [place], [place] [address]     Time coordinating discharge: 34 minutes  SIGNED:   Alwyn Ren, MD  [facility] 03/22/2018, 1:02 PM Pager   If 7PM-7AM, please contact night-coverage [email or web address] Password [email or web address]

## 2018-03-22 NOTE — Progress Notes (Signed)
SATURATION QUALIFICATIONS: (This note is used to comply with regulatory documentation for home oxygen)  Patient Saturations on Room Air at Rest = 100%  Patient Saturations on Room Air while Ambulating = 100%  Patient Saturations on 0 Liters of oxygen while Ambulating = 100%  Please briefly explain why patient needs home oxygen: N/A  

## 2018-03-27 ENCOUNTER — Encounter: Payer: Self-pay | Admitting: Family

## 2018-03-27 ENCOUNTER — Ambulatory Visit: Payer: Self-pay

## 2018-03-27 ENCOUNTER — Ambulatory Visit (INDEPENDENT_AMBULATORY_CARE_PROVIDER_SITE_OTHER): Payer: 59 | Admitting: Family

## 2018-03-27 ENCOUNTER — Ambulatory Visit (INDEPENDENT_AMBULATORY_CARE_PROVIDER_SITE_OTHER)
Admission: RE | Admit: 2018-03-27 | Discharge: 2018-03-27 | Disposition: A | Discharge status: Home / Self Care | Payer: 59 | Source: Ambulatory Visit | Attending: Family | Admitting: Family

## 2018-03-27 VITALS — BP 138/90 | HR 83 | Temp 99.1°F | Ht 66.0 in | Wt 337.0 lb

## 2018-03-27 DIAGNOSIS — R05 Cough: Secondary | ICD-10-CM

## 2018-03-27 DIAGNOSIS — R059 Cough, unspecified: Secondary | ICD-10-CM

## 2018-03-27 DIAGNOSIS — J209 Acute bronchitis, unspecified: Secondary | ICD-10-CM

## 2018-03-27 MED ORDER — IPRATROPIUM-ALBUTEROL 0.5-2.5 (3) MG/3ML IN SOLN
3.0000 mL | Freq: Once | RESPIRATORY_TRACT | Status: AC
  Administered 2018-03-27: 3 mL via RESPIRATORY_TRACT

## 2018-03-27 MED ORDER — AMOXICILLIN-POT CLAVULANATE 875-125 MG PO TABS: 1.0000 | ORAL_TABLET | Freq: Two times a day (BID) | ORAL | 0 refills | Status: DC

## 2018-03-27 NOTE — Telephone Encounter (Signed)
Pt. Reports she was in the hospital with bronchitis July 16. States "I'm just not getting better." Still has a cough and shortness of breath with exertion. Has not been able to return to work. Denies any heart or lung history. Appointment made for today with Tiffany Conley. Instructed if her symptoms worsen to go to ED. Verbalizes understanding.  Reason for Disposition . [1] MODERATE longstanding difficulty breathing (e.g., speaks in phrases, SOB even at rest, pulse 100-120) AND [2] SAME as normal  Answer Assessment - Initial Assessment Questions 1. RESPIRATORY STATUS: "Describe your breathing?" (e.g., wheezing, shortness of breath, unable to speak, severe coughing)      Shortness of breath with exertion 2. ONSET: "When did this breathing problem begin?"      July 16 - in admission 3. PATTERN "Does the difficult breathing come and go, or has it been constant since it started?"      Constant 4. SEVERITY: "How bad is your breathing?" (e.g., mild, moderate, severe)    - MILD: No SOB at rest, mild SOB with walking, speaks normally in sentences, can lay down, no retractions, pulse < 100.    - MODERATE: SOB at rest, SOB with minimal exertion and prefers to sit, cannot lie down flat, speaks in phrases, mild retractions, audible wheezing, pulse 100-120.    - SEVERE: Very SOB at rest, speaks in single words, struggling to breathe, sitting hunched forward, retractions, pulse > 120      Moderste 5. RECURRENT SYMPTOM: "Have you had difficulty breathing before?" If so, ask: "When was the last time?" and "What happened that time?"      I have bronchitis 6. CARDIAC HISTORY: "Do you have any history of heart disease?" (e.g., heart attack, angina, bypass surgery, angioplasty)      No 7. LUNG HISTORY: "Do you have any history of lung disease?"  (e.g., pulmonary embolus, asthma, emphysema)     No 8. CAUSE: "What do you think is causing the breathing problem?"      Bronchitis 9. OTHER SYMPTOMS: "Do you have any  other symptoms? (e.g., dizziness, runny nose, cough, chest pain, fever)     Runny nose, sore throat, feels feverish 10. PREGNANCY: "Is there any chance you are pregnant?" "When was your last menstrual period?"       No 11. TRAVEL: "Have you traveled out of the country in the last month?" (e.g., travel history, exposures)       No  Protocols used: BREATHING DIFFICULTY-A-AH

## 2018-03-27 NOTE — Progress Notes (Signed)
Tiffany Conley is a 37 y.o. female with the following history as recorded in EpicCare:  Patient Active Problem List   Diagnosis Date Noted  . Dyspnea   . Bronchitis 03/21/2018  . Acute lateral meniscus tear of left knee 08/23/2017  . Diarrhea 06/01/2017  . Nausea with vomiting 04/18/2017  . Low back pain 04/18/2017  . Occipital neuralgia of right side 04/27/2016  . Common migraine without intractability 04/27/2016  . Neck pain 04/27/2016  . 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 INTERNAL DERANGEMENT, RIGHT KNEE 09/08/2010  . Lateral meniscus tear 09/08/2010    Current Outpatient Medications  Medication Sig Dispense Refill  . albuterol (PROVENTIL HFA;VENTOLIN HFA) 108 (90 Base) MCG/ACT inhaler Inhale 2 puffs into the lungs every 4 (four) hours as needed for wheezing or shortness of breath. 1 Inhaler 2  . amoxicillin-clavulanate (AUGMENTIN) 875-125 MG tablet Take 1 tablet by mouth 2 (two) times daily. 20 tablet 0  . predniSONE (DELTASONE) 20 MG tablet Take 40 mg for 3 days then 30 mg for next  3 days and 20 mg for next 3 days and 10 mg till done 30 tablet 0   No current facility-administered medications for this visit.     Allergies: Citrus; Ibuprofen; and Vicodin [hydrocodone-acetaminophen]  Past Medical History:  Diagnosis Date  . Headache   . Sciatic nerve pain   . UTI (lower urinary tract infection)     Past Surgical History:  Procedure Laterality Date  . APPENDECTOMY    . CHOLECYSTECTOMY    . ENDOMETRIAL ABLATION    . KNEE ARTHROSCOPY    . SHOULDER SURGERY Left   . TONSILLECTOMY      Family History  Problem Relation Age of Onset  . Colon cancer Maternal Grandmother   . Healthy Mother   . Congestive Heart Failure Father     Social History   Tobacco Use  . Smoking status: Current Every Day Smoker    Packs/day: 0.50    Years: 10.00    Pack years: 5.00    Types: Cigarettes  . Smokeless tobacco: Never Used  Substance Use Topics  .  Alcohol use: No    Subjective:  Patient was admitted to the hospital on 7/16 and kept for one night with bronchitis; was discharged on oral steroids/ albuterol inhaler; admits that she is still having difficulty breathing/ wheezing; does feel that symptoms are improved from when she was at the ER; + chest pain with inhalation; feels feverish; trying to quit smoking; no documented asthma but notes that "colds always settle in my chest."  Objective:  Vitals:   03/27/18 1532  BP: 138/90  Pulse: 83  Temp: 99.1 F (37.3 C)  TempSrc: Oral  SpO2: 98%  Weight: (!) 337 lb (152.9 kg)  Height: 5\' 6"  (1.676 m)    General: Well developed, well nourished, in no acute distress  Skin : Warm and dry.  Head: Normocephalic and atraumatic  Eyes: Sclera and conjunctiva clear; pupils round and reactive to light; extraocular movements intact  Ears: External normal; canals clear; tympanic membranes normal  Oropharynx: Pink, supple. No suspicious lesions  Neck: Supple without thyromegaly, adenopathy  Lungs: Respirations unlabored; audible wheezing without using stethoscope; duo-neb given in office with good improvement of symptoms.   CVS exam: normal rate and regular rhythm.  Neurologic: Alert and oriented; speech intact; face symmetrical; moves all extremities well; CNII-XII intact without focal deficit   Assessment:  1. Cough   2. Acute bronchitis, unspecified organism  Plan:  STAT CXR today does not show pneumonia; Duo-neb given with benefit; continue oral prednisone as prescribed by hospital; add Augmentin 875 mg bid x 10 days and Symbicort 160 2 puffs bid; increase fluids, rest and follow-up on Wednesday for re-check; work note given as requested.   Return for Friday for re-check.  Orders Placed This Encounter  Procedures  . DG Chest 2 View    Standing Status:   Future    Number of Occurrences:   1    Standing Expiration Date:   05/29/2019    Order Specific Question:   Reason for Exam  (SYMPTOM  OR DIAGNOSIS REQUIRED)    Answer:   cough/ wheezing    Order Specific Question:   Is patient pregnant?    Answer:   No    Order Specific Question:   Preferred imaging location?    Answer:   [address]    Order Specific Question:   Radiology Contrast Protocol - do NOT remove file path    Answer:   [email or web address]    Requested Prescriptions   Signed Prescriptions Disp Refills  . amoxicillin-clavulanate (AUGMENTIN) 875-125 MG tablet 20 tablet 0    Sig: Take 1 tablet by mouth 2 (two) times daily.

## 2018-03-29 ENCOUNTER — Ambulatory Visit (INDEPENDENT_AMBULATORY_CARE_PROVIDER_SITE_OTHER): Payer: 59 | Admitting: Family

## 2018-03-29 ENCOUNTER — Encounter: Payer: Self-pay | Admitting: Family

## 2018-03-29 VITALS — BP 136/88 | HR 85 | Temp 98.4°F | Ht 66.0 in

## 2018-03-29 DIAGNOSIS — J209 Acute bronchitis, unspecified: Secondary | ICD-10-CM

## 2018-03-29 DIAGNOSIS — J40 Bronchitis, not specified as acute or chronic: Secondary | ICD-10-CM

## 2018-03-29 MED ORDER — HYDROCOD POLST-CPM POLST ER 10-8 MG/5ML PO SUER: 5.0000 mL | Freq: Two times a day (BID) | ORAL | 0 refills | Status: DC | PRN

## 2018-03-29 NOTE — Progress Notes (Signed)
Tiffany Conley is a 37 y.o. female with the following history as recorded in EpicCare:  Patient Active Problem List   Diagnosis Date Noted  . Dyspnea   . Bronchitis 03/21/2018  . Acute lateral meniscus tear of left knee 08/23/2017  . Diarrhea 06/01/2017  . Nausea with vomiting 04/18/2017  . Low back pain 04/18/2017  . Occipital neuralgia of right side 04/27/2016  . Common migraine without intractability 04/27/2016  . Neck pain 04/27/2016  . 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 INTERNAL DERANGEMENT, RIGHT KNEE 09/08/2010  . Lateral meniscus tear 09/08/2010    Current Outpatient Medications  Medication Sig Dispense Refill  . albuterol (PROVENTIL HFA;VENTOLIN HFA) 108 (90 Base) MCG/ACT inhaler Inhale 2 puffs into the lungs every 4 (four) hours as needed for wheezing or shortness of breath. 1 Inhaler 2  . amoxicillin-clavulanate (AUGMENTIN) 875-125 MG tablet Take 1 tablet by mouth 2 (two) times daily. 20 tablet 0  . predniSONE (DELTASONE) 20 MG tablet Take 40 mg for 3 days then 30 mg for next  3 days and 20 mg for next 3 days and 10 mg till done 30 tablet 0  . chlorpheniramine-HYDROcodone (TUSSIONEX PENNKINETIC ER) 10-8 MG/5ML SUER Take 5 mLs by mouth every 12 (twelve) hours as needed for cough. 100 mL 0   No current facility-administered medications for this visit.     Allergies: Citrus; Ibuprofen; and Vicodin [hydrocodone-acetaminophen]  Past Medical History:  Diagnosis Date  . Headache   . Sciatic nerve pain   . UTI (lower urinary tract infection)     Past Surgical History:  Procedure Laterality Date  . APPENDECTOMY    . CHOLECYSTECTOMY    . ENDOMETRIAL ABLATION    . KNEE ARTHROSCOPY    . SHOULDER SURGERY Left   . TONSILLECTOMY      Family History  Problem Relation Age of Onset  . Colon cancer Maternal Grandmother   . Healthy Mother   . Congestive Heart Failure Father     Social History   Tobacco Use  . Smoking status: Current Every  Day Smoker    Packs/day: 0.50    Years: 10.00    Pack years: 5.00    Types: Cigarettes  . Smokeless tobacco: Never Used  Substance Use Topics  . Alcohol use: No    Subjective:  2 day follow up on cough/ acute bronchitis; feeling " a little better" but notes still having problems with cough at night; now on Augmentin twice a day; Symbicort 160 twice a day; prednisone now down to 1/2 pill per day (10 mg); no  fever; +drainage in back of throat;   Objective:  Vitals:   03/29/18 1007  BP: 136/88  Pulse: 85  Temp: 98.4 F (36.9 C)  TempSrc: Oral  SpO2: 99%  Height: 5\' 6"  (1.676 m)    General: Well developed, well nourished, in no acute distress  Skin : Warm and dry.  Head: Normocephalic and atraumatic  Eyes: Sclera and conjunctiva clear; pupils round and reactive to light; extraocular movements intact  Ears: External normal; canals clear; tympanic membranes normal  Oropharynx: Pink, supple. No suspicious lesions  Neck: Supple without thyromegaly, adenopathy  Lungs: Respirations unlabored; clear to auscultation bilaterally without wheeze, rales, rhonchi  CVS exam: normal rate and regular rhythm.  Neurologic: Alert and oriented; speech intact; face symmetrical; moves all extremities well; CNII-XII intact without focal deficit    Assessment:  1. Acute bronchitis, unspecified organism   2. Frequent episodes of bronchitis  Plan:  1. Clinically improving; she is to stay on 20 mg of prednisone until medication completed; continue Symbicort 160 2 puffs bid; will give Rx for Tussionex to help at night- she notes she can tolerate the cough syrup with Hydrocodone; needs to quit smoking; work note for remainder of the week-plan to return next Monday;  2. ? Underlying asthma; + smoker; refer to pulmonology for further evaluation.    No follow-ups on file.  Orders Placed This Encounter  Procedures  . Ambulatory referral to Pulmonology    Referral Priority:   Routine    Referral Type:    Consultation    Referral Reason:   Specialty Services Required    Requested Specialty:   Pulmonary Disease    Number of Visits Requested:   1    Requested Prescriptions   Signed Prescriptions Disp Refills  . chlorpheniramine-HYDROcodone (TUSSIONEX PENNKINETIC ER) 10-8 MG/5ML SUER 100 mL 0    Sig: Take 5 mLs by mouth every 12 (twelve) hours as needed for cough.

## 2018-04-03 ENCOUNTER — Encounter: Payer: Self-pay | Admitting: Family

## 2018-04-03 ENCOUNTER — Ambulatory Visit: Payer: Self-pay

## 2018-04-03 ENCOUNTER — Ambulatory Visit (INDEPENDENT_AMBULATORY_CARE_PROVIDER_SITE_OTHER): Payer: 59 | Admitting: Family

## 2018-04-03 VITALS — BP 134/84 | HR 102 | Temp 99.6°F | Ht 66.0 in | Wt 344.0 lb

## 2018-04-03 DIAGNOSIS — B370 Candidal stomatitis: Secondary | ICD-10-CM | POA: Diagnosis not present

## 2018-04-03 MED ORDER — NYSTATIN 100000 UNIT/ML MT SUSP: 5.0000 mL | Freq: Four times a day (QID) | OROMUCOSAL | 0 refills | Status: DC

## 2018-04-03 NOTE — Patient Instructions (Signed)
Stop the Symbicort for now; Take 3 more days of the Prednisone ( 20 mg) x 2 days ( 10 mg);

## 2018-04-03 NOTE — Progress Notes (Signed)
Tiffany Conley is a 37 y.o. female with the following history as recorded in EpicCare:  Patient Active Problem List   Diagnosis Date Noted  . Dyspnea   . Bronchitis 03/21/2018  . Acute lateral meniscus tear of left knee 08/23/2017  . Diarrhea 06/01/2017  . Nausea with vomiting 04/18/2017  . Low back pain 04/18/2017  . Occipital neuralgia of right side 04/27/2016  . Common migraine without intractability 04/27/2016  . Neck pain 04/27/2016  . 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 INTERNAL DERANGEMENT, RIGHT KNEE 09/08/2010  . Lateral meniscus tear 09/08/2010    Current Outpatient Medications  Medication Sig Dispense Refill  . albuterol (PROVENTIL HFA;VENTOLIN HFA) 108 (90 Base) MCG/ACT inhaler Inhale 2 puffs into the lungs every 4 (four) hours as needed for wheezing or shortness of breath. 1 Inhaler 2  . amoxicillin-clavulanate (AUGMENTIN) 875-125 MG tablet Take 1 tablet by mouth 2 (two) times daily. 20 tablet 0  . chlorpheniramine-HYDROcodone (TUSSIONEX PENNKINETIC ER) 10-8 MG/5ML SUER Take 5 mLs by mouth every 12 (twelve) hours as needed for cough. 100 mL 0  . fluconazole (DIFLUCAN) 100 MG tablet TAKE ONE TABLET BY MOUTH DAILY FOR 3 DAYS.  0  . predniSONE (DELTASONE) 20 MG tablet Take 40 mg for 3 days then 30 mg for next  3 days and 20 mg for next 3 days and 10 mg till done 30 tablet 0  . nystatin (MYCOSTATIN) 100000 UNIT/ML suspension Take 5 mLs (500,000 Units total) by mouth 4 (four) times daily. 200 mL 0   No current facility-administered medications for this visit.     Allergies: Citrus; Ibuprofen; and Vicodin [hydrocodone-acetaminophen]  Past Medical History:  Diagnosis Date  . Headache   . Sciatic nerve pain   . UTI (lower urinary tract infection)     Past Surgical History:  Procedure Laterality Date  . APPENDECTOMY    . CHOLECYSTECTOMY    . ENDOMETRIAL ABLATION    . KNEE ARTHROSCOPY    . SHOULDER SURGERY Left   . TONSILLECTOMY       Family History  Problem Relation Age of Onset  . Colon cancer Maternal Grandmother   . Healthy Mother   . Congestive Heart Failure Father     Social History   Tobacco Use  . Smoking status: Current Every Day Smoker    Packs/day: 0.50    Years: 10.00    Pack years: 5.00    Types: Cigarettes  . Smokeless tobacco: Never Used  Substance Use Topics  . Alcohol use: No    Subjective:  Patient presents with concerns for thrush that has developed in the past few days; has had to take multiple antibiotics/ oral steroids/ inhaled steroids due to recent acute bronchitis; has taken 3 tablets of oral Diflucan with no relief; wonders about oral Nystatin instead; admits that has not been rinsing/ spitting after using the Symbicort; Does feel that her breathing is improving- ready to go to work tomorrow.   Objective:  Vitals:   04/03/18 1435  BP: 134/84  Pulse: (!) 102  Temp: 99.6 F (37.6 C)  TempSrc: Oral  SpO2: 96%  Weight: (!) 344 lb 0.6 oz (156.1 kg)  Height: 5\' 6"  (1.676 m)    General: Well developed, well nourished, in no acute distress  Skin : Warm and dry.  Head: Normocephalic and atraumatic  Oropharynx: Pink, supple. No suspicious lesions/ c/w thrush on tongue  Neck: Supple without thyromegaly, adenopathy  Lungs: Respirations unlabored; clear to auscultation bilaterally  without wheeze, rales, rhonchi  CVS exam: normal rate and regular rhythm.  Neurologic: Alert and oriented; speech intact; face symmetrical; moves all extremities well; CNII-XII intact without focal deficit   Assessment:  1. Oral thrush     Plan:  Stop Symbicort for now; finish steroid taper- 20 mg of prednisone x 3 days and then 10 mg x 2 days; Rx for oral Nystatin given- use as directed; follow-up worse, no better.   No follow-ups on file.  No orders of the defined types were placed in this encounter.   Requested Prescriptions   Signed Prescriptions Disp Refills  . nystatin (MYCOSTATIN) 100000  UNIT/ML suspension 200 mL 0    Sig: Take 5 mLs (500,000 Units total) by mouth 4 (four) times daily.

## 2018-04-03 NOTE — Telephone Encounter (Signed)
FYI

## 2018-04-03 NOTE — Telephone Encounter (Signed)
Patient called in with c/o "throat swelling." She says "I woke up this morning with a sore throat and hard to swallow. I drank coffee and water this morning with no problem. I haven't tried eating anything. I have thrush in my mouth that flared up the weekend, so I started the Diflucan for that. I'm on prednisone." I asked about other symptoms, she says "just a headache, which I get on prednisone." According to protocol, see PCP within 3 days, appointment schedule for today at 1420 with Ria ClockLaura Murray, NP, care advice given, patient verbalized understanding.   Reason for Disposition . [1] Sore throat with cough/cold symptoms AND [2] present > 5 days  Answer Assessment - Initial Assessment Questions 1. ONSET: "When did the throat start hurting?" (Hours or days ago)      This morning 2. SEVERITY: "How bad is the sore throat?" (Scale 1-10; mild, moderate or severe)   - MILD (1-3):  doesn't interfere with eating or normal activities   - MODERATE (4-7): interferes with eating some solids and normal activities   - SEVERE (8-10):  excruciating pain, interferes with most normal activities   - SEVERE DYSPHAGIA: can't swallow liquids, drooling     Mild-Moderate 3. STREP EXPOSURE: "Has there been any exposure to strep within the past week?" If so, ask: "What type of contact occurred?"      No 4.  VIRAL SYMPTOMS: "Are there any symptoms of a cold, such as a runny nose, cough, hoarse voice or red eyes?"      Yes 5. FEVER: "Do you have a fever?" If so, ask: "What is your temperature, how was it measured, and when did it start?"     No 6. PUS ON THE TONSILS: "Is there pus on the tonsils in the back of your throat?"     No tonsils 7. OTHER SYMPTOMS: "Do you have any other symptoms?" (e.g., difficulty breathing, headache, rash)     Headache, thrush on medication 8. PREGNANCY: "Is there any chance you are pregnant?" "When was your last menstrual period?"     N/A  Protocols used: SORE THROAT-A-AH

## 2018-04-04 ENCOUNTER — Telehealth: Payer: Self-pay

## 2018-04-04 DIAGNOSIS — Z0279 Encounter for issue of other medical certificate: Secondary | ICD-10-CM

## 2018-04-04 NOTE — Telephone Encounter (Signed)
Copied from CRM [id number]. Topic: Quick Communication - See Telephone Encounter >> Apr 04, 2018  9:00 AM Waymon AmatoBurton, Donna F wrote: Pt was seen by Ria ClockLaura Murray yesterday and was diagnosed with thrush and she was wanting to know if she would consider taking her out of work  half days until the  Ginette Pitmanhrush is gone due to difficulty speaking   Best number [phone] ok to leave a message on voice mail >> Apr 04, 2018 11:49 AM Gean BirchwoodWilliams-Neal, Sade R wrote: Pt is calling in to provide fax number to fax a doctors note  Cb# [phone]

## 2018-04-04 NOTE — Telephone Encounter (Signed)
FMLA paperwork on your desk; I did 1/2 days through 04/07/18; can extend if necessary.

## 2018-04-05 NOTE — Telephone Encounter (Signed)
Called and left message for patient that paperwork was ready for pickup.  CRM created incase she calls back.

## 2018-04-06 ENCOUNTER — Telehealth: Payer: Self-pay

## 2018-04-06 NOTE — Telephone Encounter (Signed)
Paperwork faxed today to the attention of [place] D. Original paperwork placed up front for her to pick up.

## 2018-04-06 NOTE — Telephone Encounter (Signed)
Copy sent to scan &charged for.  °

## 2018-04-07 ENCOUNTER — Ambulatory Visit: Payer: Self-pay | Admitting: *Deleted

## 2018-04-07 NOTE — Telephone Encounter (Signed)
Agree needs to go to ER.

## 2018-04-07 NOTE — Telephone Encounter (Signed)
Pt having complaints of having trouble catching her breath.Pt sounds short of breath while talking on the phone with Triage RN. Pt denies any wheezing at this time. Pt states she woke up this morning and id not "feel too good". Pt states she was outside on yesterday in the heat and sun and felt like she may have "over done it". Pt was seen on 03/29/18 by Ria ClockLaura Murray and was treated for bronchitis and still has 3 days left of the antibiotic to take. Pt states she first began to experience shortness of breath 2-3 weeks ago and was getting better until yesterday. Pt states she has used albuterol inhaler twice today but is still experiencing trouble catching her breath. Pt advised to see treatment in the ED for current symptoms. Pt verbalized understanding.  Reason for Disposition . [1] MODERATE difficulty breathing (e.g., speaks in phrases, SOB even at rest, pulse 100-120) AND [2] NEW-onset or WORSE than normal  Answer Assessment - Initial Assessment Questions 1. RESPIRATORY STATUS: "Describe your breathing?" (e.g., wheezing, shortness of breath, unable to speak, severe coughing)      Shortness of breath 2. ONSET: "When did this breathing problem begin?"      Pt stats she has been dealing with it for 2-3 weeks but states she think she did too much activity which made her feel more short of breath 3. PATTERN "Does the difficult breathing come and go, or has it been constant since it started?"      Gets worse with activity 4. SEVERITY: "How bad is your breathing?" (e.g., mild, moderate, severe)    - MILD: No SOB at rest, mild SOB with walking, speaks normally in sentences, can lay down, no retractions, pulse < 100.    - MODERATE: SOB at rest, SOB with minimal exertion and prefers to sit, cannot lie down flat, speaks in phrases, mild retractions, audible wheezing, pulse 100-120.    - SEVERE: Very SOB at rest, speaks in single words, struggling to breathe, sitting hunched forward, retractions, pulse > 120     Moderate- shortness of breath gets worse with activity, breathing sounds labored while talking on the phone to triage nurse 5. RECURRENT SYMPTOM: "Have you had difficulty breathing before?" If so, ask: "When was the last time?" and "What happened that time?"      Yes was treated for bronchitis on 7/24 and went to the hospital prior to the appt 6. CARDIAC HISTORY: "Do you have any history of heart disease?" (e.g., heart attack, angina, bypass surgery, angioplasty)      Not assessed 7. LUNG HISTORY: "Do you have any history of lung disease?"  (e.g., pulmonary embolus, asthma, emphysema)     asthma 8. CAUSE: "What do you think is causing the breathing problem?"      Being out in the heat and son on yesterday 9. OTHER SYMPTOMS: "Do you have any other symptoms? (e.g., dizziness, runny nose, cough, chest pain, fever)     Pt states she currently has a cough and also is having pain in the middle of chest that gets worse with deep breathing 10. PREGNANCY: "Is there any chance you are pregnant?" "When was your last menstrual period?"       Not assessed 11. TRAVEL: "Have you traveled out of the country in the last month?" (e.g., travel history, exposures)       Not assessed  Protocols used: BREATHING DIFFICULTY-A-AH

## 2018-04-10 ENCOUNTER — Ambulatory Visit (INDEPENDENT_AMBULATORY_CARE_PROVIDER_SITE_OTHER): Payer: 59 | Admitting: Family

## 2018-04-10 ENCOUNTER — Encounter: Payer: Self-pay | Admitting: Family

## 2018-04-10 VITALS — BP 140/90 | HR 94 | Temp 98.4°F | Ht 66.0 in | Wt 348.0 lb

## 2018-04-10 DIAGNOSIS — R062 Wheezing: Secondary | ICD-10-CM | POA: Diagnosis not present

## 2018-04-10 DIAGNOSIS — R51 Headache: Secondary | ICD-10-CM | POA: Diagnosis not present

## 2018-04-10 DIAGNOSIS — R519 Headache, unspecified: Secondary | ICD-10-CM

## 2018-04-10 DIAGNOSIS — R079 Chest pain, unspecified: Secondary | ICD-10-CM

## 2018-04-10 MED ORDER — ALBUTEROL SULFATE 108 (90 BASE) MCG/ACT IN AEPB: 1.0000 | INHALATION_SPRAY | Freq: Four times a day (QID) | RESPIRATORY_TRACT | 3 refills | Status: DC | PRN

## 2018-04-10 MED ORDER — KETOROLAC TROMETHAMINE 30 MG/ML IJ SOLN
30.0000 mg | Freq: Once | INTRAMUSCULAR | Status: AC
  Administered 2018-04-10: 30 mg via INTRAMUSCULAR

## 2018-04-10 MED ORDER — ALBUTEROL SULFATE (2.5 MG/3ML) 0.083% IN NEBU
2.5000 mg | INHALATION_SOLUTION | Freq: Once | RESPIRATORY_TRACT | Status: AC
  Administered 2018-04-10: 2.5 mg via RESPIRATORY_TRACT

## 2018-04-10 NOTE — Progress Notes (Signed)
Tiffany Conley is a 37 y.o. female with the following history as recorded in EpicCare:  Patient Active Problem List   Diagnosis Date Noted  . Dyspnea   . Bronchitis 03/21/2018  . Acute lateral meniscus tear of left knee 08/23/2017  . Diarrhea 06/01/2017  . Nausea with vomiting 04/18/2017  . Low back pain 04/18/2017  . Occipital neuralgia of right side 04/27/2016  . Common migraine without intractability 04/27/2016  . Neck pain 04/27/2016  . 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 INTERNAL DERANGEMENT, RIGHT KNEE 09/08/2010  . Lateral meniscus tear 09/08/2010    Current Outpatient Medications  Medication Sig Dispense Refill  . chlorpheniramine-HYDROcodone (TUSSIONEX PENNKINETIC ER) 10-8 MG/5ML SUER Take 5 mLs by mouth every 12 (twelve) hours as needed for cough. 100 mL 0  . fluconazole (DIFLUCAN) 100 MG tablet TAKE ONE TABLET BY MOUTH DAILY FOR 3 DAYS.  0  . nystatin (MYCOSTATIN) 100000 UNIT/ML suspension Take 5 mLs (500,000 Units total) by mouth 4 (four) times daily. 200 mL 0  . Albuterol Sulfate (PROAIR RESPICLICK) 108 (90 Base) MCG/ACT AEPB Inhale 1 puff into the lungs 4 (four) times daily as needed. 1 each 3   Current Facility-Administered Medications  Medication Dose Route Frequency Provider Last Rate Last Dose  . ketorolac (TORADOL) 30 MG/ML injection 30 mg  30 mg Intramuscular Once Olive BassMurray, Shelsey Rieth Woodruff, FNP        Allergies: Citrus; Ibuprofen; and Vicodin [hydrocodone-acetaminophen]  Past Medical History:  Diagnosis Date  . Headache   . Sciatic nerve pain   . UTI (lower urinary tract infection)     Past Surgical History:  Procedure Laterality Date  . APPENDECTOMY    . CHOLECYSTECTOMY    . ENDOMETRIAL ABLATION    . KNEE ARTHROSCOPY    . SHOULDER SURGERY Left   . TONSILLECTOMY      Family History  Problem Relation Age of Onset  . Colon cancer Maternal Grandmother   . Healthy Mother   . Congestive Heart Failure Father     Social  History   Tobacco Use  . Smoking status: Current Every Day Smoker    Packs/day: 0.50    Years: 10.00    Pack years: 5.00    Types: Cigarettes  . Smokeless tobacco: Never Used  Substance Use Topics  . 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 Also feels that thrush is improving;   LMP- March 28, 2018;  Objective:  Vitals:   04/10/18 1458  BP: 140/90  Pulse: 94  Temp: 98.4 F (36.9 C)  TempSrc: Oral  SpO2: 99%  Weight: (!) 348 lb (157.9 kg)  Height: 5\' 6"  (1.676 m)    General: Well developed, well nourished, in no acute distress  Skin : Warm and dry.  Head: Normocephalic and atraumatic  Eyes:  Sclera and conjunctiva clear; pupils round and reactive to light; extraocular movements intact  Ears: External normal; canals clear; tympanic membranes normal  Oropharynx: Pink, supple. No suspicious lesions  Neck: Supple without thyromegaly, adenopathy  Lungs: Respirations unlabored; wheezing noted in right upper lobe; CVS exam: normal rate and regular rhythm.  Neurologic: Alert and oriented; speech intact; face symmetrical; moves all extremities well; CNII-XII intact without focal deficit  Assessment:  1. Wheezing   2. Chest pain, unspecified type   3. Acute nonintractable  headache, unspecified headache type     Plan:  1. Albuterol nebulizer treatment given in office with benefit; she is encouraged to re-start Symbicort and use twice a day; she is encouraged to follow-up with pulmonology about referral; 2. Update EKG- NSR; CT was done 2 weeks ago and did not show PE;  3. Toradol IM 30 mg given in office- patient has tolerated in the past and no concerns today;  Work note given as requested for past 3 days;   No follow-ups on file.  Orders Placed This Encounter  Procedures  . EKG 12-Lead    Requested Prescriptions   Signed Prescriptions Disp Refills  . Albuterol Sulfate (PROAIR RESPICLICK) 108 (90 Base) MCG/ACT AEPB 1 each 3    Sig: Inhale 1 puff into the lungs 4 (four) times daily as needed.

## 2018-04-10 NOTE — Patient Instructions (Signed)
[phone]       [facility])- you can call to follow-up on your referral.

## 2018-04-11 ENCOUNTER — Other Ambulatory Visit: Payer: Self-pay | Admitting: Family

## 2018-04-11 ENCOUNTER — Telehealth: Payer: Self-pay

## 2018-04-11 DIAGNOSIS — G44309 Post-traumatic headache, unspecified, not intractable: Secondary | ICD-10-CM

## 2018-04-11 NOTE — Telephone Encounter (Signed)
Do you need to update her paperwork to reflect her half days?

## 2018-04-11 NOTE — Telephone Encounter (Signed)
Not sure what she is asking? FMLA was completed to cover the 1/2 days as originally requested.

## 2018-04-13 ENCOUNTER — Encounter: Payer: Self-pay | Admitting: Internal Medicine

## 2018-04-13 ENCOUNTER — Ambulatory Visit (INDEPENDENT_AMBULATORY_CARE_PROVIDER_SITE_OTHER): Payer: 59 | Admitting: Internal Medicine

## 2018-04-13 VITALS — BP 130/80 | HR 95 | Ht 66.0 in | Wt 340.6 lb

## 2018-04-13 DIAGNOSIS — R0609 Other forms of dyspnea: Secondary | ICD-10-CM

## 2018-04-13 DIAGNOSIS — J4530 Mild persistent asthma, uncomplicated: Secondary | ICD-10-CM

## 2018-04-13 DIAGNOSIS — F17210 Nicotine dependence, cigarettes, uncomplicated: Secondary | ICD-10-CM | POA: Diagnosis not present

## 2018-04-13 DIAGNOSIS — R0600 Dyspnea, unspecified: Secondary | ICD-10-CM

## 2018-04-13 MED ORDER — PANTOPRAZOLE SODIUM 40 MG PO TBEC: 40.0000 mg | DELAYED_RELEASE_TABLET | Freq: Every day | ORAL | 2 refills | Status: DC

## 2018-04-13 MED ORDER — FAMOTIDINE 20 MG PO TABS: ORAL_TABLET | ORAL | 11 refills | Status: DC

## 2018-04-13 MED ORDER — ALBUTEROL SULFATE HFA 108 (90 BASE) MCG/ACT IN AERS: INHALATION_SPRAY | RESPIRATORY_TRACT | 11 refills | Status: DC

## 2018-04-13 MED ORDER — BUDESONIDE-FORMOTEROL FUMARATE 80-4.5 MCG/ACT IN AERO: INHALATION_SPRAY | RESPIRATORY_TRACT | 12 refills | Status: DC

## 2018-04-13 NOTE — Progress Notes (Signed)
Tiffany Conley, female    DOB: Nov 14, 1980,     MRN: [id number]   Brief patient profile:  71 yobf active smoker with asthma as child bad enough to go ER and take allergy shots per Dr Orson Aloe age 37-12 and seemed to go away completely then pattern of bad bronchitis starting in 20's(p started smoking)  requiring pred/ abx/ cough meds but no maint rx then new pattern of chest tightness/ sob > [facility] 03/21/18 admit    Admit date: 03/21/2018 Discharge date: 03/22/2018  Brief/Interim Summary:36 y.o.femalewithno significantmedical history presented to the emergency department complaining of shortness of breath associated with cough and chest discomfort. Patientstated symptoms started suddenly 1 day prior to admission. Associated symptoms include suggestive fever and body aches. Also reports dyspnea on exertion, however denies orthopnea and lower extremity edema. Patient used her home inhaler with no relief. Patient denies any sick contacts or recent travel. Patient is active smoker.  ED Course:Upon ED evaluation patient was found to be tachycardic, dyspneic but not hypoxic, she was given albuterol and Solu-Medrol with some improvement. CT of the chest was performed due to concerns of PE however this was negative for blood clot.5mm pulmonary nodule noted on left lung apex. ED physician attempted to discharge patient however she was unable to ambulate without having severe dyspnea.    Discharge Diagnoses:  Active Problems:   Bronchitis 1] acute bronchitis improved with p.o. steroids and inhalers and nebulizing treatments.  She ambulated prior to discharge her saturation was above 98% on room air and after ambulation.  She was able to be discharged home.  I will discharge her on prednisone taper and inhalers she was taking at home.  Patient continues to smoke discussed about smoking cessation.  I have also given her a prescription for Diflucan for 3 days as patient complains of having  pressure every time she goes on steroids.  2] pulmonary nodules 3.5 mm nodules in the left lung apex follow-up CT scan in 12 months patient is a smoker.       04/13/2018  1st pulmoary  Smoker with bronchitis/ finished pred 04/07/18  Chief Complaint  Patient presents with  . Pulmonary Consult    Referred by Dr. Ria Clock. Pt c/o SOB and CP since mid July 2019. She states she gets winded just talking.  She has a prod cough with min yellow sputum- worse in the early am and at night She is using albuterol 4-5 x per day.  symptoms started with typical cough over 24 h then admitted as above with neg Cta and much better at [facility] but doe = MMRC3 = can't walk 100 yards even at a slow pace at a flat grade s stopping due to sob  eg [employer] shopping vs baseline able to walk anywhere inside store ie flat /cool environement   Since d/c started on symbicort ? Strength and alb took last  around 4 h prior to OV   Cough: first thing in am yellowish small amt  Sleep: propped up 45 degree SABA use: alb 4-5 x per day   gen ant cp from coughing fits since admit    No obvious other patterns day to day or daytime variability or assoc excess/ purulent sputum or mucus plugs or hemoptysis or  chest tightness, subjective wheeze or overt sinus or hb symptoms.    Also denies any obvious fluctuation of symptoms with weather or environmental changes or other aggravating or alleviating factors except as outlined above   No unusual  exposure hx or h/o childhood pna/  or knowledge of premature birth.  Current Allergies, Complete Past Medical History, Past Surgical History, Family History, and Social History were reviewed in [employer] record.  ROS  The following are not active complaints unless bolded Hoarseness, sore throat, dysphagia, dental problems, itching, sneezing,  nasal congestion or discharge of excess mucus or purulent secretions, ear ache,   fever, chills, sweats, unintended wt loss or  wt gain, classically pleuritic or exertional cp,  orthopnea pnd or arm/hand swelling  or leg swelling, presyncope, palpitations, abdominal pain, anorexia, nausea, vomiting, diarrhea  or change in bowel habits or change in bladder habits, change in stools or change in urine, dysuria, hematuria,  rash, arthralgias, visual complaints, headache, numbness, weakness or ataxia or problems with walking or coordination,  change in mood or  memory.                Past Medical History:  Diagnosis Date  . Headache   . Sciatic nerve pain   . UTI (lower urinary tract infection)     Outpatient Medications Prior to Visit  Medication Sig Dispense Refill  . Albuterol Sulfate (PROAIR RESPICLICK) 108 (90 Base) MCG/ACT AEPB Inhale 1 puff into the lungs 4 (four) times daily as needed. 1 each 3  . chlorpheniramine-HYDROcodone (TUSSIONEX PENNKINETIC ER) 10-8 MG/5ML SUER Take 5 mLs by mouth every 12 (twelve) hours as needed for cough. 100 mL 0  . nystatin (MYCOSTATIN) 100000 UNIT/ML suspension Take 5 mLs (500,000 Units total) by mouth 4 (four) times daily. 200 mL 0  . fluconazole (DIFLUCAN) 100 MG tablet TAKE ONE TABLET BY MOUTH DAILY FOR 3 DAYS.  0   No facility-administered medications prior to visit.              Objective:     BP 130/80 (BP Location: Right Arm, Cuff Size: Large)   Pulse 95   Ht 5\' 6"  (1.676 m)   Wt (!) 340 lb 9.6 oz (154.5 kg)   LMP 03/24/2018 (Approximate)   SpO2 99%   BMI 54.97 kg/m   SpO2: 99 %   RA  Obese amb bf nad with saba 4 h prior to exam  HEENT: nl dentition, turbinates bilaterally, and oropharynx. Nl external ear canals without cough reflex   NECK :  without JVD/Nodes/TM/ nl carotid upstrokes bilaterally   LUNGS: no acc muscle use,  Nl contour chest which is clear to A and P bilaterally without cough on insp or exp maneuvers   CV:  RRR  no s3 or murmur or increase in P2, and no edema   ABD:  soft and nontender with nl inspiratory excursion in the  supine position. No bruits or organomegaly appreciated, bowel sounds nl  MS:  Nl gait/ ext warm without deformities, calf tenderness, cyanosis or clubbing No obvious joint restrictions   SKIN: warm and dry without lesions    NEURO:  alert, approp, nl sensorium with  no motor or cerebellar deficits apparent.      I personally reviewed images and agree with radiology impression as follows:  CTa chest 03/21/18 1. Suboptimal study due to contrast bolus timing and motion artifact. No evidence of central or definite lobar pulmonary embolism. The segmental pulmonary arteries are not well evaluated. 2. A few small ground-glass densities in the right upper lobe are likely infectious or inflammatory. Reactive right hilar lymph nodes. 3. 5 mm pulmonary nodule in the left lung apex. No follow-up needed if patient is low-risk.  Non-contrast chest CT can be considered in 12 months if patient is high-risk        Assessment   Dyspnea on exertion 04/13/2018  Walked RA x 3 laps @ 185 ft each stopped due to  End of study, nl pace, no sob or desat  Symptoms are markedly disproportionate to objective findings and not clear to what extent this is actually a pulmonary  problem but pt does appear to have difficult to sort out respiratory symptoms of unknown origin for which  DDX  = almost all start with A and  include Adherence, Ace Inhibitors, Acid Reflux, Active Sinus Disease, Alpha 1 Antitripsin deficiency, Anxiety masquerading as Airways dz,  ABPA,  Allergy(esp in young), Aspiration (esp in elderly), Adverse effects of meds,  Active smokers, A bunch of PE's/clot burden (a few small clots can't cause this syndrome unless there is already severe underlying pulm or vascular dz with poor reserve),  Anemia or thyroid disorder, plus two Bs  = Bronchiectasis and Beta blocker use..and one C= CHF     Adherence is always the initial "prime suspect" and is a multilayered concern that requires a "trust but verify"  approach in every patient - starting with knowing how to use medications, especially inhalers, correctly, keeping up with refills and understanding the fundamental difference between maintenance and prns vs those medications only taken for a very short course and then stopped and not refilled.  - see hfa teaching under Asthma dx - return with all meds in hand using a trust but verify approach to confirm accurate Medication  Reconciliation The principal here is that until we are certain that the  patients are doing what we've asked, it makes no sense to ask them to do more.   Active smoking greatest other concern (see separate a/p)   ? Acid (or non-acid) GERD > always difficult to exclude as up to 75% of pts in some series report no assoc GI/ Heartburn symptoms> rec max (24h)  acid suppression and diet restrictions/ reviewed and instructions given in writing.   ? Allergy > w/u on return when off prednisone longer  ? ABPA > check IgE on return   ? Alpha one AT def > very rare in AA population and strongly doubt here   ? Anxiety /depression/ deconditioning > usually at the bottom of this list of usual suspects but should be   on this pt's based on H and P and  may interfere with adherence and also interpretation of response or lack thereof to symptom management which can be quite subjective.   ? Bunch of PE's > neg CTa 03/21/18        Mild persistent asthma, poorly controlled 04/13/2018  After extensive coaching inhaler device  effectiveness =    75% from a baseline of 25%   She is clear on exam now but clearly overusing saba  Adherence is always the initial "prime suspect" and is a multilayered concern that requires a "trust but verify" approach in every patient - starting with knowing how to use medications, especially inhalers, correctly, keeping up with refills and understanding the fundamental difference between maintenance and prns vs those medications only taken for a very short course  and then stopped and not refilled.   Will continue symbicort 160 and try to convince  her to use less saba/ wean tussionex to delsym and    regroup in 4 weeks     Cigarette smoker 4-5 min discussion re active cigarette smoking in  addition to office E&M  Ask about tobacco use:   active Advise quitting   I emphasized that although we never turn away smokers from the pulmonary clinic, we do ask that they understand that the recommendations that we make  won't work nearly as well in the presence of continued cigarette exposure. In fact, we may very well  reach a point where we can't promise to help the patient if he/she can't quit smoking. (We can and will promise to try to help, we just can't promise what we recommend will really work)  Assess willingness:  Not committed at this point Assist in quit attempt:  Per PCP when ready Arrange follow up:   Follow up per Primary Care planned           Sandrea Hughs, MD 04/13/2018

## 2018-04-13 NOTE — Telephone Encounter (Signed)
Called and left message for patient to return call to clinic if she needed anything for work. CRM created incase she calls back.

## 2018-04-13 NOTE — Patient Instructions (Addendum)
Plan A = Automatic = Symbicort 80 Take 2 puffs first thing in am and then another 2 puffs about 12 hours later.   Work on inhaler technique:  relax and gently blow all the way out then take a nice smooth deep breath back in, triggering the inhaler at same time you start breathing in.  Hold for up to 5 seconds if you can. Blow out thru nose. Rinse and gargle with water when done   Plan B = Backup Only use your albuterol (proventil)  as a rescue medication to be used if you can't catch your breath by resting or doing a relaxed purse lip breathing pattern.  - The less you use it, the better it will work when you need it. - Ok to use the inhaler up to 2 puffs  every 4 hours if you must but call for appointment if use goes up over your usual need - Don't leave home without it !!  (think of it like the spare tire for your car)     Pantoprazole (protonix) 40 mg   Take  30-60 min before first meal of the day and Pepcid (famotidine)  20 mg one @  bedtime until return to office - this is the best way to tell whether stomach acid is contributing to your problem.     GERD (REFLUX)  is an extremely common cause of respiratory symptoms just like yours , many times with no obvious heartburn at all.    It can be treated with medication, but also with lifestyle changes including elevation of the head of your bed (ideally with 6 inch  bed blocks),  Smoking cessation, avoidance of late meals, excessive alcohol, and avoid fatty foods, chocolate, peppermint, colas, red wine, and acidic juices such as orange juice.  NO MINT OR MENTHOL PRODUCTS SO NO COUGH DROPS   USE SUGARLESS CANDY INSTEAD (Jolley ranchers or Stover's or Life Savers) or even ice chips will also do - the key is to swallow to prevent all throat clearing. NO OIL BASED VITAMINS - use powdered substitutes.  For cough daytime > delsym 2 tsp every 12 hours as needed    Please schedule a follow up office visit in 4 weeks, sooner if needed  with all  medications /inhalers/ solutions in hand so we can verify exactly what you are taking. This includes all medications from all doctors and over the counters

## 2018-04-14 ENCOUNTER — Encounter: Payer: Self-pay | Admitting: Internal Medicine

## 2018-04-14 DIAGNOSIS — F17210 Nicotine dependence, cigarettes, uncomplicated: Secondary | ICD-10-CM | POA: Insufficient documentation

## 2018-04-14 DIAGNOSIS — J4530 Mild persistent asthma, uncomplicated: Secondary | ICD-10-CM | POA: Insufficient documentation

## 2018-04-14 NOTE — Assessment & Plan Note (Signed)
04/13/2018  Walked RA x 3 laps @ 185 ft each stopped due to  End of study, nl pace, no sob or desat  Symptoms are markedly disproportionate to objective findings and not clear to what extent this is actually a pulmonary  problem but pt does appear to have difficult to sort out respiratory symptoms of unknown origin for which  DDX  = almost all start with A and  include Adherence, Ace Inhibitors, Acid Reflux, Active Sinus Disease, Alpha 1 Antitripsin deficiency, Anxiety masquerading as Airways dz,  ABPA,  Allergy(esp in young), Aspiration (esp in elderly), Adverse effects of meds,  Active smokers, A bunch of PE's/clot burden (a few small clots can't cause this syndrome unless there is already severe underlying pulm or vascular dz with poor reserve),  Anemia or thyroid disorder, plus two Bs  = Bronchiectasis and Beta blocker use..and one C= CHF     Adherence is always the initial "prime suspect" and is a multilayered concern that requires a "trust but verify" approach in every patient - starting with knowing how to use medications, especially inhalers, correctly, keeping up with refills and understanding the fundamental difference between maintenance and prns vs those medications only taken for a very short course and then stopped and not refilled.  - see hfa teaching under Asthma dx - return with all meds in hand using a trust but verify approach to confirm accurate Medication  Reconciliation The principal here is that until we are certain that the  patients are doing what we've asked, it makes no sense to ask them to do more.   Active smoking greatest other concern (see separate a/p)   ? Acid (or non-acid) GERD > always difficult to exclude as up to 75% of pts in some series report no assoc GI/ Heartburn symptoms> rec max (24h)  acid suppression and diet restrictions/ reviewed and instructions given in writing.   ? Allergy > w/u on return when off prednisone longer  ? ABPA > check IgE on return   ?  Alpha one AT def > very rare in AA population and strongly doubt here   ? Anxiety /depression/ deconditioning > usually at the bottom of this list of usual suspects but should be   on this pt's based on H and P and  may interfere with adherence and also interpretation of response or lack thereof to symptom management which can be quite subjective.   ? Bunch of PE's > neg CTa 03/21/18

## 2018-04-14 NOTE — Assessment & Plan Note (Signed)
04/13/2018  After extensive coaching inhaler device  effectiveness =    75% from a baseline of 25%   She is clear on exam now but clearly overusing saba  Adherence is always the initial "prime suspect" and is a multilayered concern that requires a "trust but verify" approach in every patient - starting with knowing how to use medications, especially inhalers, correctly, keeping up with refills and understanding the fundamental difference between maintenance and prns vs those medications only taken for a very short course and then stopped and not refilled.   Will continue symbicort 160 and try to convince  her to use less saba/ wean tussionex to delsym and    regroup in 4 weeks

## 2018-04-14 NOTE — Assessment & Plan Note (Signed)
4-5 min discussion re active cigarette smoking in addition to office E&M  Ask about tobacco use:   active Advise quitting   I emphasized that although we never turn away smokers from the pulmonary clinic, we do ask that they understand that the recommendations that we make  won't work nearly as well in the presence of continued cigarette exposure. In fact, we may very well  reach a point where we can't promise to help the patient if he/she can't quit smoking. (We can and will promise to try to help, we just can't promise what we recommend will really work)  Assess willingness:  Not committed at this point Assist in quit attempt:  Per PCP when ready Arrange follow up:   Follow up per Primary Care planned       

## 2018-04-17 ENCOUNTER — Encounter: Payer: Self-pay | Admitting: Family

## 2018-04-17 ENCOUNTER — Ambulatory Visit (INDEPENDENT_AMBULATORY_CARE_PROVIDER_SITE_OTHER): Payer: 59 | Admitting: Family

## 2018-04-17 ENCOUNTER — Telehealth: Payer: Self-pay | Admitting: Internal Medicine

## 2018-04-17 VITALS — BP 132/78 | HR 96 | Temp 99.6°F | Ht 66.0 in | Wt 349.1 lb

## 2018-04-17 DIAGNOSIS — L0291 Cutaneous abscess, unspecified: Secondary | ICD-10-CM | POA: Diagnosis not present

## 2018-04-17 MED ORDER — DOXYCYCLINE HYCLATE 100 MG PO TABS: 100.0000 mg | ORAL_TABLET | Freq: Two times a day (BID) | ORAL | 0 refills | Status: DC

## 2018-04-17 NOTE — Progress Notes (Signed)
Shon HoughLashara Jewel is a 37 y.o. female with the following history as recorded in [employer]:  Patient Active Problem List   Diagnosis Date Noted  . Mild persistent asthma, poorly controlled 04/14/2018  . Cigarette smoker 04/14/2018  . Dyspnea on exertion   . Bronchitis 03/21/2018  . Acute lateral meniscus tear of left knee 08/23/2017  . Diarrhea 06/01/2017  . Nausea with vomiting 04/18/2017  . Low back pain 04/18/2017  . Occipital neuralgia of right side 04/27/2016  . Common migraine without intractability 04/27/2016  . Neck pain 04/27/2016  . 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 INTERNAL DERANGEMENT, RIGHT KNEE 09/08/2010  . Lateral meniscus tear 09/08/2010    Current Outpatient Medications  Medication Sig Dispense Refill  . albuterol (PROAIR HFA) 108 (90 Base) MCG/ACT inhaler 2 puffs every 4 hours as needed only  if your can't catch your breath 1 Inhaler 11  . budesonide-formoterol (SYMBICORT) 80-4.5 MCG/ACT inhaler Take 2 puffs first thing in am and then another 2 puffs about 12 hours later. 1 Inhaler 12  . chlorpheniramine-HYDROcodone (TUSSIONEX PENNKINETIC ER) 10-8 MG/5ML SUER Take 5 mLs by mouth every 12 (twelve) hours as needed for cough. 100 mL 0  . famotidine (PEPCID) 20 MG tablet One at bedtime 30 tablet 11  . nystatin (MYCOSTATIN) 100000 UNIT/ML suspension Take 5 mLs (500,000 Units total) by mouth 4 (four) times daily. 200 mL 0  . pantoprazole (PROTONIX) 40 MG tablet Take 1 tablet (40 mg total) by mouth daily. Take 30-60 min before first meal of the day 30 tablet 2  . doxycycline (VIBRA-TABS) 100 MG tablet Take 1 tablet (100 mg total) by mouth 2 (two) times daily. 20 tablet 0   No current facility-administered medications for this visit.     Allergies: Citrus; Ibuprofen; and Vicodin [hydrocodone-acetaminophen]  Past Medical History:  Diagnosis Date  . Headache   . Sciatic nerve pain   . UTI (lower urinary tract infection)     Past Surgical  History:  Procedure Laterality Date  . APPENDECTOMY    . CHOLECYSTECTOMY    . ENDOMETRIAL ABLATION    . KNEE ARTHROSCOPY    . SHOULDER SURGERY Left   . TONSILLECTOMY      Family History  Problem Relation Age of Onset  . Colon cancer Maternal Grandmother   . Healthy Mother   . Congestive Heart Failure Father     Social History   Tobacco Use  . Smoking status: Current Every Day Smoker    Packs/day: 0.50    Years: 10.00    Pack years: 5.00    Types: Cigarettes  . Smokeless tobacco: Never Used  Substance Use Topics  . Alcohol use: No    Subjective:  Patient presents with concerns for large, painful abscess on the right side of her low back; notes that she is prone to recurrent infections in her axillary region especially prior to her cycle; however, this particular abscess started within the past 24 hours- she has been trying to treat with warm compresses/ epsom salt soaks but notes that pain and swelling continue to worsen; area of concern is tender to palpation;     Objective:  Vitals:   04/17/18 1015  BP: 132/78  Pulse: 96  Temp: 99.6 F (37.6 C)  TempSrc: Oral  SpO2: 99%  Weight: (!) 349 lb 1.3 oz (158.3 kg)  Height: 5\' 6"  (1.676 m)    General: Well developed, well nourished, in no acute distress  Skin : Warm and  dry. Erythematous area noted to right of patient's spine; able to palpate area at least 4 cm in diameter; Head: Normocephalic and atraumatic  Lungs: Respirations unlabored;  Neurologic: Alert and oriented; speech intact; face symmetrical; moves all extremities well; CNII-XII intact without focal deficit    Assessment:  1. Abscess     Plan:  Rx for Doxycycline 100 mg bid x 10 days; continue to apply warm compresses; will refer to surgeon for evaluation- due to size and location, am hesitant to try I & D here.   No follow-ups on file.  Orders Placed This Encounter  Procedures  . Ambulatory referral to General Surgery    Referral Priority:   Urgent     Referral Type:   Surgical    Referral Reason:   Specialty Services Required    Requested Specialty:   General Surgery    Number of Visits Requested:   1    Requested Prescriptions   Signed Prescriptions Disp Refills  . doxycycline (VIBRA-TABS) 100 MG tablet 20 tablet 0    Sig: Take 1 tablet (100 mg total) by mouth 2 (two) times daily.

## 2018-04-17 NOTE — Telephone Encounter (Signed)
Spoke with pt, advised that we do not have symbicort samples at this time.  Tried to give pt a copay card for the pharmacy, but pt has already used this card and her copay is still over $120/30 day supply.  I advised pt to contact her insurance company to see what her preferred alternatives are, and to let us know so this can be changed.  Pt expressed understanding.  Will await call back from pt.

## 2018-04-18 NOTE — Telephone Encounter (Signed)
Called and spoke with pt regarding symbicort 80 inhaler samples At this time there is no samples available Pt advised that she has not called insurance for alternatives; she prefer to stay with symb Offered by pt asst forms for symbicort 80 Printed the forms and have them up front office ready for pick up Pt advised she is coming today to pick up

## 2018-04-19 DIAGNOSIS — L02212 Cutaneous abscess of back [any part, except buttock]: Secondary | ICD-10-CM | POA: Diagnosis not present

## 2018-04-19 NOTE — Telephone Encounter (Signed)
Pt has picked up her paperwork and samples this morning in office. Nothing further needed at this time.  Routing message to [place] to f/u with pt asst forms.

## 2018-05-02 ENCOUNTER — Ambulatory Visit (INDEPENDENT_AMBULATORY_CARE_PROVIDER_SITE_OTHER): Payer: 59 | Admitting: Family

## 2018-05-02 ENCOUNTER — Encounter: Payer: Self-pay | Admitting: Family

## 2018-05-02 VITALS — BP 136/78 | HR 107 | Temp 98.7°F | Ht 66.0 in | Wt 350.0 lb

## 2018-05-02 DIAGNOSIS — L0291 Cutaneous abscess, unspecified: Secondary | ICD-10-CM | POA: Diagnosis not present

## 2018-05-02 MED ORDER — MUPIROCIN CALCIUM 2 % NA OINT: 1.0000 "application " | TOPICAL_OINTMENT | Freq: Two times a day (BID) | NASAL | 0 refills | Status: DC

## 2018-05-02 MED ORDER — SULFAMETHOXAZOLE-TRIMETHOPRIM 800-160 MG PO TABS: 1.0000 | ORAL_TABLET | Freq: Two times a day (BID) | ORAL | 0 refills | Status: DC

## 2018-05-02 NOTE — Progress Notes (Signed)
Tiffany Conley is a 37 y.o. female with the following history as recorded in EpicCare:  Patient Active Problem List   Diagnosis Date Noted  . Mild persistent asthma, poorly controlled 04/14/2018  . Cigarette smoker 04/14/2018  . Dyspnea on exertion   . Bronchitis 03/21/2018  . Acute lateral meniscus tear of left knee 08/23/2017  . Diarrhea 06/01/2017  . Nausea with vomiting 04/18/2017  . Low back pain 04/18/2017  . Occipital neuralgia of right side 04/27/2016  . Common migraine without intractability 04/27/2016  . Neck pain 04/27/2016  . 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 INTERNAL DERANGEMENT, RIGHT KNEE 09/08/2010  . Lateral meniscus tear 09/08/2010    Current Outpatient Medications  Medication Sig Dispense Refill  . albuterol (PROAIR HFA) 108 (90 Base) MCG/ACT inhaler 2 puffs every 4 hours as needed only  if your can't catch your breath 1 Inhaler 11  . famotidine (PEPCID) 20 MG tablet One at bedtime 30 tablet 11  . pantoprazole (PROTONIX) 40 MG tablet Take 1 tablet (40 mg total) by mouth daily. Take 30-60 min before first meal of the day 30 tablet 2  . budesonide-formoterol (SYMBICORT) 80-4.5 MCG/ACT inhaler Take 2 puffs first thing in am and then another 2 puffs about 12 hours later. (Patient not taking: Reported on 05/02/2018) 1 Inhaler 12  . chlorpheniramine-HYDROcodone (TUSSIONEX PENNKINETIC ER) 10-8 MG/5ML SUER Take 5 mLs by mouth every 12 (twelve) hours as needed for cough. (Patient not taking: Reported on 05/02/2018) 100 mL 0  . mupirocin nasal ointment (BACTROBAN NASAL) 2 % Place 1 application into the nose 2 (two) times daily. Use one-half of tube in each nostril twice daily for five (5) days. 10 g 0  . sulfamethoxazole-trimethoprim (BACTRIM DS,SEPTRA DS) 800-160 MG tablet Take 1 tablet by mouth 2 (two) times daily. 10 tablet 0   No current facility-administered medications for this visit.     Allergies: Citrus; Ibuprofen; and Vicodin  [hydrocodone-acetaminophen]  Past Medical History:  Diagnosis Date  . Headache   . Sciatic nerve pain   . UTI (lower urinary tract infection)     Past Surgical History:  Procedure Laterality Date  . APPENDECTOMY    . CHOLECYSTECTOMY    . ENDOMETRIAL ABLATION    . KNEE ARTHROSCOPY    . SHOULDER SURGERY Left   . TONSILLECTOMY      Family History  Problem Relation Age of Onset  . Colon cancer Maternal Grandmother   . Healthy Mother   . Congestive Heart Failure Father     Social History   Tobacco Use  . Smoking status: Current Every Day Smoker    Packs/day: 0.50    Years: 10.00    Pack years: 5.00    Types: Cigarettes  . Smokeless tobacco: Never Used  Substance Use Topics  . Alcohol use: No    Subjective:  Patient presents with concerns for persistent/ recurrent abscesses; had to see surgeon approximately 2 weeks ago for large abscess; worried about small abscesses that continue "to pop up." No fever; areas of concern include axillary region/ left lower abdomen; completed Doxycycline that I had prescribed- no further antibiotics given by surgeon and no culture done by surgeon;   Objective:  Vitals:   05/02/18 1042  BP: 136/78  Pulse: (!) 107  Temp: 98.7 F (37.1 C)  TempSrc: Oral  SpO2: 99%  Weight: (!) 350 lb 0.6 oz (158.8 kg)  Height: 5\' 6"  (1.676 m)    General: Well developed, well nourished,  in no acute distress  Skin : Warm and dry.  Head: Normocephalic and atraumatic  Lungs: Respirations unlabored; Neurologic: Alert and oriented; speech intact; face symmetrical; moves all extremities well; CNII-XII intact without focal deficit  Assessment:  1. Abscess     Plan:  Trial of Bactrim x 10 days/ Bactroban nasal and HIBA cleanse; if symptoms persist, to consider referral to ID and/or back to general surgery; follow-up to be determined.   No follow-ups on file.  No orders of the defined types were placed in this encounter.   Requested Prescriptions   Signed  Prescriptions Disp Refills  . sulfamethoxazole-trimethoprim (BACTRIM DS,SEPTRA DS) 800-160 MG tablet 10 tablet 0    Sig: Take 1 tablet by mouth 2 (two) times daily.  . mupirocin nasal ointment (BACTROBAN NASAL) 2 % 10 g 0    Sig: Place 1 application into the nose 2 (two) times daily. Use one-half of tube in each nostril twice daily for five (5) days.

## 2018-05-02 NOTE — Patient Instructions (Signed)
You can purchase HIBA Cleanse to wash the body- use 3 x per week;

## 2018-05-18 ENCOUNTER — Ambulatory Visit: Payer: 59 | Admitting: Internal Medicine

## 2018-07-26 ENCOUNTER — Other Ambulatory Visit: Payer: Self-pay | Admitting: Family

## 2018-07-26 ENCOUNTER — Encounter: Payer: Self-pay | Admitting: Family

## 2018-07-26 ENCOUNTER — Ambulatory Visit (INDEPENDENT_AMBULATORY_CARE_PROVIDER_SITE_OTHER): Payer: 59 | Admitting: Family

## 2018-07-26 VITALS — BP 140/88 | HR 99 | Temp 99.1°F | Ht 66.0 in | Wt 350.0 lb

## 2018-07-26 DIAGNOSIS — J019 Acute sinusitis, unspecified: Secondary | ICD-10-CM

## 2018-07-26 MED ORDER — HYDROCOD POLST-CPM POLST ER 10-8 MG/5ML PO SUER: 5.0000 mL | Freq: Two times a day (BID) | ORAL | 0 refills | Status: DC | PRN

## 2018-07-26 MED ORDER — DOXYCYCLINE HYCLATE 100 MG PO TABS: 100.0000 mg | ORAL_TABLET | Freq: Two times a day (BID) | ORAL | 0 refills | Status: DC

## 2018-07-26 NOTE — Telephone Encounter (Signed)
Her [facility] cannot get the Tussionex; does she have another pharmacy she would like us to use?

## 2018-07-26 NOTE — Progress Notes (Signed)
Tiffany Conley is a 37 y.o. female with the following history as recorded in EpicCare:  Patient Active Problem List   Diagnosis Date Noted  . Mild persistent asthma, poorly controlled 04/14/2018  . Cigarette smoker 04/14/2018  . Dyspnea on exertion   . Bronchitis 03/21/2018  . Acute lateral meniscus tear of left knee 08/23/2017  . Diarrhea 06/01/2017  . Nausea with vomiting 04/18/2017  . Low back pain 04/18/2017  . Occipital neuralgia of right side 04/27/2016  . Common migraine without intractability 04/27/2016  . Neck pain 04/27/2016  . 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 INTERNAL DERANGEMENT, RIGHT KNEE 09/08/2010  . Lateral meniscus tear 09/08/2010    Current Outpatient Medications  Medication Sig Dispense Refill  . albuterol (PROAIR HFA) 108 (90 Base) MCG/ACT inhaler 2 puffs every 4 hours as needed only  if your can't catch your breath 1 Inhaler 11  . budesonide-formoterol (SYMBICORT) 80-4.5 MCG/ACT inhaler Take 2 puffs first thing in am and then another 2 puffs about 12 hours later. 1 Inhaler 12  . chlorpheniramine-HYDROcodone (TUSSIONEX PENNKINETIC ER) 10-8 MG/5ML SUER Take 5 mLs by mouth every 12 (twelve) hours as needed for cough. 100 mL 0  . doxycycline (VIBRA-TABS) 100 MG tablet Take 1 tablet (100 mg total) by mouth 2 (two) times daily. 20 tablet 0   No current facility-administered medications for this visit.     Allergies: Citrus; Ibuprofen; and Vicodin [hydrocodone-acetaminophen]  Past Medical History:  Diagnosis Date  . Headache   . Sciatic nerve pain   . UTI (lower urinary tract infection)     Past Surgical History:  Procedure Laterality Date  . APPENDECTOMY    . CHOLECYSTECTOMY    . ENDOMETRIAL ABLATION    . KNEE ARTHROSCOPY    . SHOULDER SURGERY Left   . TONSILLECTOMY      Family History  Problem Relation Age of Onset  . Colon cancer Maternal Grandmother   . Healthy Mother   . Congestive Heart Failure Father      Social History   Tobacco Use  . Smoking status: Current Every Day Smoker    Packs/day: 0.50    Years: 10.00    Pack years: 5.00    Types: Cigarettes  . Smokeless tobacco: Never Used  Substance Use Topics  . Alcohol use: No    Subjective:  Patient presents with concerns with 5 day history of cough/ congestion; + sore throat; + headache;  Feels like breathing is "okay." Fever was up to 101 on Saturday- "felt like I had been hit by a bus when symptoms started." Using OTC Thera-flu, Mucinex, Tussionex; right ear is painful;     Objective:  Vitals:   07/26/18 0902  BP: 140/88  Pulse: 99  Temp: 99.1 F (37.3 C)  TempSrc: Oral  SpO2: 98%  Weight: (!) 350 lb (158.8 kg)  Height: 5\' 6"  (1.676 m)    General: Well developed, well nourished, in no acute distress  Skin : Warm and dry.  Head: Normocephalic and atraumatic  Eyes: Sclera and conjunctiva clear; pupils round and reactive to light; extraocular movements intact  Ears: External normal; canals clear; tympanic membranes congested/ mildly erythematous Oropharynx: Pink, supple. No suspicious lesions  Neck: Supple without thyromegaly, adenopathy  Lungs: Respirations unlabored; clear to auscultation bilaterally without wheeze, rales, rhonchi  CVS exam: normal rate and regular rhythm.  Neurologic: Alert and oriented; speech intact; face symmetrical; moves all extremities well; CNII-XII intact without focal deficit   Assessment:  1. Acute sinusitis, recurrence not specified, unspecified location     Plan:  Rx for Doxycycline 100 mg bid x 10 days, re-start Flonase; continue Symbicort as prescribed; Rx for Tussionex to use at night; increase fluids, rest and follow-up worse, no better. Work note given as requested;  Encouraged to return for CPE/ pap smear;   No follow-ups on file.  No orders of the defined types were placed in this encounter.   Requested Prescriptions   Signed Prescriptions Disp Refills  . doxycycline  (VIBRA-TABS) 100 MG tablet 20 tablet 0    Sig: Take 1 tablet (100 mg total) by mouth 2 (two) times daily.  . chlorpheniramine-HYDROcodone (TUSSIONEX PENNKINETIC ER) 10-8 MG/5ML SUER 100 mL 0    Sig: Take 5 mLs by mouth every 12 (twelve) hours as needed for cough.

## 2018-07-27 NOTE — Telephone Encounter (Signed)
Called and left message for patient to return call to clinic to let me know what pharmacy she would like medication sent in to. Also sent her a my-chart message as well.

## 2018-07-28 NOTE — Telephone Encounter (Signed)
Sent patient a second message today following up on request for alternative pharmacy she would like to use.

## 2018-07-31 NOTE — Telephone Encounter (Signed)
Can she provide more information as to what she is having as far as upset stomach? It could be related to the antibiotic- we may need to change that for her.

## 2018-08-01 NOTE — Telephone Encounter (Signed)
Ask her to hold the antibiotic for now- these are pretty common side effects of this medication. They could definitely be making the GI symptoms worse.  How are the sinus symptoms now?

## 2018-08-02 ENCOUNTER — Other Ambulatory Visit: Payer: Self-pay | Admitting: Family

## 2018-08-02 MED ORDER — LEVOFLOXACIN 750 MG PO TABS: 750.0000 mg | ORAL_TABLET | Freq: Every day | ORAL | 0 refills | Status: DC

## 2018-08-02 NOTE — Telephone Encounter (Signed)
(  FYI) Please see patient's response.  Thanks

## 2018-08-02 NOTE — Telephone Encounter (Signed)
Please advise.  Does she need to be seen?

## 2018-10-03 ENCOUNTER — Other Ambulatory Visit: Payer: 59

## 2018-10-03 ENCOUNTER — Encounter: Payer: Self-pay | Admitting: Family

## 2018-10-03 ENCOUNTER — Ambulatory Visit (INDEPENDENT_AMBULATORY_CARE_PROVIDER_SITE_OTHER): Payer: 59 | Admitting: Family

## 2018-10-03 VITALS — BP 150/92 | HR 96 | Temp 99.1°F | Ht 66.0 in | Wt 340.1 lb

## 2018-10-03 DIAGNOSIS — R6889 Other general symptoms and signs: Secondary | ICD-10-CM

## 2018-10-03 DIAGNOSIS — R300 Dysuria: Secondary | ICD-10-CM | POA: Diagnosis not present

## 2018-10-03 LAB — POC URINALSYSI DIPSTICK (AUTOMATED)
Bilirubin, UA: NEGATIVE
Blood, UA: NEGATIVE
Glucose, UA: NEGATIVE
KETONES UA: NEGATIVE
LEUKOCYTES UA: NEGATIVE
NITRITE UA: NEGATIVE
PH UA: 6 (ref 5.0–8.0)
PROTEIN UA: NEGATIVE
Spec Grav, UA: 1.02 (ref 1.010–1.025)
Urobilinogen, UA: 0.2 E.U./dL

## 2018-10-03 MED ORDER — OSELTAMIVIR PHOSPHATE 75 MG PO CAPS: 75.0000 mg | ORAL_CAPSULE | Freq: Two times a day (BID) | ORAL | 0 refills | Status: DC

## 2018-10-03 NOTE — Progress Notes (Signed)
Tiffany Conley is a 38 y.o. female with the following history as recorded in EpicCare:  Patient Active Problem List   Diagnosis Date Noted  . Mild persistent asthma, poorly controlled 04/14/2018  . Cigarette smoker 04/14/2018  . Dyspnea on exertion   . Bronchitis 03/21/2018  . Acute lateral meniscus tear of left knee 08/23/2017  . Diarrhea 06/01/2017  . Nausea with vomiting 04/18/2017  . Low back pain 04/18/2017  . Occipital neuralgia of right side 04/27/2016  . Common migraine without intractability 04/27/2016  . Neck pain 04/27/2016  . 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 INTERNAL DERANGEMENT, RIGHT KNEE 09/08/2010  . Lateral meniscus tear 09/08/2010    Current Outpatient Medications  Medication Sig Dispense Refill  . albuterol (PROAIR HFA) 108 (90 Base) MCG/ACT inhaler 2 puffs every 4 hours as needed only  if your can't catch your breath 1 Inhaler 11  . budesonide-formoterol (SYMBICORT) 80-4.5 MCG/ACT inhaler Take 2 puffs first thing in am and then another 2 puffs about 12 hours later. 1 Inhaler 12  . oseltamivir (TAMIFLU) 75 MG capsule Take 1 capsule (75 mg total) by mouth 2 (two) times daily. 10 capsule 0   No current facility-administered medications for this visit.     Allergies: Citrus; Ibuprofen; and Vicodin [hydrocodone-acetaminophen]  Past Medical History:  Diagnosis Date  . Headache   . Sciatic nerve pain   . UTI (lower urinary tract infection)     Past Surgical History:  Procedure Laterality Date  . APPENDECTOMY    . CHOLECYSTECTOMY    . ENDOMETRIAL ABLATION    . KNEE ARTHROSCOPY    . SHOULDER SURGERY Left   . TONSILLECTOMY      Family History  Problem Relation Age of Onset  . Colon cancer Maternal Grandmother   . Healthy Mother   . Congestive Heart Failure Father     Social History   Tobacco Use  . Smoking status: Current Every Day Smoker    Packs/day: 0.50    Years: 10.00    Pack years: 5.00    Types: Cigarettes   . Smokeless tobacco: Never Used  Substance Use Topics  . Alcohol use: No    Subjective:  Started Sunday evening with "freezing"/ woke up Monday feeling "like she has been hit by a bus." + headache, nausea, diarrhea/ upset stomach; felt like she woke up "soaking wet this morning." No chest pain or shortness of breath; co-worker who is a close friend has recently been diagnosed with flu as well.   Also wanted to get her urine checked to make sure she does not have UTI; had been experiencing LBP x 1 week; no burning on urination, no frequency;     Objective:  Vitals:   10/03/18 0946  BP: (!) 150/92  Pulse: 96  Temp: 99.1 F (37.3 C)  TempSrc: Oral  SpO2: 97%  Weight: (!) 340 lb 1.9 oz (154.3 kg)  Height: 5\' 6"  (1.676 m)    General: Well developed, well nourished, in no acute distress  Skin : Warm and dry.  Head: Normocephalic and atraumatic  Eyes: Sclera and conjunctiva clear; pupils round and reactive to light; extraocular movements intact  Ears: External normal; canals clear; tympanic membranes normal  Oropharynx: Pink, supple. No suspicious lesions  Neck: Supple without thyromegaly, adenopathy  Lungs: Respirations unlabored; clear to auscultation bilaterally without wheeze, rales, rhonchi  CVS exam: normal rate and regular rhythm.  Vessels: Symmetric bilaterally  Neurologic: Alert and oriented; speech intact; face  symmetrical; moves all extremities well; CNII-XII intact without focal deficit   Assessment:  1. Flu-like symptoms   2. Dysuria     Plan:  1. Rapid flu is negative; however, based on patient's appearance in the office, will go ahead and treat for flu; Rx for Tamiflu 75 mg bid x 5 days; increase fluids, rest, symptomatic treatment; work note for next 2 days as requested. 2. U/A not remarkable; check urine culture; follow-up to be determined.   Suspect blood pressure is elevated due to illness; patient is planning to follow-up for pap smear- will re-check at next  OV.  No follow-ups on file.  Orders Placed This Encounter  Procedures  . Urine Culture    Standing Status:   Future    Standing Expiration Date:   10/03/2019  . POCT Urinalysis Dipstick (Automated)    Requested Prescriptions   Signed Prescriptions Disp Refills  . oseltamivir (TAMIFLU) 75 MG capsule 10 capsule 0    Sig: Take 1 capsule (75 mg total) by mouth 2 (two) times daily.

## 2018-10-04 LAB — URINE CULTURE
MICRO NUMBER: 115301
SPECIMEN QUALITY:: ADEQUATE

## 2018-10-09 ENCOUNTER — Telehealth: Payer: Self-pay | Admitting: Family

## 2018-10-09 NOTE — Telephone Encounter (Signed)
Copied from CRM [id number]. Topic: General - Inquiry >> Oct 09, 2018  2:39 PM Maia Petties wrote: Reason for CRM: pt called asking if her work note can be extended to return to work today 10/09/2018. She is also requested to have extended breaks for an additional 30 minutes per day thru this week. 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 She doesn't want to get in trouble at work. Please advise. Fax # [phone] Attn: Marchelle Folks

## 2018-10-11 ENCOUNTER — Other Ambulatory Visit: Payer: Self-pay | Admitting: Family

## 2018-10-11 ENCOUNTER — Telehealth: Payer: Self-pay | Admitting: Family

## 2018-10-11 NOTE — Telephone Encounter (Signed)
Will put letter on your desk; thanks-  Fax # [phone] Marchelle Folks

## 2018-10-11 NOTE — Telephone Encounter (Signed)
Note faxed today for patient

## 2018-10-11 NOTE — Telephone Encounter (Signed)
Note faxed and patient notified.

## 2018-10-11 NOTE — Telephone Encounter (Signed)
lw 

## 2018-11-10 ENCOUNTER — Encounter: Payer: Self-pay | Admitting: Family

## 2018-11-10 ENCOUNTER — Ambulatory Visit (INDEPENDENT_AMBULATORY_CARE_PROVIDER_SITE_OTHER): Payer: 59 | Admitting: Family

## 2018-11-10 VITALS — BP 142/78 | HR 81 | Temp 98.6°F | Ht 66.0 in | Wt 346.0 lb

## 2018-11-10 DIAGNOSIS — L732 Hidradenitis suppurativa: Secondary | ICD-10-CM

## 2018-11-10 DIAGNOSIS — J019 Acute sinusitis, unspecified: Secondary | ICD-10-CM | POA: Diagnosis not present

## 2018-11-10 MED ORDER — FLUTICASONE PROPIONATE 50 MCG/ACT NA SUSP: 2.0000 | Freq: Every day | NASAL | 6 refills | Status: DC

## 2018-11-10 MED ORDER — AMOXICILLIN-POT CLAVULANATE 875-125 MG PO TABS: 1.0000 | ORAL_TABLET | Freq: Two times a day (BID) | ORAL | 0 refills | Status: DC

## 2018-11-10 NOTE — Progress Notes (Signed)
Tiffany Conley is a 38 y.o. female with the following history as recorded in EpicCare:  Patient Active Problem List   Diagnosis Date Noted  . Mild persistent asthma, poorly controlled 04/14/2018  . Cigarette smoker 04/14/2018  . Dyspnea on exertion   . Bronchitis 03/21/2018  . Acute lateral meniscus tear of left knee 08/23/2017  . Diarrhea 06/01/2017  . Nausea with vomiting 04/18/2017  . Low back pain 04/18/2017  . Occipital neuralgia of right side 04/27/2016  . Common migraine without intractability 04/27/2016  . Neck pain 04/27/2016  . 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 INTERNAL DERANGEMENT, RIGHT KNEE 09/08/2010  . Lateral meniscus tear 09/08/2010    Current Outpatient Medications  Medication Sig Dispense Refill  . albuterol (PROAIR HFA) 108 (90 Base) MCG/ACT inhaler 2 puffs every 4 hours as needed only  if your can't catch your breath 1 Inhaler 11  . budesonide-formoterol (SYMBICORT) 80-4.5 MCG/ACT inhaler Take 2 puffs first thing in am and then another 2 puffs about 12 hours later. 1 Inhaler 12  . amoxicillin-clavulanate (AUGMENTIN) 875-125 MG tablet Take 1 tablet by mouth 2 (two) times daily. 20 tablet 0  . fluticasone (FLONASE) 50 MCG/ACT nasal spray Place 2 sprays into both nostrils daily. 16 g 6   No current facility-administered medications for this visit.     Allergies: Citrus; Ibuprofen; and Vicodin [hydrocodone-acetaminophen]  Past Medical History:  Diagnosis Date  . Headache   . Sciatic nerve pain   . UTI (lower urinary tract infection)     Past Surgical History:  Procedure Laterality Date  . APPENDECTOMY    . CHOLECYSTECTOMY    . ENDOMETRIAL ABLATION    . KNEE ARTHROSCOPY    . SHOULDER SURGERY Left   . TONSILLECTOMY      Family History  Problem Relation Age of Onset  . Colon cancer Maternal Grandmother   . Healthy Mother   . Congestive Heart Failure Father     Social History   Tobacco Use  . Smoking status:  Current Every Day Smoker    Packs/day: 0.50    Years: 10.00    Pack years: 5.00    Types: Cigarettes  . Smokeless tobacco: Never Used  Substance Use Topics  . Alcohol use: No    Subjective:  Seen at end of January with suspected flu; now presents with concerns for secondary sinus infection; + cough/ congestion- using her inhaler however and not wheezing.  +metallic taste in mouth; no fever; has been using OTC Sudafed with limited benefit;   Objective:  Vitals:   11/10/18 0920  BP: (!) 142/78  Pulse: 81  Temp: 98.6 F (37 C)  TempSrc: Oral  SpO2: 99%  Weight: (!) 346 lb (156.9 kg)  Height: 5\' 6"  (1.676 m)    General: Well developed, well nourished, in no acute distress  Skin : Warm and dry.  Head: Normocephalic and atraumatic  Eyes: Sclera and conjunctiva clear; pupils round and reactive to light; extraocular movements intact  Ears: External normal; canals clear; tympanic membranes normal  Oropharynx: Pink, supple. No suspicious lesions  Neck: Supple without thyromegaly, adenopathy  Lungs: Respirations unlabored; clear to auscultation bilaterally without wheeze, rales, rhonchi  CVS exam: normal rate and regular rhythm.  Neurologic: Alert and oriented; speech intact; face symmetrical; moves all extremities well; CNII-XII intact without focal deficit   Assessment:  1. Acute sinusitis, recurrence not specified, unspecified location   2. Hidradenitis axillaris     Plan:  1. Rx  for Augmentin 875 mg bid x 10 days, Flonase NS; increase fluids, rest; need to quit smoking; work note for yesterday and today; 2. Refer to surgeon for further evaluation;  Is scheduled to establish with GYN later this month- history of endometriosis.    No follow-ups on file.  Orders Placed This Encounter  Procedures  . Ambulatory referral to General Surgery    Referral Priority:   Routine    Referral Type:   Surgical    Referral Reason:   Specialty Services Required    Requested Specialty:    General Surgery    Number of Visits Requested:   1    Requested Prescriptions   Signed Prescriptions Disp Refills  . amoxicillin-clavulanate (AUGMENTIN) 875-125 MG tablet 20 tablet 0    Sig: Take 1 tablet by mouth 2 (two) times daily.  . fluticasone (FLONASE) 50 MCG/ACT nasal spray 16 g 6    Sig: Place 2 sprays into both nostrils daily.

## 2018-11-29 DIAGNOSIS — N920 Excessive and frequent menstruation with regular cycle: Secondary | ICD-10-CM | POA: Diagnosis not present

## 2018-11-29 DIAGNOSIS — Z01419 Encounter for gynecological examination (general) (routine) without abnormal findings: Secondary | ICD-10-CM | POA: Diagnosis not present

## 2018-11-29 DIAGNOSIS — N803 Endometriosis of pelvic peritoneum: Secondary | ICD-10-CM | POA: Diagnosis not present

## 2018-11-29 DIAGNOSIS — Z124 Encounter for screening for malignant neoplasm of cervix: Secondary | ICD-10-CM | POA: Diagnosis not present

## 2018-11-29 DIAGNOSIS — Z6841 Body Mass Index (BMI) 40.0 and over, adult: Secondary | ICD-10-CM | POA: Diagnosis not present

## 2019-01-10 DIAGNOSIS — Z30430 Encounter for insertion of intrauterine contraceptive device: Secondary | ICD-10-CM | POA: Diagnosis not present

## 2019-01-10 DIAGNOSIS — R102 Pelvic and perineal pain: Secondary | ICD-10-CM | POA: Diagnosis not present

## 2019-01-10 DIAGNOSIS — N803 Endometriosis of pelvic peritoneum: Secondary | ICD-10-CM | POA: Diagnosis not present

## 2019-01-15 DIAGNOSIS — Z30431 Encounter for routine checking of intrauterine contraceptive device: Secondary | ICD-10-CM | POA: Diagnosis not present

## 2019-01-15 DIAGNOSIS — N939 Abnormal uterine and vaginal bleeding, unspecified: Secondary | ICD-10-CM | POA: Diagnosis not present

## 2019-01-15 DIAGNOSIS — R102 Pelvic and perineal pain: Secondary | ICD-10-CM | POA: Diagnosis not present

## 2019-03-21 ENCOUNTER — Encounter: Payer: Self-pay | Admitting: Obstetrics and Gynecology

## 2019-06-18 ENCOUNTER — Encounter: Payer: Self-pay | Admitting: Family

## 2019-06-18 ENCOUNTER — Ambulatory Visit (INDEPENDENT_AMBULATORY_CARE_PROVIDER_SITE_OTHER): Payer: 59 | Admitting: Family

## 2019-06-18 DIAGNOSIS — J45909 Unspecified asthma, uncomplicated: Secondary | ICD-10-CM | POA: Diagnosis not present

## 2019-06-18 MED ORDER — PREDNISONE 20 MG PO TABS: 40.0000 mg | ORAL_TABLET | Freq: Every day | ORAL | 0 refills | Status: DC

## 2019-06-18 MED ORDER — AMOXICILLIN-POT CLAVULANATE 875-125 MG PO TABS: 1.0000 | ORAL_TABLET | Freq: Two times a day (BID) | ORAL | 0 refills | Status: DC

## 2019-06-18 NOTE — Progress Notes (Signed)
Tiffany Conley is a 38 y.o. female with the following history as recorded in EpicCare:  Patient Active Problem List   Diagnosis Date Noted  . Mild persistent asthma, poorly controlled 04/14/2018  . Cigarette smoker 04/14/2018  . Dyspnea on exertion   . Bronchitis 03/21/2018  . Acute lateral meniscus tear of left knee 08/23/2017  . Diarrhea 06/01/2017  . Nausea with vomiting 04/18/2017  . Low back pain 04/18/2017  . Occipital neuralgia of right side 04/27/2016  . Common migraine without intractability 04/27/2016  . Neck pain 04/27/2016  . 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 INTERNAL DERANGEMENT, RIGHT KNEE 09/08/2010  . Lateral meniscus tear 09/08/2010    Current Outpatient Medications  Medication Sig Dispense Refill  . albuterol (PROAIR HFA) 108 (90 Base) MCG/ACT inhaler 2 puffs every 4 hours as needed only  if your can't catch your breath 1 Inhaler 11  . budesonide-formoterol (SYMBICORT) 80-4.5 MCG/ACT inhaler Take 2 puffs first thing in am and then another 2 puffs about 12 hours later. 1 Inhaler 12  . fluticasone (FLONASE) 50 MCG/ACT nasal spray Place 2 sprays into both nostrils daily. 16 g 6  . levonorgestrel (MIRENA) 20 MCG/24HR IUD 1 each by Intrauterine route once. Placed June 2020    . amoxicillin-clavulanate (AUGMENTIN) 875-125 MG tablet Take 1 tablet by mouth 2 (two) times daily. 20 tablet 0  . predniSONE (DELTASONE) 20 MG tablet Take 2 tablets (40 mg total) by mouth daily with breakfast. 10 tablet 0   No current facility-administered medications for this visit.     Allergies: Citrus, Ibuprofen, and Vicodin [hydrocodone-acetaminophen]  Past Medical History:  Diagnosis Date  . Headache   . Sciatic nerve pain   . UTI (lower urinary tract infection)     Past Surgical History:  Procedure Laterality Date  . APPENDECTOMY    . CHOLECYSTECTOMY    . ENDOMETRIAL ABLATION    . KNEE ARTHROSCOPY    . SHOULDER SURGERY Left   . TONSILLECTOMY       Family History  Problem Relation Age of Onset  . Colon cancer Maternal Grandmother   . Healthy Mother   . Congestive Heart Failure Father     Social History   Tobacco Use  . Smoking status: Current Every Day Smoker    Packs/day: 0.50    Years: 10.00    Pack years: 5.00    Types: Cigarettes  . Smokeless tobacco: Never Used  Substance Use Topics  . Alcohol use: No    Subjective:    I connected with [name] on 06/18/19 at  9:40 AM EDT by a telephone call  and verified that I am speaking with the correct person using two identifiers. Provider in office and patient is at home.    I discussed the limitations of evaluation and management by telemedicine and the availability of in person appointments. The patient expressed understanding and agreed to proceed.  Concerned with possible sinus infection/ bronchitis; symptoms x 5 days; is using her Symbicort and Flonase regularly; having to use her rescue inhaler more in the past 24 hours; + wheezing; no fever or shortness of breath; Requesting work note for today and tomorrow;  Up to date on GYN needs- went earlier this summer; now has IUD in place;      Objective:  There were no vitals filed for this visit.  Lungs: Respirations unlabored;  Neurologic: Alert and oriented; speech intact; face symmetrical;   Assessment:  1. Acute asthmatic bronchitis  Plan:  Rx for Augmentin, Prednisone; continue Flonase and Symbicort; increase fluids, rest and follow up worse, no better; Work note provided as requested.    Time spent 12 minutes  No follow-ups on file.  No orders of the defined types were placed in this encounter.   Requested Prescriptions   Signed Prescriptions Disp Refills  . amoxicillin-clavulanate (AUGMENTIN) 875-125 MG tablet 20 tablet 0    Sig: Take 1 tablet by mouth 2 (two) times daily.  . predniSONE (DELTASONE) 20 MG tablet 10 tablet 0    Sig: Take 2 tablets (40 mg total) by mouth daily with  breakfast.

## 2019-06-22 ENCOUNTER — Telehealth: Payer: Self-pay

## 2019-06-22 ENCOUNTER — Other Ambulatory Visit: Payer: Self-pay | Admitting: Family

## 2019-06-22 NOTE — Telephone Encounter (Signed)
Copied from [place] [id number]. Topic: General - Inquiry >> Jun 22, 2019  8:42 AM Sheran Luz wrote: Patient calling to inquire if she can get work note extended to today, as she is still not feeling well. She states her cough is getting better but she is not 100%. She is requesting a call back to discuss, if needed. She states one was e-mailed to her on Monday and requesting e-mailed today.

## 2019-06-25 NOTE — Telephone Encounter (Signed)
I called pt- PCP is gone for the day. She has been taking Naproxen with no relief. She states she also took a Flexeril at night and woke up with no pain, but as soon as she sat up in bed her headache returned.  I informed her to try Tylenol as directed if she can tolerate it and if her sxs worsen to go to UC this evening.   She states she has had headaches like this in the past approx 2 years ago and the tx she received then was an injection in the back of her head. Please advise anything further.

## 2019-06-25 NOTE — Telephone Encounter (Signed)
Patient is checking status on if PCP will send her anything due to her headache. Patient is also now requesting PCP nurse to give a call back. Call back [phone]

## 2019-06-25 NOTE — Telephone Encounter (Signed)
Pt states she still not feeling well, has the same headache, right side of head, hurts to the touch. Pt states her right eye is sore, and head is tender to the touch. Would like to know if she should come in to be seen?  Pt states she has not had a headache like this is in a long time.  Please advise.

## 2019-06-26 ENCOUNTER — Telehealth: Payer: Self-pay

## 2019-06-26 NOTE — Telephone Encounter (Signed)
Copied from [place] [phone]. Topic: General - Call Back - No Documentation >> Jun 26, 2019  1:22 PM Erick Blinks wrote: Reason for CRM: Pt is requesting call back from Jodi Mourning regarding headache. Pt is seeking medication, Also pt is requesting a work note for Saturday monday and today Best contact: [phone] >> Jun 26, 2019  1:33 PM Morey Hummingbird wrote: Would you like virtual scheduled?

## 2019-06-26 NOTE — Telephone Encounter (Signed)
Yes, she needs some type of appointment.

## 2019-06-26 NOTE — Telephone Encounter (Signed)
LVM for patient to call back to make an appointment.

## 2019-06-26 NOTE — Telephone Encounter (Signed)
Has been told to schedule OV

## 2019-06-27 ENCOUNTER — Ambulatory Visit (INDEPENDENT_AMBULATORY_CARE_PROVIDER_SITE_OTHER): Payer: 59 | Admitting: Family

## 2019-06-27 ENCOUNTER — Encounter: Payer: Self-pay | Admitting: Family

## 2019-06-27 ENCOUNTER — Other Ambulatory Visit: Payer: Self-pay

## 2019-06-27 VITALS — BP 140/78 | HR 106 | Temp 98.7°F | Ht 66.0 in | Wt 344.0 lb

## 2019-06-27 DIAGNOSIS — Z6841 Body Mass Index (BMI) 40.0 and over, adult: Secondary | ICD-10-CM | POA: Diagnosis not present

## 2019-06-27 DIAGNOSIS — R519 Headache, unspecified: Secondary | ICD-10-CM | POA: Diagnosis not present

## 2019-06-27 DIAGNOSIS — Z720 Tobacco use: Secondary | ICD-10-CM

## 2019-06-27 MED ORDER — KETOROLAC TROMETHAMINE 30 MG/ML IJ SOLN
30.0000 mg | Freq: Once | INTRAMUSCULAR | Status: AC
  Administered 2019-06-27: 30 mg via INTRAMUSCULAR

## 2019-06-27 MED ORDER — CHANTIX STARTING MONTH PAK 0.5 MG X 11 & 1 MG X 42 PO TABS
ORAL_TABLET | ORAL | 0 refills | Status: DC
Start: 2019-06-27 — End: 2019-07-23

## 2019-06-27 NOTE — Progress Notes (Signed)
Tiffany Conley is a 38 y.o. female with the following history as recorded in EpicCare:  Patient Active Problem List   Diagnosis Date Noted  . Body mass index (BMI) 50.0-59.9, adult ([employer]) 06/27/2019  . Mild persistent asthma, poorly controlled 04/14/2018  . Cigarette smoker 04/14/2018  . Dyspnea on exertion   . Bronchitis 03/21/2018  . Acute lateral meniscus tear of left knee 08/23/2017  . Diarrhea 06/01/2017  . Nausea with vomiting 04/18/2017  . Low back pain 04/18/2017  . Occipital neuralgia of right side 04/27/2016  . Common migraine without intractability 04/27/2016  . Neck pain 04/27/2016  . 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 INTERNAL DERANGEMENT, RIGHT KNEE 09/08/2010  . Lateral meniscus tear 09/08/2010    Current Outpatient Medications  Medication Sig Dispense Refill  . albuterol (PROAIR HFA) 108 (90 Base) MCG/ACT inhaler 2 puffs every 4 hours as needed only  if your can't catch your breath 1 Inhaler 11  . budesonide-formoterol (SYMBICORT) 80-4.5 MCG/ACT inhaler Take 2 puffs first thing in am and then another 2 puffs about 12 hours later. 1 Inhaler 12  . fluticasone (FLONASE) 50 MCG/ACT nasal spray Place 2 sprays into both nostrils daily. 16 g 6  . levonorgestrel (MIRENA) 20 MCG/24HR IUD 1 each by Intrauterine route once. Placed June 2020    . varenicline (CHANTIX STARTING MONTH PAK) 0.5 MG X 11 & 1 MG X 42 tablet Take one 0.5 mg tablet by mouth once daily for 3 days, then increase to one 0.5 mg tablet twice daily for 4 days, then increase to one 1 mg tablet twice daily. 53 tablet 0   No current facility-administered medications for this visit.     Allergies: Citrus, Ibuprofen, and Vicodin [hydrocodone-acetaminophen]  Past Medical History:  Diagnosis Date  . Headache   . Sciatic nerve pain   . UTI (lower urinary tract infection)     Past Surgical History:  Procedure Laterality Date  . APPENDECTOMY    . CHOLECYSTECTOMY    . ENDOMETRIAL  ABLATION    . KNEE ARTHROSCOPY    . SHOULDER SURGERY Left   . TONSILLECTOMY      Family History  Problem Relation Age of Onset  . Colon cancer Maternal Grandmother   . Healthy Mother   . Congestive Heart Failure Father     Social History   Tobacco Use  . Smoking status: Current Every Day Smoker    Packs/day: 0.50    Years: 10.00    Pack years: 5.00    Types: Cigarettes  . Smokeless tobacco: Never Used  Substance Use Topics  . Alcohol use: No    Subjective:  Follow-up on suspect migraine from recent sinus infection; notes that she is feeling better but still has mild residual headache; localized behind right eye but no vision loss; requesting extension of work note from last week; Also notes that she is ready to quit smoking; has been trying to make better food choices/ eat better/ lose weight since being home during quarantine; wants to continue with positive life changes;      Objective:  Vitals:   06/27/19 1002  BP: 140/78  Pulse: (!) 106  Temp: 98.7 F (37.1 C)  TempSrc: Oral  SpO2: 97%  Weight: (!) 344 lb (156 kg)  Height: 5\' 6"  (1.676 m)    General: Well developed, well nourished, in no acute distress  Skin : Warm and dry.  Head: Normocephalic and atraumatic  Eyes: Sclera and conjunctiva clear; pupils  round and reactive to light; extraocular movements intact  Ears: External normal; canals clear; tympanic membranes normal  Oropharynx: Pink, supple. No suspicious lesions  Neck: Supple without thyromegaly, adenopathy  Lungs: Respirations unlabored; clear to auscultation bilaterally without wheeze, rales, rhonchi  CVS exam: normal rate and regular rhythm.  Abdomen: Soft; nontender; nondistended; normoactive bowel sounds; no masses or hepatosplenomegaly  Neurologic: Alert and oriented; speech intact; face symmetrical; moves all extremities well; CNII-XII intact without focal deficit   Assessment:  1. Acute nonintractable headache, unspecified headache type   2.  Body mass index (BMI) 50.0-59.9, adult ([facility]CC) Chronic  3. Tobacco abuse     Plan:  1. Suspect migraine type headache secondary to recent sinus infection; Toradol IM 30 mg given in office; work note written as requested; follow-up worse, no better. 2. Encouraged to continue working on weight loss goals and healthier eating habits; 3. Congratulated patient on readiness to quit smoking; trial of Chantix; risks and benefits discussed; follow-up with response within 1 month.   No follow-ups on file.  No orders of the defined types were placed in this encounter.   Requested Prescriptions   Signed Prescriptions Disp Refills  . varenicline (CHANTIX STARTING MONTH PAK) 0.5 MG X 11 & 1 MG X 42 tablet 53 tablet 0    Sig: Take one 0.5 mg tablet by mouth once daily for 3 days, then increase to one 0.5 mg tablet twice daily for 4 days, then increase to one 1 mg tablet twice daily.

## 2019-07-02 ENCOUNTER — Other Ambulatory Visit: Payer: Self-pay | Admitting: Internal Medicine

## 2019-07-02 ENCOUNTER — Telehealth: Payer: Self-pay

## 2019-07-02 MED ORDER — PROMETHAZINE HCL 12.5 MG PO TABS: 12.5000 mg | ORAL_TABLET | Freq: Four times a day (QID) | ORAL | 0 refills | Status: DC | PRN

## 2019-07-02 NOTE — Telephone Encounter (Signed)
Try phenergan  RX sent

## 2019-07-02 NOTE — Telephone Encounter (Signed)
Copied from [place] [phone]. Topic: General - Other >> Jul 02, 2019  7:55 AM Carolyn Stare wrote: Pt said she took her first dose of Chantex and she think it may have cause her stomach to be upset, she is asking if there is anything to can take

## 2019-07-03 ENCOUNTER — Encounter: Payer: Self-pay | Admitting: Family

## 2019-07-03 NOTE — Telephone Encounter (Signed)
Spoke with patient and info given. She will call me back Thursday or Friday and let me know how she is doing. I told her Tiffany Conley would be back on Monday.

## 2019-07-06 ENCOUNTER — Other Ambulatory Visit: Payer: Self-pay

## 2019-07-06 MED ORDER — PROMETHAZINE HCL 12.5 MG PO TABS: 12.5000 mg | ORAL_TABLET | Freq: Four times a day (QID) | ORAL | 0 refills | Status: DC | PRN

## 2019-07-09 ENCOUNTER — Other Ambulatory Visit: Payer: Self-pay | Admitting: Family

## 2019-07-09 ENCOUNTER — Encounter: Payer: Self-pay | Admitting: Family

## 2019-07-23 ENCOUNTER — Encounter: Payer: Self-pay | Admitting: Family

## 2019-07-23 ENCOUNTER — Ambulatory Visit (INDEPENDENT_AMBULATORY_CARE_PROVIDER_SITE_OTHER): Payer: 59 | Admitting: Family

## 2019-07-23 DIAGNOSIS — J45909 Unspecified asthma, uncomplicated: Secondary | ICD-10-CM

## 2019-07-23 MED ORDER — DOXYCYCLINE HYCLATE 100 MG PO TABS: 100.0000 mg | ORAL_TABLET | Freq: Two times a day (BID) | ORAL | 0 refills | Status: DC

## 2019-07-23 MED ORDER — PREDNISONE 20 MG PO TABS: 40.0000 mg | ORAL_TABLET | Freq: Every day | ORAL | 0 refills | Status: DC

## 2019-07-23 NOTE — Progress Notes (Signed)
Tiffany Conley is a 38 y.o. female with the following history as recorded in EpicCare:  Patient Active Problem List   Diagnosis Date Noted  . Body mass index (BMI) 50.0-59.9, adult ([facility]) 06/27/2019  . Mild persistent asthma, poorly controlled 04/14/2018  . Cigarette smoker 04/14/2018  . Dyspnea on exertion   . Bronchitis 03/21/2018  . Acute lateral meniscus tear of left knee 08/23/2017  . Diarrhea 06/01/2017  . Nausea with vomiting 04/18/2017  . Low back pain 04/18/2017  . Occipital neuralgia of right side 04/27/2016  . Common migraine without intractability 04/27/2016  . Neck pain 04/27/2016  . 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 INTERNAL DERANGEMENT, RIGHT KNEE 09/08/2010  . Lateral meniscus tear 09/08/2010    Current Outpatient Medications  Medication Sig Dispense Refill  . albuterol (PROAIR HFA) 108 (90 Base) MCG/ACT inhaler 2 puffs every 4 hours as needed only  if your can't catch your breath 1 Inhaler 11  . budesonide-formoterol (SYMBICORT) 80-4.5 MCG/ACT inhaler Take 2 puffs first thing in am and then another 2 puffs about 12 hours later. 1 Inhaler 12  . levonorgestrel (MIRENA) 20 MCG/24HR IUD 1 each by Intrauterine route once. Placed June 2020    . doxycycline (VIBRA-TABS) 100 MG tablet Take 1 tablet (100 mg total) by mouth 2 (two) times daily. 20 tablet 0  . fluticasone (FLONASE) 50 MCG/ACT nasal spray Place 2 sprays into both nostrils daily. 16 g 6  . predniSONE (DELTASONE) 20 MG tablet Take 2 tablets (40 mg total) by mouth daily with breakfast. 10 tablet 0  . promethazine (PHENERGAN) 12.5 MG tablet Take 1 tablet (12.5 mg total) by mouth every 6 (six) hours as needed for nausea or vomiting. 30 tablet 0   No current facility-administered medications for this visit.     Allergies: Citrus, Ibuprofen, and Vicodin [hydrocodone-acetaminophen]  Past Medical History:  Diagnosis Date  . Headache   . Sciatic nerve pain   . UTI (lower urinary  tract infection)     Past Surgical History:  Procedure Laterality Date  . APPENDECTOMY    . CHOLECYSTECTOMY    . ENDOMETRIAL ABLATION    . KNEE ARTHROSCOPY    . SHOULDER SURGERY Left   . TONSILLECTOMY      Family History  Problem Relation Age of Onset  . Colon cancer Maternal Grandmother   . Healthy Mother   . Congestive Heart Failure Father     Social History   Tobacco Use  . Smoking status: Current Every Day Smoker    Packs/day: 0.50    Years: 10.00    Pack years: 5.00    Types: Cigarettes  . Smokeless tobacco: Never Used  Substance Use Topics  . Alcohol use: No    Subjective:    I connected with [name] on 07/23/19 at 10:40 AM EST by a video enabled telemedicine application and verified that I am speaking with the correct person using two identifiers. Provider is in office/ patient is at her home; patient and provider are the only 2 people on the video call.    I discussed the limitations of evaluation and management by telemedicine and the availability of in person appointments. The patient expressed understanding and agreed to proceed.  3 day history of cough/ congestion- feels like it has moved into her chest "sooner than normal." No concerns for COVID exposure- "I stay in the house all the time."   Objective:  There were no vitals filed for this visit.  General: Well developed, well nourished, in no acute distress  Head: Normocephalic and atraumatic  Lungs: Respirations unlabored;  Neurologic: Alert and oriented; speech intact; face symmetrical;   Assessment:  1. Acute asthmatic bronchitis     Plan:  Rx for Doxycycline, Prednisone; continue Symbicort and albuterol; increase fluids, rest; she is also encouraged to follow-up with her pulmonologist since she has not been seen there in over a year.   No follow-ups on file.  No orders of the defined types were placed in this encounter.   Requested Prescriptions   Signed Prescriptions Disp Refills   . doxycycline (VIBRA-TABS) 100 MG tablet 20 tablet 0    Sig: Take 1 tablet (100 mg total) by mouth 2 (two) times daily.  . predniSONE (DELTASONE) 20 MG tablet 10 tablet 0    Sig: Take 2 tablets (40 mg total) by mouth daily with breakfast.

## 2019-07-24 ENCOUNTER — Encounter: Payer: Self-pay | Admitting: Family

## 2019-07-24 ENCOUNTER — Other Ambulatory Visit: Payer: Self-pay | Admitting: Family

## 2019-08-20 ENCOUNTER — Ambulatory Visit (INDEPENDENT_AMBULATORY_CARE_PROVIDER_SITE_OTHER): Payer: 59 | Admitting: Internal Medicine

## 2019-08-20 DIAGNOSIS — Z720 Tobacco use: Secondary | ICD-10-CM | POA: Diagnosis not present

## 2019-08-20 DIAGNOSIS — Z7951 Long term (current) use of inhaled steroids: Secondary | ICD-10-CM

## 2019-08-20 DIAGNOSIS — J45909 Unspecified asthma, uncomplicated: Secondary | ICD-10-CM | POA: Diagnosis not present

## 2019-08-20 DIAGNOSIS — J4531 Mild persistent asthma with (acute) exacerbation: Secondary | ICD-10-CM

## 2019-08-20 MED ORDER — AMOXICILLIN-POT CLAVULANATE 875-125 MG PO TABS: 1.0000 | ORAL_TABLET | Freq: Two times a day (BID) | ORAL | 0 refills | Status: AC

## 2019-08-20 MED ORDER — FAMOTIDINE 20 MG PO TABS: ORAL_TABLET | ORAL | 11 refills | Status: DC

## 2019-08-20 MED ORDER — PANTOPRAZOLE SODIUM 40 MG PO TBEC: 40.0000 mg | DELAYED_RELEASE_TABLET | Freq: Every day | ORAL | 2 refills | Status: DC

## 2019-08-20 MED ORDER — BUDESONIDE-FORMOTEROL FUMARATE 80-4.5 MCG/ACT IN AERO: INHALATION_SPRAY | RESPIRATORY_TRACT | 12 refills | Status: DC

## 2019-08-20 NOTE — Progress Notes (Signed)
Tiffany Conley, female    DOB: 1981-02-01,     MRN: [id number]   Brief patient profile:  30 yobf active smoker with asthma as child bad enough to go ER and take allergy shots per Dr Orson Aloe age 38-12 and seemed to go away completely then pattern of bad bronchitis starting in 20's(p started smoking)  requiring pred/ abx/ cough meds but no maint rx then new pattern of chest tightness/ sob > [facility] 03/21/18 admit    Admit date: 03/21/2018 Discharge date: 03/22/2018  Brief/Interim Summary:38 y.o.femalewithno significantmedical history presented to the emergency department complaining of shortness of breath associated with cough and chest discomfort. Patientstated symptoms started suddenly 1 day prior to admission. Associated symptoms include suggestive fever and body aches. Also reports dyspnea on exertion, however denies orthopnea and lower extremity edema. Patient used her home inhaler with no relief. Patient denies any sick contacts or recent travel. Patient is active smoker.  ED Course:Upon ED evaluation patient was found to be tachycardic, dyspneic but not hypoxic, she was given albuterol and Solu-Medrol with some improvement. CT of the chest was performed due to concerns of PE however this was negative for blood clot.73mm pulmonary nodule noted on left lung apex. ED physician attempted to discharge patient however she was unable to ambulate without having severe dyspnea.    Discharge Diagnoses:  Active Problems:   Bronchitis 1] acute bronchitis improved with p.o. steroids and inhalers and nebulizing treatments.  She ambulated prior to discharge her saturation was above 98% on room air and after ambulation.  She was able to be discharged home.  I will discharge her on prednisone taper and inhalers she was taking at home.  Patient continues to smoke discussed about smoking cessation.  I have also given her a prescription for Diflucan for 3 days as patient complains of having  pressure every time she goes on steroids.  2] pulmonary nodules 3.5 mm nodules in the left lung apex follow-up CT scan in 12 months patient is a smoker.       04/13/2018  1st pulmoary  Smoker with bronchitis/ finished pred 04/07/18  Chief Complaint  Patient presents with   Pulmonary Consult    Referred by Dr. Ria Clock. Pt c/o SOB and CP since mid July 2019. She states she gets winded just talking.  She has a prod cough with min yellow sputum- worse in the early am and at night She is using albuterol 4-5 x per day.  symptoms started with typical cough over 24 h then admitted as above with neg Cta and much better at [facility] but doe = [facility]3 = can't walk 100 yards even at a slow pace at a flat grade s stopping due to sob  eg [employer] shopping vs baseline able to walk anywhere inside store ie flat /cool environement  Since d/c started on symbicort ? Strength and alb took last  around 4 h prior to OV   Cough: first thing in am yellowish small amt  Sleep: propped up 45 degree SABA use: alb 4-5 x per day  gen ant cp from coughing fits since admit  rec Plan A = Automatic = Symbicort 80 Take 2 puffs first thing in am and then another 2 puffs about 12 hours later.  Work on [employer] B = Backup Only use your albuterol (proventil)   Pantoprazole (protonix) 40 mg   Take  30-60 min before first meal of the day and Pepcid (famotidine)  20 mg one @  bedtime until return to office GERD diet  Please schedule a follow up office visit in 4 weeks,    Virtual Visit via Telephone Note 08/20/2019   I connected with Tiffany Conley on 08/20/19 at  8:45 AM EST by telephone and verified that I am speaking with the correct person using two identifiers.   I discussed the limitations, risks, security and privacy concerns of performing an evaluation and management service by telephone and the availability of in person appointments. I also discussed with the patient that there may be a patient  responsible charge related to this service. The patient expressed understanding and agreed to proceed.   History of Present Illness: Fine until late Oct  2020 on symb 80 2 pffs just hs then bad  head cold > into chest no better with doxy  Dyspnea:  More since onset of head cold  Cough: esp in am brown mucus  Sleeping: worse toward am  SABA use: twice daily now, was not using prior to flare  02: none    No obvious day to day or daytime variability or assoc   mucus plugs or hemoptysis or cp or chest tightness, subjective wheeze or overt   hb symptoms.    Also denies any obvious fluctuation of symptoms with weather or environmental changes or other aggravating or alleviating factors except as outlined above.   Meds reviewed/ med reconciliation completed       Observations/Objective: Nasal tone to voice,/ congested cough triggered by talking noted    Assessment and Plan: See problem list for active a/p's   Follow Up Instructions: See avs for instructions unique to this ov which includes revised/ updated med list     I discussed the assessment and treatment plan with the patient. The patient was provided an opportunity to ask questions and all were answered. The patient agreed with the plan and demonstrated an understanding of the instructions.   The patient was advised to call back or seek an in-person evaluation if the symptoms worsen or if the condition fails to improve as anticipated.  I provided 23 minutes of non-face-to-face time during this encounter.   Christinia Gully, MD

## 2019-08-20 NOTE — Patient Instructions (Addendum)
Plan A = Automatic = Symbicort 80 Take 2 puffs first thing in am and then another 2 puffs about 12 hours later.   Work on inhaler technique:  relax and gently blow all the way out then take a nice smooth deep breath back in, triggering the inhaler at same time you start breathing in.  Hold for up to 5 seconds if you can. Blow out thru nose. Rinse and gargle with water when done   Plan B = Backup Only use your albuterol (proventil)  as a rescue medication to be used if you can't catch your breath by resting or doing a relaxed purse lip breathing pattern.  - The less you use it, the better it will work when you need it. - Ok to use the inhaler up to 2 puffs  every 4 hours if you must but call for appointment if use goes up over your usual need - Don't leave home without it !!  (think of it like the spare tire for your car)    Pantoprazole (protonix) 40 mg   Take  30-60 min before first meal of the day and Pepcid (famotidine)  20 mg one after supper until return to office - this is the best way to tell whether stomach acid is contributing to your problem.     GERD (REFLUX)  is an extremely common cause of respiratory symptoms just like yours , many times with no obvious heartburn at all.    It can be treated with medication, but also with lifestyle changes including elevation of the head of your bed (ideally with 6 inch  bed blocks),  Smoking cessation, avoidance of late meals, excessive alcohol, and avoid fatty foods, chocolate, peppermint, colas, red wine, and acidic juices such as orange juice.  NO MINT OR MENTHOL PRODUCTS SO NO COUGH DROPS   USE SUGARLESS CANDY INSTEAD (Jolley ranchers or Stover's or Life Savers) or even ice chips will also do - the key is to swallow to prevent all throat clearing. NO OIL BASED VITAMINS - use powdered substitutes.  For cough   > delsym 2 tsp every 12 hours as needed   Excused from work today - we will put letter in your MyChart to cover this.   Please  schedule a follow up office visit in 2 weeks, sooner if needed  with all medications /inhalers/ solutions in hand so we can verify exactly what you are taking. This includes all medications from all doctors and over the counters

## 2019-08-22 ENCOUNTER — Encounter: Payer: Self-pay | Admitting: Internal Medicine

## 2019-08-22 NOTE — Assessment & Plan Note (Addendum)
Flare with rhintis/sinusitis since late Oct 2020   rx    max symbicort = 2bbid  augmentin x 10 days Prednisone 10 mg take  4 each am x 2 days,   2 each am x 2 days,  1 each am x 2 days and stop  F/u with ov in 2 weeks   Of the three most common causes of  Sub-acute / recurrent or chronic cough, only one (GERD)  can actually contribute to/ trigger  the other two (asthma and post nasal drip syndrome)  and perpetuate the cylce of cough.  While not intuitively obvious, many patients with chronic low grade reflux do not cough until there is a primary insult that disturbs the protective epithelial barrier and exposes sensitive nerve endings.   This is typically viral but can due to PNDS and  either may apply here.    >>>  The point is that once this occurs, it is difficult to eliminate the cycle  using anything but a maximally effective acid suppression regimen at least in the short run, accompanied by an appropriate diet to address non acid GERD and control / eliminate the cough itself with delsym 2 tsp every 12 hours and no nacotic meds until seen in office in 2 weeks with all meds in hand using a trust but verify approach to confirm accurate Medication  Reconciliation The principal here is that until we are certain that the  patients are doing what we've asked, it makes no sense to ask them to do more.    Each maintenance medication was reviewed in detail including most importantly the difference between maintenance and as needed and under what circumstances the prns are to be used.  Please see AVS for specific  Instructions which are unique to this visit and I personally typed out  which were reviewed in detail over the phone with the patient and a copy provided via [place].

## 2019-09-05 ENCOUNTER — Telehealth: Payer: Self-pay | Admitting: Internal Medicine

## 2019-09-05 ENCOUNTER — Other Ambulatory Visit: Payer: Self-pay

## 2019-09-05 ENCOUNTER — Ambulatory Visit (INDEPENDENT_AMBULATORY_CARE_PROVIDER_SITE_OTHER): Payer: 59 | Admitting: Internal Medicine

## 2019-09-05 ENCOUNTER — Ambulatory Visit: Payer: 59 | Admitting: Internal Medicine

## 2019-09-05 ENCOUNTER — Ambulatory Visit: Payer: 59

## 2019-09-05 ENCOUNTER — Encounter: Payer: Self-pay | Admitting: Internal Medicine

## 2019-09-05 DIAGNOSIS — J4531 Mild persistent asthma with (acute) exacerbation: Secondary | ICD-10-CM | POA: Diagnosis not present

## 2019-09-05 MED ORDER — AMOXICILLIN-POT CLAVULANATE 875-125 MG PO TABS: 1.0000 | ORAL_TABLET | Freq: Two times a day (BID) | ORAL | 0 refills | Status: AC

## 2019-09-05 MED ORDER — TRAMADOL HCL 50 MG PO TABS: 50.0000 mg | ORAL_TABLET | ORAL | 0 refills | Status: AC | PRN

## 2019-09-05 MED ORDER — METHYLPREDNISOLONE ACETATE 80 MG/ML IJ SUSP
120.0000 mg | Freq: Once | INTRAMUSCULAR | Status: AC
  Administered 2019-09-05: 120 mg via INTRAMUSCULAR

## 2019-09-05 NOTE — Telephone Encounter (Signed)
Pt was released today without going to get her cxr  Due to weight limit here will need to go to [place] office to complete this  LMTCB

## 2019-09-05 NOTE — Patient Instructions (Addendum)
Bed blocks 6-8 inches  Pantoprazole 40 mg Take 30-60 min before just the  first meal of the day   Augmentin 875 mg take one pill twice daily  X 10 days - take at breakfast and supper with large glass of water.  It would help reduce the usual side effects (diarrhea and yeast infections) if you ate cultured yogurt at lunch.    Please remember to go to the  x-ray department  for your tests - we will call you with the results when they are available    Take delsym two tsp every 12 hours and supplement if needed with  tramadol 50 mg up to 2 every 4 hours to suppress the urge to cough. Swallowing water and/or using ice chips/non mint and menthol containing candies (such as lifesavers or sugarless jolly ranchers) are also effective.  You should rest your voice and avoid activities that you know make you cough.  Once you have eliminated the cough for 3 straight days try reducing the tramadol first,  then the delsym as tolerated.    Depomedrol 120 mg IM    Please remember to go to the  x-ray department  for your tests - we will call you with the results when they are available    Please schedule a follow up office visit in 4 weeks, sooner if needed  with all medications /inhalers/ solutions in hand so we can verify exactly what you are taking. This includes all medications from all doctors and over the counters

## 2019-09-05 NOTE — Progress Notes (Signed)
Tiffany Conley, female    DOB: 1980-12-25,     MRN: [id number]   Brief patient profile:  39 yobf quit smoking 07/2019  with asthma as child bad enough to go ER and take allergy shots per Dr Koleen Nimrod age 39-12 and seemed to go away completely then pattern of bad bronchitis starting in 20's(p started smoking)  requiring pred/ abx/ cough meds but no maint rx then new pattern of chest tightness/ sob > [facility] 03/21/18 admit    Admit date: 03/21/2018 Discharge date: 03/22/2018  Brief/Interim Summary:38 y.o.femalewithno significantmedical history presented to the emergency department complaining of shortness of breath associated with cough and chest discomfort. Patientstated symptoms started suddenly 1 day prior to admission. Associated symptoms include suggestive fever and body aches. Also reports dyspnea on exertion, however denies orthopnea and lower extremity edema. Patient used her home inhaler with no relief. Patient denies any sick contacts or recent travel. Patient is active smoker.  ED Course:Upon ED evaluation patient was found to be tachycardic, dyspneic but not hypoxic, she was given albuterol and Solu-Medrol with some improvement. CT of the chest was performed due to concerns of PE however this was negative for blood clot.9mm pulmonary nodule noted on left lung apex. ED physician attempted to discharge patient however she was unable to ambulate without having severe dyspnea.    Discharge Diagnoses:  Active Problems:   Bronchitis 1] acute bronchitis improved with p.o. steroids and inhalers and nebulizing treatments.  She ambulated prior to discharge her saturation was above 98% on room air and after ambulation.  She was able to be discharged home.  I will discharge her on prednisone taper and inhalers she was taking at home.  Patient continues to smoke discussed about smoking cessation.  I have also given her a prescription for Diflucan for 3 days as patient complains of  having pressure every time she goes on steroids.  2] pulmonary nodules 3.5 mm nodules in the left lung apex follow-up CT scan in 12 months patient is a smoker.       04/13/2018  1st pulmoary  Smoker with bronchitis/ finished pred 04/07/18  Chief Complaint  Patient presents with  . Pulmonary Consult    Referred by Dr. Jodi Mourning. Pt c/o SOB and CP since mid July 2019. She states she gets winded just talking.  She has a prod cough with min yellow sputum- worse in the early am and at night She is using albuterol 4-5 x per day.  symptoms started with typical cough over 24 h then admitted as above with neg Cta and much better at [facility] but doe = MMRC3 = can't walk 100 yards even at a slow pace at a flat grade s stopping due to sob  eg [employer] shopping vs baseline able to walk anywhere inside store ie flat /cool environement  Since d/c started on symbicort ? Strength and alb took last  around 4 h prior to OV   Cough: first thing in am yellowish small amt  Sleep: propped up 45 degree SABA use: alb 4-5 x per day  Gen ant cp from coughing fits since admit  rec Plan A = Automatic = Symbicort 80 Take 2 puffs first thing in am and then another 2 puffs about 12 hours later.  Work on inhaler technique:  Plan B = Backup Only use your albuterol (proventil)  as a rescue medication Pantoprazole (protonix) 40 mg   Take  30-60 min before first meal of the day and Pepcid (famotidine)  20 mg one @  bedtime until return to office - this is the best way to tell whether stomach acid is contributing to your problem.   GERD diet. For cough daytime > delsym 2 tsp every 12 hours as needed  Please schedule a follow up office visit in 4 weeks, sooner if needed  with all medications /inhalers/ solutions in hand so we can verify exactly what you are taking. This includes all medications from all doctors and over the counters > did not return  televisit 08/20/2019 Fine until late Oct  2020 on symb 80 2 pffs just hs then bad   head cold > into chest no better with doxy  Dyspnea:  More since onset of head cold  Cough: esp in am brown mucus  Sleeping: worse toward am  SABA use: twice daily now, was not using prior to flare  02: none  rec Plan A = Automatic = Symbicort 80 Take 2 puffs first thing in am and then another 2 puffs about 12 hours later.  Work on Licensed conveyancerinhaler technique  Plan B = Backup Only use your albuterol (proventil)  as a rescue medication   Pantoprazole (protonix) 40 mg   Take  30-60 min before first meal of the day and Pepcid (famotidine)  20 mg one after supper until return to office  GERD   For cough   > delsym 2 tsp every 12 hours as needed  Excused from work today - we will put letter in your MyChart to cover this. Please schedule a follow up office visit in 2 weeks,     09/05/2019  f/u ov/Kiandre Spagnolo re: cough relapsed in Oct with head cold  Chief Complaint  Patient presents with  . Follow-up    Wheezing less since last televisit. Still having some cough and SOB. She is using her proair inhaler at least 3 x per day.   Dyspnea: fine if not coughing  Cough:   p stirs in am still brown mucus / assoc nasal congestion Sleeping: on side on flat bed 3 pillows  SABA use: uses 3-4 x per day 02: none  No longer on gerd rx    No obvious patterns in resp symptoms in times of day to day or daytime variability or assoc   mucus plugs or hemoptysis or cp or chest tightness,     Also denies any obvious fluctuation of symptoms with weather or environmental changes or other aggravating or alleviating factors except as outlined above   No unusual exposure hx or h/o childhood pna or knowledge of premature birth.  Current Allergies, Complete Past Medical History, Past Surgical History, Family History, and Social History were reviewed in [facility] electronic medical record.  ROS  The following are not active complaints unless bolded Hoarseness, sore throat, dysphagia, dental problems, itching, sneezing,   nasal congestion or discharge of excess mucus or purulent secretions, ear ache,   fever, chills, sweats, unintended wt loss or wt gain, classically pleuritic or exertional cp,  orthopnea pnd or arm/hand swelling  or leg swelling, presyncope, palpitations, abdominal pain, anorexia, nausea, vomiting, diarrhea  or change in bowel habits or change in bladder habits, change in stools or change in urine, dysuria, hematuria,  rash, arthralgias, visual complaints, headache, numbness, weakness or ataxia or problems with walking or coordination,  change in mood or  memory.        Current Meds  Medication Sig  . albuterol (PROAIR HFA) 108 (90 Base) MCG/ACT inhaler 2 puffs every  4 hours as needed only  if your can't catch your breath  . budesonide-formoterol (SYMBICORT) 80-4.5 MCG/ACT inhaler Take 2 puffs first thing in am and then another 2 puffs about 12 hours later.  . famotidine (PEPCID) 20 MG tablet One after supper  . fluticasone (FLONASE) 50 MCG/ACT nasal spray Place 2 sprays into both nostrils daily. (Patient taking differently: Place 2 sprays into both nostrils daily as needed. )  . levonorgestrel (MIRENA) 20 MCG/24HR IUD 1 each by Intrauterine route once. Placed June 2020         Past Medical History:  Diagnosis Date  . Headache   . Sciatic nerve pain   . UTI (lower urinary tract infection)           Objective:    amb obese bf with harsh honking dry cough  Wt Readings from Last 3 Encounters:  09/05/19 (!) 362 lb (164.2 kg)  06/27/19 (!) 344 lb (156 kg)  11/10/18 (!) 346 lb (156.9 kg)     Vital signs reviewed - Note on arrival 02 sats  99% on RA      HEENT : pt wearing mask not removed for exam due to covid -19 concerns.    NECK :  without JVD/Nodes/TM/ nl carotid upstrokes bilaterally   LUNGS: no acc muscle use,  Nl contour chest which is clear to A and P bilaterally without cough on insp or exp maneuvers   CV:  RRR  no s3 or murmur or increase in P2, and no edema   ABD:   Obese  nontender with nl inspiratory excursion in the supine position. No bruits or organomegaly appreciated, bowel sounds nl  MS:  Nl gait/ ext warm without deformities, calf tenderness, cyanosis or clubbing No obvious joint restrictions   SKIN: warm and dry without lesions    NEURO:  alert, approp, nl sensorium with  no motor or cerebellar deficits apparent.      cxr ordered 09/05/2019 but too heavy for equipment > referred to [facility] office       Assessment

## 2019-09-05 NOTE — Telephone Encounter (Signed)
Called and spoke with pt letting her know that MW wanted her to have a cxr performed and stated to her that she would need to go to [place] office to have it done. Pt verbalized understanding and stated if she was not able to go tomorrow 12/31 that she would go next Wednesday, 1/6 as that would be her next day off work. Nothing further needed.

## 2019-09-05 NOTE — Telephone Encounter (Signed)
Patient is returning phone call.  Patient phone number is [phone].

## 2019-09-06 ENCOUNTER — Encounter: Payer: Self-pay | Admitting: Internal Medicine

## 2019-09-06 NOTE — Assessment & Plan Note (Signed)
Body mass index is 58.43 kg/m.  -  trending up  No results found for: TSH   Contributing to gerd risk/ doe/reviewed the need and the process to achieve and maintain neg calorie balance > defer f/u primary care including intermittently monitoring thyroid status   I had an extended discussion with the patient reviewing all relevant studies completed to date and  lasting 15 to 20 minutes of a 25 minute visit    I performed detailed device teaching using a teach back method which extended face to face time for this visit (see above)  Each maintenance medication was reviewed in detail including emphasizing most importantly the difference between maintenance and prns and under what circumstances the prns are to be triggered using an action plan format that is not reflected in the computer generated alphabetically organized AVS which I have not found useful in most complex patients, especially with respiratory illnesses  Please see AVS for specific instructions unique to this visit that I personally wrote and verbalized to the the pt in detail and then reviewed with pt  by my nurse highlighting any  changes in therapy recommended at today's visit to their plan of care.

## 2019-09-06 NOTE — Assessment & Plan Note (Signed)
Flare with rhintis/sinusitis since late Oct 2020  - rx augmentin / max rx for GERD 09/05/2019  - 09/05/2019  After extensive coaching inhaler device,  effectiveness =    90%  DDX of  difficult airways management almost all start with A and  include Adherence, Ace Inhibitors, Acid Reflux, Active Sinus Disease, Alpha 1 Antitripsin deficiency, Anxiety masquerading as Airways dz,  ABPA,  Allergy(esp in young), Aspiration (esp in elderly), Adverse effects of meds,  Active smoking or vaping, A bunch of PE's (a small clot burden can't cause this syndrome unless there is already severe underlying pulm or vascular dz with poor reserve) plus two Bs  = Bronchiectasis and Beta blocker use..and one C= CHF  Adherence is always the initial "prime suspect" and is a multilayered concern that requires a "trust but verify" approach in every patient - starting with knowing how to use medications, especially inhalers, correctly, keeping up with refills and understanding the fundamental difference between maintenance and prns vs those medications only taken for a very short course and then stopped and not refilled.  - advised to keep appts and return with all meds in hand using a trust but verify approach to confirm accurate Medication  Reconciliation The principal here is that until we are certain that the  patients are doing what we've asked, it makes no sense to ask them to do more.  - see hfa teaching   ? Acid (or non-acid) GERD > always difficult to exclude as up to 75% of pts in some series report no assoc GI/ Heartburn symptoms> rec max (24h)  acid suppression and diet restrictions/ reviewed and instructions given in writing.   ? Active sinus dz > augmentin x 10 days then consider sinus ct if not improving   ? Active smoking > denies x one month, reinforced impt  ? Allergy > depomedrol 120 mg IM/ continue symbicort 80 2bid   >>> f/u in 4 weeks

## 2019-09-10 ENCOUNTER — Encounter: Payer: Self-pay | Admitting: General Surgery

## 2019-09-10 ENCOUNTER — Encounter: Payer: Self-pay | Admitting: Family

## 2019-09-10 ENCOUNTER — Other Ambulatory Visit: Payer: Self-pay | Admitting: Family

## 2019-09-10 ENCOUNTER — Ambulatory Visit (INDEPENDENT_AMBULATORY_CARE_PROVIDER_SITE_OTHER): Payer: 59 | Admitting: Family

## 2019-09-10 DIAGNOSIS — J028 Acute pharyngitis due to other specified organisms: Secondary | ICD-10-CM | POA: Diagnosis not present

## 2019-09-10 DIAGNOSIS — B9789 Other viral agents as the cause of diseases classified elsewhere: Secondary | ICD-10-CM

## 2019-09-10 NOTE — Telephone Encounter (Signed)
Sorry to hear that, let Dr Dayton Scrape comment on further work restrictions/ approval to return  since he's doing the work up for COVID 19

## 2019-09-10 NOTE — Telephone Encounter (Signed)
Yes in fact up until she made contact with Dr Dayton Scrape is covered by my me and then anything going forward  would be Dr Rennie Plowman to decide/document/justify etc

## 2019-09-10 NOTE — Progress Notes (Signed)
Tiffany Conley is a 39 y.o. female with the following history as recorded in EpicCare:  Patient Active Problem List   Diagnosis Date Noted  . Morbid (severe) obesity due to excess calories ([place]) 09/06/2019  . Body mass index (BMI) 50.0-59.9, adult ([place]) 06/27/2019  . Mild persistent asthma, poorly controlled 04/14/2018  . Cigarette smoker 04/14/2018  . Dyspnea on exertion   . Asthmatic bronchitis with acute exacerbation 03/21/2018  . Acute lateral meniscus tear of left knee 08/23/2017  . Diarrhea 06/01/2017  . Nausea with vomiting 04/18/2017  . Low back pain 04/18/2017  . Occipital neuralgia of right side 04/27/2016  . Common migraine without intractability 04/27/2016  . Neck pain 04/27/2016  . 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 INTERNAL DERANGEMENT, RIGHT KNEE 09/08/2010  . Lateral meniscus tear 09/08/2010    Current Outpatient Medications  Medication Sig Dispense Refill  . albuterol (PROAIR HFA) 108 (90 Base) MCG/ACT inhaler 2 puffs every 4 hours as needed only  if your can't catch your breath 1 Inhaler 11  . amoxicillin-clavulanate (AUGMENTIN) 875-125 MG tablet Take 1 tablet by mouth 2 (two) times daily for 10 days. 20 tablet 0  . budesonide-formoterol (SYMBICORT) 80-4.5 MCG/ACT inhaler Take 2 puffs first thing in am and then another 2 puffs about 12 hours later. 1 Inhaler 12  . famotidine (PEPCID) 20 MG tablet One after supper 30 tablet 11  . fluticasone (FLONASE) 50 MCG/ACT nasal spray Place 2 sprays into both nostrils daily. (Patient taking differently: Place 2 sprays into both nostrils daily as needed. ) 16 g 6  . levonorgestrel (MIRENA) 20 MCG/24HR IUD 1 each by Intrauterine route once. Placed June 2020    . pantoprazole (PROTONIX) 40 MG tablet Take 1 tablet (40 mg total) by mouth daily. Take 30-60 min before first meal of the day (Patient not taking: Reported on 09/05/2019) 30 tablet 2  . traMADol (ULTRAM) 50 MG tablet Take 1 tablet (50 mg  total) by mouth every 4 (four) hours as needed for up to 5 days (for cough or pain). 40 tablet 0   No current facility-administered medications for this visit.    Allergies: Citrus, Ibuprofen, and Vicodin [hydrocodone-acetaminophen]  Past Medical History:  Diagnosis Date  . Headache   . Sciatic nerve pain   . UTI (lower urinary tract infection)     Past Surgical History:  Procedure Laterality Date  . APPENDECTOMY    . CHOLECYSTECTOMY    . ENDOMETRIAL ABLATION    . KNEE ARTHROSCOPY    . SHOULDER SURGERY Left   . TONSILLECTOMY      Family History  Problem Relation Age of Onset  . Colon cancer Maternal Grandmother   . Healthy Mother   . Congestive Heart Failure Father     Social History   Tobacco Use  . Smoking status: Former Smoker    Packs/day: 1.00    Years: 10.00    Pack years: 10.00    Types: Cigarettes    Quit date: 07/08/2019    Years since quitting: 0.1  . Smokeless tobacco: Never Used  Substance Use Topics  . Alcohol use: No    Subjective:   I connected with Tiffany Conley on 09/10/19 at  by a video enabled telemedicine application and verified that I am speaking with the correct person using two identifiers.   I discussed the limitations of evaluation and management by telemedicine and the availability of in person appointments. The patient expressed understanding and agreed to proceed.  Provider in office/ patient is at home; provider and patient are only 2 people on video call.   Started on abx by her pulmonologist last week for persisting bronchitis/ sinus issues; notes that her cough is better but has since developed secondary sore throat, fever, diarrhea; sore throat started last Friday- fever began yesterday; feeling weak and sluggish;    Objective:  There were no vitals filed for this visit.  General: Well developed, well nourished, in no acute distress  Skin : Warm and dry.  Head: Normocephalic and atraumatic  Lungs: Respirations unlabored;   Neurologic: Alert and oriented; speech intact; face symmetrical;   Assessment:  1. Sore throat (viral)     Plan:  Based on presentation, am concerned for COVID; she is encouraged to schedule for COVID test and quarantine until results available; work note given for today and tomorrow- she is already scheduled to be off work on Wednesday and Thursday; she will complete the Augmentin prescribed by her pulmonologist however.    No follow-ups on file.  No orders of the defined types were placed in this encounter.   Requested Prescriptions    No prescriptions requested or ordered in this encounter

## 2019-09-10 NOTE — Telephone Encounter (Signed)
Find to write letter but needs ov with all meds in hand 09/13/19 if not 100% better

## 2019-09-10 NOTE — Progress Notes (Signed)
Tiffany Conley is a 39 y.o. female with the following history as recorded in EpicCare:  Patient Active Problem List   Diagnosis Date Noted  . Morbid (severe) obesity due to excess calories ([facility]) 09/06/2019  . Body mass index (BMI) 50.0-59.9, adult ([facility]) 06/27/2019  . Mild persistent asthma, poorly controlled 04/14/2018  . Cigarette smoker 04/14/2018  . Dyspnea on exertion   . Asthmatic bronchitis with acute exacerbation 03/21/2018  . Acute lateral meniscus tear of left knee 08/23/2017  . Diarrhea 06/01/2017  . Nausea with vomiting 04/18/2017  . Low back pain 04/18/2017  . Occipital neuralgia of right side 04/27/2016  . Common migraine without intractability 04/27/2016  . Neck pain 04/27/2016  . 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 INTERNAL DERANGEMENT, RIGHT KNEE 09/08/2010  . Lateral meniscus tear 09/08/2010    Current Outpatient Medications  Medication Sig Dispense Refill  . albuterol (PROAIR HFA) 108 (90 Base) MCG/ACT inhaler 2 puffs every 4 hours as needed only  if your can't catch your breath 1 Inhaler 11  . amoxicillin-clavulanate (AUGMENTIN) 875-125 MG tablet Take 1 tablet by mouth 2 (two) times daily for 10 days. 20 tablet 0  . budesonide-formoterol (SYMBICORT) 80-4.5 MCG/ACT inhaler Take 2 puffs first thing in am and then another 2 puffs about 12 hours later. 1 Inhaler 12  . famotidine (PEPCID) 20 MG tablet One after supper 30 tablet 11  . fluticasone (FLONASE) 50 MCG/ACT nasal spray Place 2 sprays into both nostrils daily. (Patient taking differently: Place 2 sprays into both nostrils daily as needed. ) 16 g 6  . levonorgestrel (MIRENA) 20 MCG/24HR IUD 1 each by Intrauterine route once. Placed June 2020    . pantoprazole (PROTONIX) 40 MG tablet Take 1 tablet (40 mg total) by mouth daily. Take 30-60 min before first meal of the day (Patient not taking: Reported on 09/05/2019) 30 tablet 2  . traMADol (ULTRAM) 50 MG tablet Take 1 tablet (50 mg  total) by mouth every 4 (four) hours as needed for up to 5 days (for cough or pain). 40 tablet 0   No current facility-administered medications for this visit.    Allergies: Citrus, Ibuprofen, and Vicodin [hydrocodone-acetaminophen]  Past Medical History:  Diagnosis Date  . Headache   . Sciatic nerve pain   . UTI (lower urinary tract infection)     Past Surgical History:  Procedure Laterality Date  . APPENDECTOMY    . CHOLECYSTECTOMY    . ENDOMETRIAL ABLATION    . KNEE ARTHROSCOPY    . SHOULDER SURGERY Left   . TONSILLECTOMY      Family History  Problem Relation Age of Onset  . Colon cancer Maternal Grandmother   . Healthy Mother   . Congestive Heart Failure Father     Social History   Tobacco Use  . Smoking status: Former Smoker    Packs/day: 1.00    Years: 10.00    Pack years: 10.00    Types: Cigarettes    Quit date: 07/08/2019    Years since quitting: 0.1  . Smokeless tobacco: Never Used  Substance Use Topics  . Alcohol use: No     Subjective:   I connected with [name] on 09/10/19 at  9:40 AM EST by a video enabled telemedicine application and verified that I am speaking with the correct person using two identifiers. Provider in office/ patient is at home; provider and patient are only 2 people on video call.    I discussed the  limitations of evaluation and management by telemedicine and the availability of in person appointments. The patient expressed understanding and agreed to proceed.  Started with sore throat last Friday; + fever; +diarrhea/ fatigue; is on Augmentin per her pulmonologist and feels that cough is better; is concerned for secondary COVID infection; also needs work note for today and tomorrow;    Objective:  There were no vitals filed for this visit.  General: Well developed, well nourished, in no acute distress  Lungs: Respirations unlabored;  Neurologic: Alert and oriented; speech intact; face symmetrical; moves all extremities  well; CNII-XII intact without focal deficit   Assessment:  1. Sore throat (viral)     Plan:  Agree COVID testing is appropriate; complete antibiotic as prescribed by her pulmonologist; work note for today and tomorrow; increase fluids, rest and symptomatic treatment discussed.   No follow-ups on file.  No orders of the defined types were placed in this encounter.   Requested Prescriptions    No prescriptions requested or ordered in this encounter

## 2019-09-11 ENCOUNTER — Ambulatory Visit: Payer: 59 | Attending: Internal Medicine

## 2019-09-11 ENCOUNTER — Other Ambulatory Visit: Payer: 59

## 2019-09-11 DIAGNOSIS — Z20822 Contact with and (suspected) exposure to covid-19: Secondary | ICD-10-CM

## 2019-09-13 LAB — NOVEL CORONAVIRUS, NAA: SARS-CoV-2, NAA: NOT DETECTED

## 2019-09-14 ENCOUNTER — Ambulatory Visit (INDEPENDENT_AMBULATORY_CARE_PROVIDER_SITE_OTHER): Payer: 59 | Admitting: Family

## 2019-09-14 ENCOUNTER — Ambulatory Visit: Payer: Self-pay | Admitting: *Deleted

## 2019-09-14 DIAGNOSIS — B9789 Other viral agents as the cause of diseases classified elsewhere: Secondary | ICD-10-CM | POA: Diagnosis not present

## 2019-09-14 DIAGNOSIS — J028 Acute pharyngitis due to other specified organisms: Secondary | ICD-10-CM | POA: Diagnosis not present

## 2019-09-14 MED ORDER — MAGIC MOUTHWASH: 5.0000 mL | Freq: Four times a day (QID) | ORAL | 0 refills | Status: DC | PRN

## 2019-09-14 NOTE — Progress Notes (Signed)
Tiffany Conley is a 39 y.o. female with the following history as recorded in [employer]:  Patient Active Problem List   Diagnosis Date Noted  . Morbid (severe) obesity due to excess calories (HCC) 09/06/2019  . Body mass index (BMI) 50.0-59.9, adult (HCC) 06/27/2019  . Mild persistent asthma, poorly controlled 04/14/2018  . Cigarette smoker 04/14/2018  . Dyspnea on exertion   . Asthmatic bronchitis with acute exacerbation 03/21/2018  . Acute lateral meniscus tear of left knee 08/23/2017  . Diarrhea 06/01/2017  . Nausea with vomiting 04/18/2017  . Low back pain 04/18/2017  . Occipital neuralgia of right side 04/27/2016  . Common migraine without intractability 04/27/2016  . Neck pain 04/27/2016  . 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 INTERNAL DERANGEMENT, RIGHT KNEE 09/08/2010  . Lateral meniscus tear 09/08/2010    Current Outpatient Medications  Medication Sig Dispense Refill  . albuterol (PROAIR HFA) 108 (90 Base) MCG/ACT inhaler 2 puffs every 4 hours as needed only  if your can't catch your breath 1 Inhaler 11  . amoxicillin-clavulanate (AUGMENTIN) 875-125 MG tablet Take 1 tablet by mouth 2 (two) times daily for 10 days. 20 tablet 0  . budesonide-formoterol (SYMBICORT) 80-4.5 MCG/ACT inhaler Take 2 puffs first thing in am and then another 2 puffs about 12 hours later. 1 Inhaler 12  . famotidine (PEPCID) 20 MG tablet One after supper 30 tablet 11  . fluticasone (FLONASE) 50 MCG/ACT nasal spray Place 2 sprays into both nostrils daily. (Patient taking differently: Place 2 sprays into both nostrils daily as needed. ) 16 g 6  . levonorgestrel (MIRENA) 20 MCG/24HR IUD 1 each by Intrauterine route once. Placed June 2020    . magic mouthwash SOLN Take 5 mLs by mouth 4 (four) times daily as needed for mouth pain. 120 mL 0  . pantoprazole (PROTONIX) 40 MG tablet Take 1 tablet (40 mg total) by mouth daily. Take 30-60 min before first meal of the day (Patient not  taking: Reported on 09/05/2019) 30 tablet 2   No current facility-administered medications for this visit.    Allergies: Citrus, Ibuprofen, and Vicodin [hydrocodone-acetaminophen]  Past Medical History:  Diagnosis Date  . Headache   . Sciatic nerve pain   . UTI (lower urinary tract infection)     Past Surgical History:  Procedure Laterality Date  . APPENDECTOMY    . CHOLECYSTECTOMY    . ENDOMETRIAL ABLATION    . KNEE ARTHROSCOPY    . SHOULDER SURGERY Left   . TONSILLECTOMY      Family History  Problem Relation Age of Onset  . Colon cancer Maternal Grandmother   . Healthy Mother   . Congestive Heart Failure Father     Social History   Tobacco Use  . Smoking status: Former Smoker    Packs/day: 1.00    Years: 10.00    Pack years: 10.00    Types: Cigarettes    Quit date: 07/08/2019    Years since quitting: 0.1  . Smokeless tobacco: Never Used  Substance Use Topics  . Alcohol use: No    Subjective:    I connected with Tiffany Conley on 09/14/19 at  1:20 PM EST by a video enabled telemedicine application and verified that I am speaking with the correct person using two identifiers. Provider in office/ patient is at home; provider and patient are only 2 people on video call.     I discussed the limitations of evaluation and management by telemedicine and the availability  of in person appointments. The patient expressed understanding and agreed to proceed.  Complaining of "white spot" on left side of throat; notes that it is painful to swallow but no difficulty breathing or swallowing; currently on antibiotics and takes Symbicort daily; no fever; has had negative COVID test this week;      Objective:  There were no vitals filed for this visit.  General: Well developed, well nourished, in no acute distress  Lungs: Respirations unlabored;  Neurologic: Alert and oriented; speech intact;    1. Sore throat (viral)     Plan:  Unable to visualize sore throat on  virtual visit; will treat for possible thrush or aphthous ulcer; Rx for Magic Mouthwash sent to patient's pharmacy; follow-up worse, no better.   No follow-ups on file.  No orders of the defined types were placed in this encounter.   Requested Prescriptions   Signed Prescriptions Disp Refills  . magic mouthwash SOLN 120 mL 0    Sig: Take 5 mLs by mouth 4 (four) times daily as needed for mouth pain.

## 2019-09-14 NOTE — Telephone Encounter (Signed)
  This pt was transferred to the [facility] thinking she was calling Dr. Denton Meek office.   I did a brief triage for her c/o a sore throat on the left side.   She saw Dr. Dayton Scrape because she has not been feeling good.   She was given a steroid shot at that visit. I apologized for her being routed to our department and suggested she call Dr. Rennie Plowman office.    I asked if she had the number and she does.   She's going to call Dr. Denton Meek office since she is her PCP and has been seen already for this illness.  Pt was agreeable to calling Dr. Rennie Plowman office. Reason for Disposition . [1] Pus on tonsils (back of throat) AND [2]  fever AND [3] swollen neck lymph nodes ("glands")  Answer Assessment - Initial Assessment Questions 1. ONSET: "When did the throat start hurting?" (Hours or days ago)      It was last Friday I got a sore throat.    I saw Dr. Sherene Sires and he gave me a steroid shot.    Friday my mouth is sore.   Sunday fever 101.2    Monday I had chills.   I spoke with Dr. Dayton Scrape on Tuesday.   She suggested I get tested for COVID-19.   It was negative. Fever is 98.8 this morning.   I'm still coughing.    I'm feeling very tired.  2. SEVERITY: "How bad is the sore throat?" (Scale 1-10; mild, moderate or severe)   - MILD (1-3):  doesn't interfere with eating or normal activities   - MODERATE (4-7): interferes with eating some solids and normal activities   - SEVERE (8-10):  excruciating pain, interferes with most normal activities   - SEVERE DYSPHAGIA: can't swallow liquids, drooling     No problem with swallowing except it hurts.   It's only on the left side. 3. STREP EXPOSURE: "Has there been any exposure to strep within the past week?" If so, ask: "What type of contact occurred?"      No 4.  VIRAL SYMPTOMS: "Are there any symptoms of a cold, such as a runny nose, cough, hoarse voice or red eyes?"      Cough, tired, a couple of dizzy spells. 5. FEVER: "Do you have a  fever?" If so, ask: "What is your temperature, how was it measured, and when did it start?"     Yes on and off 6. PUS ON THE TONSILS: "Is there pus on the tonsils in the back of your throat?"     I don't have tonsils.    It looks like a white bump on the left side of my throat.    7. OTHER SYMPTOMS: "Do you have any other symptoms?" (e.g., difficulty breathing, headache, rash)     No 8. PREGNANCY: "Is there any chance you are pregnant?" "When was your last menstrual period?"     No  Protocols used: SORE THROAT-A-AH

## 2019-09-17 ENCOUNTER — Encounter: Payer: Self-pay | Admitting: Family

## 2019-09-19 ENCOUNTER — Telehealth (INDEPENDENT_AMBULATORY_CARE_PROVIDER_SITE_OTHER): Payer: 59 | Admitting: Primary Care

## 2019-09-19 DIAGNOSIS — J449 Chronic obstructive pulmonary disease, unspecified: Secondary | ICD-10-CM | POA: Diagnosis not present

## 2019-09-19 DIAGNOSIS — B370 Candidal stomatitis: Secondary | ICD-10-CM | POA: Diagnosis not present

## 2019-09-19 MED ORDER — FLUCONAZOLE 100 MG PO TABS: 100.0000 mg | ORAL_TABLET | Freq: Every day | ORAL | 0 refills | Status: DC

## 2019-09-19 MED ORDER — PROMETHAZINE-CODEINE 6.25-10 MG/5ML PO SYRP: 5.0000 mL | ORAL_SOLUTION | Freq: Four times a day (QID) | ORAL | 0 refills | Status: DC | PRN

## 2019-09-19 NOTE — Progress Notes (Signed)
Virtual Visit via Video Note  I connected with Tiffany Conley on 09/19/19 at  3:00 PM EST by a video enabled telemedicine application and verified that I am speaking with the correct person using two identifiers.  Location: Patient: Home Provider: Office   I discussed the limitations of evaluation and management by telemedicine and the availability of in person appointments. The patient expressed understanding and agreed to proceed.  Brief patient profile:  38 yobf quit smoking 07/2019  with asthma as child bad enough to go ER and take allergy shots per Dr Orson Aloe age 42-12 and seemed to go away completely then pattern of bad bronchitis starting in 20's(p started smoking)  requiring pred/ abx/ cough meds but no maint rx then new pattern of chest tightness/ sob > [facility] 03/21/18 admit  History of Present Illness: 39 year old female, former smoker. PMH significant for mild persistent asthma, rhinitis. Patient of Dr. Sherene Sires. Treated for acute sinusitis with Doxycycling in October of 2020 with no reported improvement. She was seen on 09/05/19 and reported continued cough with mucus production and nasal congestin. She was given course of Augmentin. Maintained on Symbicort 80 two puffs twice daily; albuterol hfa. Recommend max GERD treatment.   09/19/2019  Patient contacted today for video visit. She was last seen on 09/05/19 for acute bronchitis symptoms. She was given Augmentin course and tramadol for cough. She felt better initially after completing antibiotic but reports that her cough returned shortly after. Cough is mainly dry with occasional production. She tested negative for COVID on 09/11/18. Continues to take Symbicort 80 twice daily. She is compliant with protonix 40mg  daily and pepcid 20mg  daily. Developed thrush symptoms after steroid injection, she was prescribed magic mouth wash from her primary care provider which she has been taking for the last 4-5 days with no improvement.     Observations/Objective:  - Appears well, able to speak in full sentences  Assessment and Plan:  Asthmatic bronchitis: - Covid testing negative on 09/11/18  - CXR 09/19/2019 showed mild central airway thickening consistent with bronchitis or reactive airway disease. No evidence of pneumonia.  - Continue to take Symbicort 80 twice daily; prn albuterol hfa as back up  - RX PWC q6 hours for cough (DO not to take with tramadol) - Avoid mint/menthol productive; recommend voice rest and avoid clearing throat  - Max GERD therapy  Sinusitis: - Some improvement after completing Augmentin, lingering cough - Consider CT sinuses if no improvement   Thrush: - No improvement with magic mouth wash - RX Diflucan 100mg  qd x 1 week   Follow Up Instructions:  - FU in 2-4 weeks and as needed if symptoms do not improve or worsen  I discussed the assessment and treatment plan with the patient. The patient was provided an opportunity to ask questions and all were answered. The patient agreed with the plan and demonstrated an understanding of the instructions.   The patient was advised to call back or seek an in-person evaluation if the symptoms worsen or if the condition fails to improve as anticipated.  I provided 18 minutes of non-face-to-face time during this encounter.   11/10/18, NP

## 2019-09-21 ENCOUNTER — Encounter: Payer: Self-pay | Admitting: General Surgery

## 2019-09-21 NOTE — Telephone Encounter (Signed)
Yes that is fine

## 2019-09-24 ENCOUNTER — Encounter: Payer: Self-pay | Admitting: Primary Care

## 2019-09-24 ENCOUNTER — Other Ambulatory Visit: Payer: Self-pay

## 2019-09-24 ENCOUNTER — Ambulatory Visit (INDEPENDENT_AMBULATORY_CARE_PROVIDER_SITE_OTHER)
Admission: RE | Admit: 2019-09-24 | Discharge: 2019-09-24 | Disposition: A | Discharge status: Home / Self Care | Payer: 59 | Source: Ambulatory Visit | Attending: Internal Medicine | Admitting: Internal Medicine

## 2019-09-24 DIAGNOSIS — J4531 Mild persistent asthma with (acute) exacerbation: Secondary | ICD-10-CM

## 2019-09-24 NOTE — Progress Notes (Signed)
Please let patient know CXR was consistent with bronchitis. No evidence of PNA. Continue previous recommendations. If no improvement in another week or two needs follow-up.

## 2019-09-24 NOTE — Patient Instructions (Signed)
Bronchitis: - Covid testing negative on 09/11/18  - CXR 09/19/2019 showed mild central airway thickening consistent with bronchitis. No evidence of pneumonia.  - RX promethazine with codeine cough syrup every 6 hours for cough (not to take with tramadol)  Thrush: - RX Diflucan 100mg  qd x 1 week    Acute Bronchitis, Adult  Acute bronchitis is when air tubes in the lungs (bronchi) suddenly get swollen. The condition can make it hard for you to breathe. In adults, acute bronchitis usually goes away within 2 weeks. A cough caused by bronchitis may last up to 3 weeks. Smoking, allergies, and asthma can make the condition worse. What are the causes? This condition is caused by:  Cold and flu viruses. The most common cause of this condition is the virus that causes the common cold.  Bacteria.  Substances that irritate the lungs, including: ? Smoke from cigarettes and other types of tobacco. ? Dust and pollen. ? Fumes from chemicals, gases, or burned fuel. ? Other materials that pollute indoor or outdoor air.  Close contact with someone who has acute bronchitis. What increases the risk? The following factors may make you more likely to develop this condition:  A weak body's defense system. This is also called the immune system.  Any condition that affects your lungs and breathing, such as asthma. What are the signs or symptoms? Symptoms of this condition include:  A cough.  Coughing up clear, yellow, or green mucus.  Wheezing.  Chest congestion.  Shortness of breath.  A fever.  Body aches.  Chills.  A sore throat. How is this treated? Acute bronchitis may go away over time without treatment. Your doctor may recommend:  Drinking more fluids.  Taking a medicine for a fever or cough.  Using a device that gets medicine into your lungs (inhaler).  Using a vaporizer or a humidifier. These are machines that add water or moisture in the air to help with coughing and poor  breathing. Follow these instructions at home:  Activity  Get a lot of rest.  Avoid places where there are fumes from chemicals.  Return to your normal activities as told by your doctor. Ask your doctor what activities are safe for you. Lifestyle  Drink enough fluids to keep your pee (urine) pale yellow.  Do not drink alcohol.  Do not use any products that contain nicotine or tobacco, such as cigarettes, e-cigarettes, and chewing tobacco. If you need help quitting, ask your doctor. Be aware that: ? Your bronchitis will get worse if you smoke or breathe in other people's smoke (secondhand smoke). ? Your lungs will heal faster if you quit smoking. General instructions  Take over-the-counter and prescription medicines only as told by your doctor.  Use an inhaler, cool mist vaporizer, or humidifier as told by your doctor.  Rinse your mouth often with salt water. To make salt water, dissolve -1 tsp (3-6 g) of salt in 1 cup (237 mL) of warm water.  Keep all follow-up visits as told by your doctor. This is important. How is this prevented? To lower your risk of getting this condition again:  Wash your hands often with soap and water. If soap and water are not available, use hand sanitizer.  Avoid contact with people who have cold symptoms.  Try not to touch your mouth, nose, or eyes with your hands.  Make sure to get the flu shot every year. Contact a doctor if:  Your symptoms do not get better in 2 weeks.  You vomit more than once or twice.  You have symptoms of loss of fluid from your body (dehydration). These include: ? Dark urine. ? Dry skin or eyes. ? Increased thirst. ? Headaches. ? Confusion. ? Muscle cramps. Get help right away if:  You cough up blood.  You have chest pain.  You have very bad shortness of breath.  You become dehydrated.  You faint or keep feeling like you are going to faint.  You keep vomiting.  You have a very bad headache.  Your  fever or chills get worse. These symptoms may be an emergency. Do not wait to see if the symptoms will go away. Get medical help right away. Call your local emergency services (911 in the [place] Do not drive yourself to the hospital. Summary  Acute bronchitis is when air tubes in the lungs (bronchi) suddenly get swollen. In adults, acute bronchitis usually goes away within 2 weeks.  Take over-the-counter and prescription medicines only as told by your doctor.  Drink enough fluid to keep your pee (urine) pale yellow.  Contact a doctor if your symptoms do not improve after 2 weeks of treatment.  Get help right away if you cough up blood, faint, or have chest pain or shortness of breath. This information is not intended to replace advice given to you by your health care provider. Make sure you discuss any questions you have with your health care provider. Document Revised: 03/16/2019 Document Reviewed: 03/16/2019 Elsevier Patient Education  2020 [employer].

## 2019-09-26 NOTE — Telephone Encounter (Signed)
Called [employer] and spoke with Scheryl Marten to inquire if there is an e-mail address that can be provided to patient:  [email or web address]  Information sent to patient via mychart

## 2019-10-02 ENCOUNTER — Telehealth: Payer: Self-pay | Admitting: Internal Medicine

## 2019-10-08 NOTE — Telephone Encounter (Signed)
done

## 2019-10-08 NOTE — Telephone Encounter (Signed)
Done and returned to [employer]

## 2019-10-08 NOTE — Telephone Encounter (Signed)
Forms in Dr Thurston Hole lookat to be signed, thanks

## 2019-10-08 NOTE — Telephone Encounter (Signed)
Patient states needs forms by 10/09/2019.  Patient phone number is [phone].

## 2019-10-09 NOTE — Telephone Encounter (Signed)
Sent over to [employer] yesterday

## 2019-10-10 ENCOUNTER — Telehealth: Payer: Self-pay | Admitting: *Deleted

## 2019-10-10 ENCOUNTER — Ambulatory Visit (INDEPENDENT_AMBULATORY_CARE_PROVIDER_SITE_OTHER): Payer: 59 | Admitting: Internal Medicine

## 2019-10-10 ENCOUNTER — Encounter: Payer: Self-pay | Admitting: Internal Medicine

## 2019-10-10 ENCOUNTER — Other Ambulatory Visit: Payer: Self-pay

## 2019-10-10 DIAGNOSIS — J4531 Mild persistent asthma with (acute) exacerbation: Secondary | ICD-10-CM | POA: Diagnosis not present

## 2019-10-10 DIAGNOSIS — R05 Cough: Secondary | ICD-10-CM | POA: Diagnosis not present

## 2019-10-10 DIAGNOSIS — R058 Other specified cough: Secondary | ICD-10-CM | POA: Insufficient documentation

## 2019-10-10 MED ORDER — BENZONATATE 200 MG PO CAPS: 200.0000 mg | ORAL_CAPSULE | Freq: Three times a day (TID) | ORAL | 2 refills | Status: DC | PRN

## 2019-10-10 NOTE — Progress Notes (Signed)
Tiffany Conley, female    DOB: 1980-12-25,     MRN: [id number]   Brief patient profile:  39 yobf quit smoking 07/2019  with asthma as child bad enough to go ER and take allergy shots per Dr Koleen Nimrod age 40-12 and seemed to go away completely then pattern of bad bronchitis starting in 20's(p started smoking)  requiring pred/ abx/ cough meds but no maint rx then new pattern of chest tightness/ sob > [facility] 03/21/18 admit    Admit date: 03/21/2018 Discharge date: 03/22/2018  Brief/Interim Summary:39 y.o.femalewithno significantmedical history presented to the emergency department complaining of shortness of breath associated with cough and chest discomfort. Patientstated symptoms started suddenly 1 day prior to admission. Associated symptoms include suggestive fever and body aches. Also reports dyspnea on exertion, however denies orthopnea and lower extremity edema. Patient used her home inhaler with no relief. Patient denies any sick contacts or recent travel. Patient is active smoker.  ED Course:Upon ED evaluation patient was found to be tachycardic, dyspneic but not hypoxic, she was given albuterol and Solu-Medrol with some improvement. CT of the chest was performed due to concerns of PE however this was negative for blood clot.9mm pulmonary nodule noted on left lung apex. ED physician attempted to discharge patient however she was unable to ambulate without having severe dyspnea.    Discharge Diagnoses:  Active Problems:   Bronchitis 1] acute bronchitis improved with p.o. steroids and inhalers and nebulizing treatments.  She ambulated prior to discharge her saturation was above 98% on room air and after ambulation.  She was able to be discharged home.  I will discharge her on prednisone taper and inhalers she was taking at home.  Patient continues to smoke discussed about smoking cessation.  I have also given her a prescription for Diflucan for 3 days as patient complains of  having pressure every time she goes on steroids.  2] pulmonary nodules 3.5 mm nodules in the left lung apex follow-up CT scan in 12 months patient is a smoker.       04/13/2018  1st pulmoary  Smoker with bronchitis/ finished pred 04/07/18  Chief Complaint  Patient presents with  . Pulmonary Consult    Referred by Dr. Jodi Mourning. Pt c/o SOB and CP since mid July 2019. She states she gets winded just talking.  She has a prod cough with min yellow sputum- worse in the early am and at night She is using albuterol 4-5 x per day.  symptoms started with typical cough over 24 h then admitted as above with neg Cta and much better at [facility] but doe = MMRC3 = can't walk 100 yards even at a slow pace at a flat grade s stopping due to sob  eg [employer] shopping vs baseline able to walk anywhere inside store ie flat /cool environement  Since d/c started on symbicort ? Strength and alb took last  around 4 h prior to OV   Cough: first thing in am yellowish small amt  Sleep: propped up 45 degree SABA use: alb 4-5 x per day  Gen ant cp from coughing fits since admit  rec Plan A = Automatic = Symbicort 80 Take 2 puffs first thing in am and then another 2 puffs about 12 hours later.  Work on inhaler technique:  Plan B = Backup Only use your albuterol (proventil)  as a rescue medication Pantoprazole (protonix) 40 mg   Take  30-60 min before first meal of the day and Pepcid (famotidine)  20 mg one @  bedtime until return to office - this is the best way to tell whether stomach acid is contributing to your problem.   GERD diet. For cough daytime > delsym 2 tsp every 12 hours as needed  Please schedule a follow up office visit in 4 weeks, sooner if needed  with all medications /inhalers/ solutions in hand so we can verify exactly what you are taking. This includes all medications from all doctors and over the counters > did not return  televisit 08/20/2019 Fine until late Oct  2020 on symb 80 2 pffs just hs then bad   head cold > into chest no better with doxy  Dyspnea:  More since onset of head cold  Cough: esp in am brown mucus  Sleeping: worse toward am  SABA use: twice daily now, was not using prior to flare  02: none  rec Plan A = Automatic = Symbicort 80 Take 2 puffs first thing in am and then another 2 puffs about 12 hours later.  Work on Licensed conveyancer B = Backup Only use your albuterol (proventil)  as a rescue medication   Pantoprazole (protonix) 40 mg   Take  30-60 min before first meal of the day and Pepcid (famotidine)  20 mg one after supper until return to office  GERD   For cough   > delsym 2 tsp every 12 hours as needed  Excused from work today - we will put letter in your MyChart to cover this. Please schedule a follow up office visit in 2 weeks,     09/05/2019  f/u ov/Tiffany Conley re: cough relapsed in Oct with head cold  Chief Complaint  Patient presents with  . Follow-up    Wheezing less since last televisit. Still having some cough and SOB. She is using her proair inhaler at least 3 x per day.   Dyspnea: fine if not coughing  Cough:   p stirs in am still brown mucus / assoc nasal congestion Sleeping: on side on flat bed 3 pillows  SABA use: uses 3-4 x per day 02: none  No longer on gerd rx  rec Bed blocks 6-8 inches Pantoprazole 40 mg Take 30-60 min before just the  first meal of the day  Augmentin 875 mg take one pill twice daily  X 10 days - take at breakfast and supper with large glass of water.  It would help reduce the usual side effects (diarrhea and yeast infections) if you ate cultured yogurt at lunch.  Take delsym two tsp every 12 hours and supplement if needed with  tramadol 50 mg up to 2 every 4 hours to suppress the urge to cough.     Depomedrol 120 mg IM      10/10/2019  f/u ov/Tiffany Conley re: cough x 06/2019 maint on gerd rx / symb 80 2bid Chief Complaint  Patient presents with  . Follow-up    Cough has improved some- still has a tickle in her throat occ. She  is using her proair about 3-4 x per wk.   Dyspnea:  Much better/ walking dog neighborhood now Cough: sense of daytime pnds but no excess mucus  Sleeping: on bed blocks / much better  SABA use: as above  02: none    No obvious day to day or daytime variability or assoc excess/ purulent sputum or mucus plugs or hemoptysis or cp or chest tightness, subjective wheeze or overt sinus or hb symptoms.   Sleeping  as above  without nocturnal  or early am exacerbation  of respiratory  c/o's or need for noct saba. Also denies any obvious fluctuation of symptoms with weather or environmental changes or other aggravating or alleviating factors except as outlined above   No unusual exposure hx or h/o childhood pna/ asthma or knowledge of premature birth.  Current Allergies, Complete Past Medical History, Past Surgical History, Family History, and Social History were reviewed in [employer] record.  ROS  The following are not active complaints unless bolded Hoarseness, sore throat, dysphagia, dental problems, itching, sneezing,  nasal congestion or discharge of excess mucus or purulent secretions, ear ache,   fever, chills, sweats, unintended wt loss or wt gain, classically pleuritic or exertional cp,  orthopnea pnd or arm/hand swelling  or leg swelling x years L , presyncope, palpitations, abdominal pain, anorexia, nausea, vomiting, diarrhea  or change in bowel habits or change in bladder habits, change in stools or change in urine, dysuria, hematuria,  rash, arthralgias, visual complaints, headache, numbness, weakness or ataxia or problems with walking or coordination,  change in mood or  memory.        Current Meds  Medication Sig  . albuterol (PROAIR HFA) 108 (90 Base) MCG/ACT inhaler 2 puffs every 4 hours as needed only  if your can't catch your breath  . budesonide-formoterol (SYMBICORT) 80-4.5 MCG/ACT inhaler Take 2 puffs first thing in am and then another 2 puffs about 12 hours  later.  . famotidine (PEPCID) 20 MG tablet One after supper  . fluticasone (FLONASE) 50 MCG/ACT nasal spray Place 2 sprays into both nostrils daily. (Patient taking differently: Place 2 sprays into both nostrils daily as needed. )  . levonorgestrel (MIRENA) 20 MCG/24HR IUD 1 each by Intrauterine route once. Placed June 2020  . pantoprazole (PROTONIX) 40 MG tablet Take 1 tablet (40 mg total) by mouth daily. Take 30-60 min before first meal of the day  . promethazine-codeine (PHENERGAN WITH CODEINE) 6.25-10 MG/5ML syrup Take 5 mLs by mouth every 6 (six) hours as needed for cough.              Past Medical History:  Diagnosis Date  . Headache   . Sciatic nerve pain   . UTI (lower urinary tract infection)           Objective:     amb obese bf nad occ throat clearing / pseudowheeze only    10/10/2019             357   09/05/19 (!) 362 lb (164.2 kg)  06/27/19 (!) 344 lb (156 kg)  11/10/18 (!) 346 lb (156.9 kg)     Vital signs reviewed  10/10/2019  - Note at rest 02 sats  99% on RA     HEENT : pt wearing mask not removed for exam due to covid -19 concerns.    NECK :  without JVD/Nodes/TM/ nl carotid upstrokes bilaterally   LUNGS: no acc muscle use,  Nl contour chest which is clear to A and P bilaterally without cough on insp or exp maneuvers   CV:  RRR  no s3 or murmur or increase in P2, and  1+ pitting L LE  edema   ABD:  Quote obese  and nontender with nl inspiratory excursion in the supine position. No bruits or organomegaly appreciated, bowel sounds nl  MS:  Nl gait/ ext warm without deformities, calf tenderness, cyanosis or clubbing No obvious joint restrictions   SKIN:  warm and dry without lesions    NEURO:  alert, approp, nl sensorium with  no motor or cerebellar deficits apparent.       I personally reviewed images and agree with radiology impression as follows:  CXR:   09/24/19  Mild central airways thickening suggesting bronchitis or reactive airways. No  focal pulmonary infiltrate.       Assessment

## 2019-10-10 NOTE — Assessment & Plan Note (Signed)
Onset 06/2019 in setting of ? Sinusitis / pnds  - 10/10/2019 improved p cyclical cough rx but still throat clearing > rec tessalon 200 mg tid prn  Upper airway cough syndrome (previously labeled PNDS),  is so named because it's frequently impossible to sort out how much is  CR/sinusitis with freq throat clearing (which can be related to primary GERD)   vs  causing  secondary (" extra esophageal")  GERD from wide swings in gastric pressure that occur with throat clearing, often  promoting self use of mint and menthol lozenges that reduce the lower esophageal sphincter tone and exacerbate the problem further in a cyclical fashion.   These are the same pts (now being labeled as having "irritable larynx syndrome" by some cough centers) who not infrequently have a history of having failed to tolerate ace inhibitors,  dry powder inhalers or biphosphonates or report having atypical/extraesophageal reflux symptoms that don't respond to standard doses of PPI  and are easily confused as having aecopd or asthma flares by even experienced allergists/ pulmonologists (myself included).    If not responding to tessalon may need sinus CT/ trial of gabapentin next

## 2019-10-10 NOTE — Telephone Encounter (Signed)
appt scheduled

## 2019-10-10 NOTE — Patient Instructions (Addendum)
Dulera 100 = Symbicort 80  Take 2 puffs first thing in am and then another 2 puffs about 12 hours later.    Only use your albuterol as a rescue medication to be used if you can't catch your breath by resting or doing a relaxed purse lip breathing pattern.  - The less you use it, the better it will work when you need it. - Ok to use up to 2 puffs  every 4 hours if you must but call for immediate appointment if use goes up over your usual need - Don't leave home without it !!  (think of it like the spare tire for your car)    For cough > try tessalon 200 mg every 6-8 hours   Be sure to get the covid 19 vaccine as soon as ordered   Please schedule a follow up visit in 3 months but call sooner if needed

## 2019-10-10 NOTE — Telephone Encounter (Signed)
-----   Message from Nyoka Cowden, MD sent at 10/10/2019  2:14 PM EST ----- Pfts on return

## 2019-10-10 NOTE — Assessment & Plan Note (Signed)
Body mass index is 57.62 kg/m.  -  trending down/ encouraged No results found for: TSH   Contributing to gerd risk/ doe/reviewed the need and the process to achieve and maintain neg calorie balance > defer f/u primary care including intermittently monitoring thyroid status            Each maintenance medication was reviewed in detail including emphasizing most importantly the difference between maintenance and prns and under what circumstances the prns are to be triggered using an action plan format where appropriate.  Total time for H and P, chart review, counseling, teaching device and generating customized AVS unique to this office visit / charting = 30 min

## 2019-10-10 NOTE — Assessment & Plan Note (Addendum)
Flare with rhintis/sinusitis since late Oct 2020  - rx augmentin / max rx for GERD 09/05/2019  - 10/10/2019  After extensive coaching inhaler device,  effectiveness =    90% > continue symb 80 2bid   All goals of chronic asthma control met including optimal function and elimination of symptoms with minimal need for rescue therapy.  Contingencies discussed in full including contacting this office immediately if not controlling the symptoms using the rule of two's.   No need to increase symbicort as just likely to aggravate UACS (see sep a/p)  Re cost of meds: Advised:  formulary restrictions will be an ongoing challenge for the forseable future and I would be happy to pick an alternative if the pt will first  provide me a list of them -  pt  will need to return here for training for any new device that is required eg dpi vs hfa vs respimat.    In the meantime we can always try to provide samples so that the patient never runs out of any needed respiratory medications but the problem she is having is now likely related to her deductible which reset first the year and will apply to all her meds, including generic symbicort 12 (given dulera 100 as an alternative but no samples avail for that here nor symb 80)    Pt informed of the seriousness of COVID 19 infection as a direct risk to lung health  and safey and to close contacts and should continue to wear a facemask in public and minimize exposure to public locations but especially avoid any area or activity where non-close contacts are not observing distancing or wearing an appropriate face mask.  I strongly recommended vaccine when offered.    >>> f/u in 3 months, needs pfts on return

## 2019-12-06 ENCOUNTER — Ambulatory Visit: Payer: 59 | Attending: Internal Medicine

## 2019-12-06 DIAGNOSIS — Z23 Encounter for immunization: Secondary | ICD-10-CM

## 2019-12-06 NOTE — Progress Notes (Signed)
   Covid-19 Vaccination Clinic  Name:  Tiffany Conley    MRN: [id number] DOB: 1980-10-06  12/06/2019  Tiffany Conley was observed post Covid-19 immunization for 15 minutes without incident. She was provided with Vaccine Information Sheet and instruction to access the V-Safe system.   Tiffany Conley was instructed to call 911 with any severe reactions post vaccine: Marland Kitchen Difficulty breathing  . Swelling of face and throat  . A fast heartbeat  . A bad rash all over body  . Dizziness and weakness   Immunizations Administered    Name Date Dose VIS Date Route   Pfizer COVID-19 Vaccine 12/06/2019 10:48 AM 0.3 mL 08/17/2019 Intramuscular   Manufacturer: [employer], [employer]   Lot: [id number]   NDC: [address]-4

## 2019-12-10 NOTE — Telephone Encounter (Signed)
Hi I received my first round of the covid vaccine yesterday and I'm not feeling very well it seems to be normal side effects. I was wondering if I could have a note for work for today.  Thank you in advance.  MW please advise if ok to write a work note.  Thanks!

## 2019-12-10 NOTE — Telephone Encounter (Signed)
Ok to write note to excuse from work today and check with PCP re additional work days if needed but should be much better w/in 24 h)

## 2019-12-11 ENCOUNTER — Other Ambulatory Visit: Payer: Self-pay | Admitting: Family

## 2019-12-11 ENCOUNTER — Ambulatory Visit (INDEPENDENT_AMBULATORY_CARE_PROVIDER_SITE_OTHER): Payer: 59 | Admitting: Family

## 2019-12-11 DIAGNOSIS — J309 Allergic rhinitis, unspecified: Secondary | ICD-10-CM | POA: Diagnosis not present

## 2019-12-11 NOTE — Progress Notes (Signed)
Tiffany Conley is a 39 y.o. female with the following history as recorded in EpicCare:  Patient Active Problem List   Diagnosis Date Noted  . Upper airway cough syndrome 10/10/2019  . Morbid (severe) obesity due to excess calories ([place]) 09/06/2019  . Body mass index (BMI) 50.0-59.9, adult ([place]) 06/27/2019  . Mild persistent asthma, poorly controlled 04/14/2018  . Cigarette smoker 04/14/2018  . Dyspnea on exertion   . Asthmatic bronchitis with acute exacerbation 03/21/2018  . Acute lateral meniscus tear of left knee 08/23/2017  . Diarrhea 06/01/2017  . Nausea with vomiting 04/18/2017  . Low back pain 04/18/2017  . Occipital neuralgia of right side 04/27/2016  . Common migraine without intractability 04/27/2016  . Neck pain 04/27/2016  . 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 INTERNAL DERANGEMENT, RIGHT KNEE 09/08/2010  . Lateral meniscus tear 09/08/2010    Current Outpatient Medications  Medication Sig Dispense Refill  . albuterol (PROAIR HFA) 108 (90 Base) MCG/ACT inhaler 2 puffs every 4 hours as needed only  if your can't catch your breath 1 Inhaler 11  . benzonatate (TESSALON) 200 MG capsule Take 1 capsule (200 mg total) by mouth 3 (three) times daily as needed for cough. 45 capsule 2  . budesonide-formoterol (SYMBICORT) 80-4.5 MCG/ACT inhaler Take 2 puffs first thing in am and then another 2 puffs about 12 hours later. 1 Inhaler 12  . famotidine (PEPCID) 20 MG tablet One after supper 30 tablet 11  . fluticasone (FLONASE) 50 MCG/ACT nasal spray Place 2 sprays into both nostrils daily. (Patient taking differently: Place 2 sprays into both nostrils daily as needed. ) 16 g 6  . levonorgestrel (MIRENA) 20 MCG/24HR IUD 1 each by Intrauterine route once. Placed June 2020    . pantoprazole (PROTONIX) 40 MG tablet Take 1 tablet (40 mg total) by mouth daily. Take 30-60 min before first meal of the day 30 tablet 2  . promethazine-codeine (PHENERGAN WITH CODEINE)  6.25-10 MG/5ML syrup Take 5 mLs by mouth every 6 (six) hours as needed for cough. 240 mL 0   No current facility-administered medications for this visit.    Allergies: Citrus, Ibuprofen, and Vicodin [hydrocodone-acetaminophen]  Past Medical History:  Diagnosis Date  . Headache   . Sciatic nerve pain   . UTI (lower urinary tract infection)     Past Surgical History:  Procedure Laterality Date  . APPENDECTOMY    . CHOLECYSTECTOMY    . ENDOMETRIAL ABLATION    . KNEE ARTHROSCOPY    . SHOULDER SURGERY Left   . TONSILLECTOMY      Family History  Problem Relation Age of Onset  . Colon cancer Maternal Grandmother   . Healthy Mother   . Congestive Heart Failure Father     Social History   Tobacco Use  . Smoking status: Former Smoker    Packs/day: 1.00    Years: 10.00    Pack years: 10.00    Types: Cigarettes    Quit date: 07/08/2019    Years since quitting: 0.4  . Smokeless tobacco: Never Used  Substance Use Topics  . Alcohol use: No    Subjective:    I connected with Tiffany Conley on 12/11/19 at  9:40 AM EDT by a video enabled telemedicine application and verified that I am speaking with the correct person using two identifiers.   I discussed the limitations of evaluation and management by telemedicine and the availability of in person appointments. The patient expressed understanding and agreed to  proceed. Provider in office/ patient is at home; provider and patient are only 2 people on video call.   Feels that she had a reaction to her first COVID shot; took the vaccine on Thusday- her pulmonologist wrote note for her on Friday to cover missed work; she actually has not been able to return to work yet and needs extended note; is feeling better- feeling like she is recovering and will be able to return to work tomorrow;  Also requesting referral to allergist to discuss allergy testing;     Objective:  There were no vitals filed for this visit.  General: Well  developed, well nourished, in no acute distress  Head: Normocephalic and atraumatic  Lungs: Respirations unlabored;  Neurologic: Alert and oriented; speech intact;   Assessment:  1. Allergic rhinitis, unspecified seasonality, unspecified trigger     Plan:  Refer to allergist as requested; follow-up as needed otherwise;   No follow-ups on file.  Orders Placed This Encounter  Procedures  . Ambulatory referral to Allergy    Referral Priority:   Routine    Referral Type:   Allergy Testing    Referral Reason:   Specialty Services Required    Requested Specialty:   Allergy    Number of Visits Requested:   1    Requested Prescriptions    No prescriptions requested or ordered in this encounter

## 2020-01-02 ENCOUNTER — Ambulatory Visit: Payer: 59 | Attending: Internal Medicine

## 2020-01-02 DIAGNOSIS — Z23 Encounter for immunization: Secondary | ICD-10-CM

## 2020-01-02 NOTE — Progress Notes (Signed)
   Covid-19 Vaccination Clinic  Name:  Tiffany Conley    MRN: [id number] DOB: 13-Apr-1981  01/02/2020  [name] was observed post Covid-19 immunization for 30 minutes based on pre-vaccination screening without incident. She was provided with Vaccine Information Sheet and instruction to access the V-Safe system.   Ms. Colon was instructed to call 911 with any severe reactions post vaccine: Marland Kitchen Difficulty breathing  . Swelling of face and throat  . A fast heartbeat  . A bad rash all over body  . Dizziness and weakness   Immunizations Administered    Name Date Dose VIS Date Route   Pfizer COVID-19 Vaccine 01/02/2020 10:57 AM 0.3 mL 10/31/2018 Intramuscular   Manufacturer: [employer], [employer]   Lot: [id number]   NDC: [address]-8

## 2020-01-05 ENCOUNTER — Other Ambulatory Visit (HOSPITAL_COMMUNITY): Payer: 59

## 2020-01-07 ENCOUNTER — Other Ambulatory Visit: Payer: Self-pay

## 2020-01-07 ENCOUNTER — Ambulatory Visit (INDEPENDENT_AMBULATORY_CARE_PROVIDER_SITE_OTHER): Payer: 59 | Admitting: Family

## 2020-01-07 ENCOUNTER — Encounter: Payer: Self-pay | Admitting: Family

## 2020-01-07 VITALS — BP 136/86 | HR 85 | Temp 98.6°F | Ht 66.0 in | Wt 365.4 lb

## 2020-01-07 DIAGNOSIS — L03221 Cellulitis of neck: Secondary | ICD-10-CM

## 2020-01-07 MED ORDER — DOXYCYCLINE HYCLATE 100 MG PO TABS
100.0000 mg | ORAL_TABLET | Freq: Two times a day (BID) | ORAL | 0 refills | Status: DC
Start: 2020-01-07 — End: 2020-01-16

## 2020-01-07 NOTE — Progress Notes (Signed)
Tiffany Conley is a 39 y.o. female with the following history as recorded in EpicCare:  Patient Active Problem List   Diagnosis Date Noted  . Upper airway cough syndrome 10/10/2019  . Morbid (severe) obesity due to excess calories (HCC) 09/06/2019  . Body mass index (BMI) 50.0-59.9, adult (HCC) 06/27/2019  . Mild persistent asthma, poorly controlled 04/14/2018  . Cigarette smoker 04/14/2018  . Dyspnea on exertion   . Asthmatic bronchitis with acute exacerbation 03/21/2018  . Acute lateral meniscus tear of left knee 08/23/2017  . Diarrhea 06/01/2017  . Nausea with vomiting 04/18/2017  . Low back pain 04/18/2017  . Occipital neuralgia of right side 04/27/2016  . Common migraine without intractability 04/27/2016  . Neck pain 04/27/2016  . 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 INTERNAL DERANGEMENT, RIGHT KNEE 09/08/2010  . Lateral meniscus tear 09/08/2010    Current Outpatient Medications  Medication Sig Dispense Refill  . albuterol (PROAIR HFA) 108 (90 Base) MCG/ACT inhaler 2 puffs every 4 hours as needed only  if your can't catch your breath 1 Inhaler 11  . benzonatate (TESSALON) 200 MG capsule Take 1 capsule (200 mg total) by mouth 3 (three) times daily as needed for cough. 45 capsule 2  . cyclobenzaprine (FLEXERIL) 10 MG tablet cyclobenzaprine 10 mg tablet    . famotidine (PEPCID) 20 MG tablet One after supper 30 tablet 11  . fluticasone (FLONASE) 50 MCG/ACT nasal spray Place 2 sprays into both nostrils daily. (Patient taking differently: Place 2 sprays into both nostrils daily as needed. ) 16 g 6  . levonorgestrel (MIRENA) 20 MCG/24HR IUD 1 each by Intrauterine route once. Placed June 2020    . pantoprazole (PROTONIX) 40 MG tablet Take 1 tablet (40 mg total) by mouth daily. Take 30-60 min before first meal of the day 30 tablet 2  . budesonide-formoterol (SYMBICORT) 80-4.5 MCG/ACT inhaler Take 2 puffs first thing in am and then another 2 puffs about 12  hours later. (Patient not taking: Reported on 01/07/2020) 1 Inhaler 12  . doxycycline (VIBRA-TABS) 100 MG tablet Take 1 tablet (100 mg total) by mouth 2 (two) times daily. 20 tablet 0  . naproxen (NAPROSYN) 500 MG tablet naproxen 500 mg tablet     No current facility-administered medications for this visit.    Allergies: Citrus, Ibuprofen, and Vicodin [hydrocodone-acetaminophen]  Past Medical History:  Diagnosis Date  . Headache   . Sciatic nerve pain   . UTI (lower urinary tract infection)     Past Surgical History:  Procedure Laterality Date  . APPENDECTOMY    . CHOLECYSTECTOMY    . ENDOMETRIAL ABLATION    . KNEE ARTHROSCOPY    . SHOULDER SURGERY Left   . TONSILLECTOMY      Family History  Problem Relation Age of Onset  . Colon cancer Maternal Grandmother   . Healthy Mother   . Congestive Heart Failure Father     Social History   Tobacco Use  . Smoking status: Former Smoker    Packs/day: 1.00    Years: 10.00    Pack years: 10.00    Types: Cigarettes    Quit date: 07/08/2019    Years since quitting: 0.5  . Smokeless tobacco: Never Used  Substance Use Topics  . Alcohol use: No    Subjective:  Concern for insect bite on back of her neck; has been having localized pain/ swelling at base of her neck; her mother told her area looked like it has a  puncture wound; no fever; area has felt better with ice than heat; no known tick bite; does take her dogs walking 3 times per day and notes she does have to walk under trees on the walk;   Objective:  Vitals:   01/07/20 1106  BP: 136/86  Pulse: 85  Temp: 98.6 F (37 C)  TempSrc: Oral  SpO2: 97%  Weight: (!) 365 lb 6.4 oz (165.7 kg)  Height: 5\' 6"  (1.676 m)    General: Well developed, well nourished, in no acute distress  Skin : Warm and dry. Localized area c/w puncture wound noted at base of neck with surrounding erythema; no warmth or streaking noted Head: Normocephalic and atraumatic  Lungs: Respirations unlabored;   Neurologic: Alert and oriented; speech intact; face symmetrical; moves all extremities well; CNII-XII intact without focal deficit   Assessment:  1. Cellulitis of neck     Plan:  Suspect secondary to insect bite; continue to take oral antihistamines/ encouraged to apply topical cortisone/ topical antihistamine; Rx for Doxcycline 100 mg bid x 10 days; follow-up worse, no better.  This visit occurred during the SARS-CoV-2 public health emergency.  Safety protocols were in place, including screening questions prior to the visit, additional usage of staff PPE, and extensive cleaning of exam room while observing appropriate contact time as indicated for disinfecting solutions.     No follow-ups on file.  No orders of the defined types were placed in this encounter.   Requested Prescriptions   Signed Prescriptions Disp Refills  . doxycycline (VIBRA-TABS) 100 MG tablet 20 tablet 0    Sig: Take 1 tablet (100 mg total) by mouth 2 (two) times daily.

## 2020-01-09 ENCOUNTER — Ambulatory Visit: Payer: 59 | Admitting: Internal Medicine

## 2020-01-12 ENCOUNTER — Other Ambulatory Visit (HOSPITAL_COMMUNITY)
Admission: RE | Admit: 2020-01-12 | Discharge: 2020-01-12 | Disposition: A | Discharge status: Home / Self Care | Payer: 59 | Source: Ambulatory Visit | Attending: Internal Medicine | Admitting: Internal Medicine

## 2020-01-12 DIAGNOSIS — Z01812 Encounter for preprocedural laboratory examination: Secondary | ICD-10-CM | POA: Diagnosis not present

## 2020-01-12 DIAGNOSIS — Z20822 Contact with and (suspected) exposure to covid-19: Secondary | ICD-10-CM | POA: Insufficient documentation

## 2020-01-12 LAB — SARS CORONAVIRUS 2 (TAT 6-24 HRS): SARS Coronavirus 2: NEGATIVE

## 2020-01-15 ENCOUNTER — Other Ambulatory Visit: Payer: Self-pay | Admitting: *Deleted

## 2020-01-15 DIAGNOSIS — J449 Chronic obstructive pulmonary disease, unspecified: Secondary | ICD-10-CM

## 2020-01-16 ENCOUNTER — Encounter: Payer: Self-pay | Admitting: Allergy

## 2020-01-16 ENCOUNTER — Other Ambulatory Visit: Payer: Self-pay

## 2020-01-16 ENCOUNTER — Encounter: Payer: Self-pay | Admitting: Internal Medicine

## 2020-01-16 ENCOUNTER — Ambulatory Visit (INDEPENDENT_AMBULATORY_CARE_PROVIDER_SITE_OTHER): Payer: 59 | Admitting: Internal Medicine

## 2020-01-16 ENCOUNTER — Ambulatory Visit (INDEPENDENT_AMBULATORY_CARE_PROVIDER_SITE_OTHER): Payer: 59 | Admitting: Allergy

## 2020-01-16 VITALS — BP 118/72 | HR 89 | Temp 97.6°F | Resp 16 | Ht 66.0 in | Wt 364.6 lb

## 2020-01-16 DIAGNOSIS — J4531 Mild persistent asthma with (acute) exacerbation: Secondary | ICD-10-CM

## 2020-01-16 DIAGNOSIS — J449 Chronic obstructive pulmonary disease, unspecified: Secondary | ICD-10-CM | POA: Diagnosis not present

## 2020-01-16 DIAGNOSIS — T781XXD Other adverse food reactions, not elsewhere classified, subsequent encounter: Secondary | ICD-10-CM | POA: Diagnosis not present

## 2020-01-16 DIAGNOSIS — J3089 Other allergic rhinitis: Secondary | ICD-10-CM | POA: Diagnosis not present

## 2020-01-16 DIAGNOSIS — L300 Nummular dermatitis: Secondary | ICD-10-CM

## 2020-01-16 DIAGNOSIS — J4520 Mild intermittent asthma, uncomplicated: Secondary | ICD-10-CM | POA: Diagnosis not present

## 2020-01-16 DIAGNOSIS — H1013 Acute atopic conjunctivitis, bilateral: Secondary | ICD-10-CM | POA: Diagnosis not present

## 2020-01-16 LAB — PULMONARY FUNCTION TEST
DL/VA % pred: 144 %
DL/VA: 6.25 ml/min/mmHg/L
DLCO cor % pred: 107 %
DLCO cor: 26.46 ml/min/mmHg
DLCO unc % pred: 107 %
DLCO unc: 26.46 ml/min/mmHg
FEF 25-75 Post: 3.01 L/sec
FEF 25-75 Pre: 2.76 L/sec
FEF2575-%Change-Post: 9 %
FEF2575-%Pred-Post: 96 %
FEF2575-%Pred-Pre: 88 %
FEV1-%Change-Post: 1 %
FEV1-%Pred-Post: 90 %
FEV1-%Pred-Pre: 89 %
FEV1-Post: 2.6 L
FEV1-Pre: 2.58 L
FEV1FVC-%Change-Post: 2 %
FEV1FVC-%Pred-Pre: 102 %
FEV6-%Change-Post: -1 %
FEV6-%Pred-Post: 86 %
FEV6-%Pred-Pre: 87 %
FEV6-Post: 2.97 L
FEV6-Pre: 3.01 L
FEV6FVC-%Pred-Post: 101 %
FEV6FVC-%Pred-Pre: 101 %
FVC-%Change-Post: -1 %
FVC-%Pred-Post: 84 %
FVC-%Pred-Pre: 85 %
FVC-Post: 2.97 L
FVC-Pre: 3.01 L
Post FEV1/FVC ratio: 88 %
Post FEV6/FVC ratio: 100 %
Pre FEV1/FVC ratio: 86 %
Pre FEV6/FVC Ratio: 100 %
RV % pred: 100 %
RV: 1.75 L
TLC % pred: 83 %
TLC: 4.63 L

## 2020-01-16 MED ORDER — LEVOCETIRIZINE DIHYDROCHLORIDE 5 MG PO TABS: 5.0000 mg | ORAL_TABLET | Freq: Every evening | ORAL | 5 refills | Status: DC

## 2020-01-16 MED ORDER — OLOPATADINE HCL 0.2 % OP SOLN: 1.0000 [drp] | Freq: Every day | OPHTHALMIC | 5 refills | Status: DC

## 2020-01-16 MED ORDER — AZELASTINE-FLUTICASONE 137-50 MCG/ACT NA SUSP
1.0000 | Freq: Two times a day (BID) | NASAL | 5 refills | Status: DC
Start: 2020-01-16 — End: 2020-04-04

## 2020-01-16 MED ORDER — TRIAMCINOLONE ACETONIDE 0.1 % EX OINT: 1.0000 "application " | TOPICAL_OINTMENT | Freq: Two times a day (BID) | CUTANEOUS | 5 refills | Status: DC

## 2020-01-16 NOTE — Progress Notes (Signed)
Tiffany Conley, female    DOB: Sep 28, 1980,     MRN: [id number]   Brief patient profile:  39 yobf quit smoking 07/2019  with asthma as child bad enough to go ER and take allergy shots per Dr Koleen Nimrod age 40-12 and seemed to go away completely then pattern of bad bronchitis starting in 20's(p started smoking)  requiring pred/ abx/ cough meds but no maint rx then new pattern of chest tightness/ sob > [facility] 03/21/18 admit    Admit date: 03/21/2018 Discharge date: 03/22/2018  Brief/Interim Summary:39 y.o.femalewithno significantmedical history presented to the emergency department complaining of shortness of breath associated with cough and chest discomfort. Patientstated symptoms started suddenly 1 day prior to admission. Associated symptoms include suggestive fever and body aches. Also reports dyspnea on exertion, however denies orthopnea and lower extremity edema. Patient used her home inhaler with no relief. Patient denies any sick contacts or recent travel. Patient is active smoker.  ED Course:Upon ED evaluation patient was found to be tachycardic, dyspneic but not hypoxic, she was given albuterol and Solu-Medrol with some improvement. CT of the chest was performed due to concerns of PE however this was negative for blood clot.54mm pulmonary nodule noted on left lung apex. ED physician attempted to discharge patient however she was unable to ambulate without having severe dyspnea.    Discharge Diagnoses:  Active Problems:   Bronchitis 1] acute bronchitis improved with p.o. steroids and inhalers and nebulizing treatments.  She ambulated prior to discharge her saturation was above 98% on room air and after ambulation.  She was able to be discharged home.  I will discharge her on prednisone taper and inhalers she was taking at home.  Patient continues to smoke discussed about smoking cessation.  I have also given her a prescription for Diflucan for 3 days as patient complains of  having pressure every time she goes on steroids.  2] pulmonary nodules 3.5 mm nodules in the left lung apex follow-up CT scan in 12 months patient is a smoker.       04/13/2018  1st pulmoary  Smoker with bronchitis/ finished pred 04/07/18  Chief Complaint  Patient presents with  . Pulmonary Consult    Referred by Dr. Jodi Mourning. Pt c/o SOB and CP since mid July 2019. She states she gets winded just talking.  She has a prod cough with min yellow sputum- worse in the early am and at night She is using albuterol 4-5 x per day.  symptoms started with typical cough over 24 h then admitted as above with neg Cta and much better at [facility] but doe = MMRC3 = can't walk 100 yards even at a slow pace at a flat grade s stopping due to sob  eg [employer] shopping vs baseline able to walk anywhere inside store ie flat /cool environement  Since d/c started on symbicort ? Strength and alb took last  around 4 h prior to OV   Cough: first thing in am yellowish small amt  Sleep: propped up 45 degree SABA use: alb 4-5 x per day  Gen ant cp from coughing fits since admit  rec Plan A = Automatic = Symbicort 80 Take 2 puffs first thing in am and then another 2 puffs about 12 hours later.  Work on inhaler technique:  Plan B = Backup Only use your albuterol (proventil)  as a rescue medication Pantoprazole (protonix) 40 mg   Take  30-60 min before first meal of the day and Pepcid (famotidine)  20 mg one @  bedtime until return to office - this is the best way to tell whether stomach acid is contributing to your problem.   GERD diet. For cough daytime > delsym 2 tsp every 12 hours as needed  Please schedule a follow up office visit in 4 weeks, sooner if needed  with all medications /inhalers/ solutions in hand so we can verify exactly what you are taking. This includes all medications from all doctors and over the counters > did not return  televisit 08/20/2019 Fine until late Oct  2020 on symb 80 2 pffs just hs then bad   head cold > into chest no better with doxy  Dyspnea:  More since onset of head cold  Cough: esp in am brown mucus  Sleeping: worse toward am  SABA use: twice daily now, was not using prior to flare  02: none  rec Plan A = Automatic = Symbicort 80 Take 2 puffs first thing in am and then another 2 puffs about 12 hours later.  Work on Licensed conveyancer B = Backup Only use your albuterol (proventil)  as a rescue medication   Pantoprazole (protonix) 40 mg   Take  30-60 min before first meal of the day and Pepcid (famotidine)  20 mg one after supper until return to office  GERD   For cough   > delsym 2 tsp every 12 hours as needed  Excused from work today - we will put letter in your MyChart to cover this. Please schedule a follow up office visit in 2 weeks,     09/05/2019  f/u ov/Rekha Hobbins re: cough relapsed in Oct with head cold  Chief Complaint  Patient presents with  . Follow-up    Wheezing less since last televisit. Still having some cough and SOB. She is using her proair inhaler at least 3 x per day.   Dyspnea: fine if not coughing  Cough:   p stirs in am still brown mucus / assoc nasal congestion Sleeping: on side on flat bed 3 pillows  SABA use: uses 3-4 x per day 02: none  No longer on gerd rx  rec Bed blocks 6-8 inches Pantoprazole 40 mg Take 30-60 min before just the  first meal of the day  Augmentin 875 mg take one pill twice daily  X 10 days - take at breakfast and supper with large glass of water.  It would help reduce the usual side effects (diarrhea and yeast infections) if you ate cultured yogurt at lunch.  Take delsym two tsp every 12 hours and supplement if needed with  tramadol 50 mg up to 2 every 4 hours to suppress the urge to cough.     Depomedrol 120 mg IM      10/10/2019  f/u ov/Yuma Pacella re: cough x 06/2019 maint on gerd rx / symb 80 2bid Chief Complaint  Patient presents with  . Follow-up    Cough has improved some- still has a tickle in her throat occ. She  is using her proair about 3-4 x per wk.   Dyspnea:  Much better/ walking dog neighborhood now Cough: sense of daytime pnds but no excess mucus  Sleeping: on bed blocks / much better  SABA use: as above  02: none  rec Symbicort 80  Take 2 puffs first thing in am and then another 2 puffs about 12 hours later.  Only use your albuterol as a rescue  For cough > try tessalon 200 mg  every 6-8 hours  Be sure to get the covid 19 vaccine as soon as ordered    off symbicort  Around 1st of May 2021    01/16/2020  f/u ov/Nichlas Pitera re: ? Cough variant asthma/ pfts nl x for low erv Chief Complaint  Patient presents with  . Follow-up    Cough for 3 months, SOB for 6 months, PFT done today   Dyspnea:  Walking dogs x 30 min stops x one  Cough: minimal on zyrtec Sleeping: bed blocks  SABA use: rare 02: none    No obvious day to day or daytime variability or assoc excess/ purulent sputum or mucus plugs or hemoptysis or cp or chest tightness, subjective wheeze or overt sinus or hb symptoms.   Sleeping  without nocturnal  or early am exacerbation  of respiratory  c/o's or need for noct saba. Also denies any obvious fluctuation of symptoms with weather or environmental changes or other aggravating or alleviating factors except as outlined above   No unusual exposure hx or h/o childhood pna/ asthma or knowledge of premature birth.  Current Allergies, Complete Past Medical History, Past Surgical History, Family History, and Social History were reviewed in [employer] record.  ROS  The following are not active complaints unless bolded Hoarseness, sore throat, dysphagia, dental problems, itching, sneezing,  nasal congestion or discharge of excess mucus or purulent secretions, ear ache,   fever, chills, sweats, unintended wt loss or wt gain, classically pleuritic or exertional cp,  orthopnea pnd or arm/hand swelling  or leg swelling, presyncope, palpitations, abdominal pain, anorexia,  nausea, vomiting, diarrhea  or change in bowel habits or change in bladder habits, change in stools or change in urine, dysuria, hematuria,  rash, arthralgias, visual complaints, headache, numbness, weakness or ataxia or problems with walking or coordination,  change in mood or  memory.        Current Meds  Medication Sig  . benzonatate (TESSALON) 200 MG capsule Take 1 capsule (200 mg total) by mouth 3 (three) times daily as needed for cough.  . famotidine (PEPCID) 20 MG tablet One after supper  . fluticasone (FLONASE) 50 MCG/ACT nasal spray Place 2 sprays into both nostrils daily. (Patient taking differently: Place 2 sprays into both nostrils daily as needed. )  . levonorgestrel (MIRENA) 20 MCG/24HR IUD 1 each by Intrauterine route once. Placed June 2020  . naproxen (NAPROSYN) 500 MG tablet naproxen 500 mg tablet  . pantoprazole (PROTONIX) 40 MG tablet Take 1 tablet (40 mg total) by mouth daily. Take 30-60 min before first meal of the day            Past Medical History:  Diagnosis Date  . Headache   . Sciatic nerve pain   . UTI (lower urinary tract infection)           Objective:    amb pleasant obese bf nad   01/16/2020            363   10/10/2019             357   09/05/19 (!) 362 lb (164.2 kg)  06/27/19 (!) 344 lb (156 kg)  11/10/18 (!) 346 lb (156.9 kg)      Vital signs reviewed  01/16/2020  - Note at rest 02 sats  98% on RA    HEENT : pt wearing mask not removed for exam due to covid -19 concerns.    NECK :  without JVD/Nodes/TM/ nl carotid upstrokes  bilaterally   LUNGS: no acc muscle use,  Nl contour chest which is clear to A and P bilaterally without cough on insp or exp maneuvers   CV:  RRR  no s3 or murmur or increase in P2, and no edema   ABD:  Very obese but soft and nontender with nl inspiratory excursion in the supine position. No bruits or organomegaly appreciated, bowel sounds nl  MS:  Nl gait/ ext warm without deformities, calf tenderness, cyanosis  or clubbing No obvious joint restrictions   SKIN: warm and dry without lesions    NEURO:  alert, approp, nl sensorium with  no motor or cerebellar deficits apparent.                  Assessment

## 2020-01-16 NOTE — Progress Notes (Signed)
New Patient Note  RE: Copeland Lapier MRN: [id number] DOB: [birth year] Date of Office Visit: 01/16/2020  Referring provider: Olive Bass,* Primary care provider: Olive Bass, FNP  Chief Complaint: allergies  History of present illness: Tiffany Conley is a 39 y.o. female presenting today for consultation for allergies.  She reports itchy and watery eyes, burning eyes, runny nose, stuffy nose, cough, itchy skin, ear fullness, frontal headaches.  She does report getting 2-3 sinus infections per year treated with a round of antibitics and/or steroids if leads into respiratory illness.  Feels her allergy symptoms are year-round.   Takes zyrtec for years; does not take year-round but usually start daily in beginning April -June.  May take sudafed as needed.   Will use flonase as needed usually before bedtime and does feel helps with congestion.  She will use Bausch&Lamb allergy drop.  Has never tried nasal saline rinse.  She did do allergy shots as a child and states it was helpful then.      She does take pepcid and a PPI (pantoprazole) for reflux control.   She states her skin itches especially on her arms, neck and back.  She has noted some rough dry patches on her arms.  Uses a mixture of vaseline, vitamin E, coco butter, argon oil, tea tree oil and hemp oil for moisturization.   She states citrus makes her itch all over.  Apples, kiwi, and pineapple make her mouth itch.  This has been ongoing for years.   She does have asthma and follows with Dr. Sherene Sires in [facility] pul[facility]ology.  She had an appt earlier today.  She states she no longer needs to use symbicort and has her rescue albuterol inhaler.    She is not sure if she has ever used singulair in the past.    Review of systems: Review of Systems  Constitutional: Negative.   HENT:       See HPI  Eyes:       See HPI  Respiratory: Negative.   Cardiovascular: Negative.   Gastrointestinal: Negative.     Musculoskeletal: Negative.   Skin: Positive for itching and rash.  Neurological: Negative.     All other systems negative unless noted above in HPI  Past medical history: Past Medical History:  Diagnosis Date  . Asthma   . Headache   . Sciatic nerve pain   . UTI (lower urinary tract infection)     Past surgical history: Past Surgical History:  Procedure Laterality Date  . APPENDECTOMY    . CHOLECYSTECTOMY    . ENDOMETRIAL ABLATION    . KNEE ARTHROSCOPY    . SHOULDER SURGERY Left   . TONSILLECTOMY      Family history:  Family History  Problem Relation Age of Onset  . Colon cancer Maternal Grandmother   . Healthy Mother   . Allergic rhinitis Mother   . Congestive Heart Failure Father   . Allergic rhinitis Sister     Social history: Lives in an apartment with carpeting with electric heating and central cooling.  Dogs in the home.  Cats and dogs outside the home.  No concern for water damage, mildew or roaches in the home.  She is a Clinical biochemist rep.    Tobacco Use  . Smoking status: Former Smoker    Packs/day: 1.00    Years: 10.00    Pack years: 10.00    Types: Cigarettes    Quit date: 07/08/2019    Years since  quitting: 0.5  . Smokeless tobacco: Never Used     Medication List: Current Outpatient Medications  Medication Sig Dispense Refill  . albuterol (PROAIR HFA) 108 (90 Base) MCG/ACT inhaler 2 puffs every 4 hours as needed only  if your can't catch your breath 1 Inhaler 11  . benzonatate (TESSALON) 200 MG capsule Take 1 capsule (200 mg total) by mouth 3 (three) times daily as needed for cough. 45 capsule 2  . famotidine (PEPCID) 20 MG tablet One after supper 30 tablet 11  . fluticasone (FLONASE) 50 MCG/ACT nasal spray Place 2 sprays into both nostrils daily. (Patient taking differently: Place 2 sprays into both nostrils daily as needed. ) 16 g 6  . levonorgestrel (MIRENA) 20 MCG/24HR IUD 1 each by Intrauterine route once. Placed June 2020    . naproxen  (NAPROSYN) 500 MG tablet naproxen 500 mg tablet    . pantoprazole (PROTONIX) 40 MG tablet Take 1 tablet (40 mg total) by mouth daily. Take 30-60 min before first meal of the day 30 tablet 2   No current facility-administered medications for this visit.    Known medication allergies: Allergies  Allergen Reactions  . Citrus Itching and Rash  . Ibuprofen Nausea Only  . Vicodin [Hydrocodone-Acetaminophen] Nausea And Vomiting    Physical examination: Blood pressure 118/72, pulse 89, temperature 97.6 F (36.4 C), temperature source Temporal, resp. rate 16, height 5\' 6"  (1.676 m), weight (!) 364 lb 9.6 oz (165.4 kg), SpO2 99 %.  General: Alert, interactive, in no acute distress. HEENT: PERRLA, TMs pearly gray, turbinates moderately edematous without discharge with nasal crease, post-pharynx non erythematous. Neck: Supple without lymphadenopathy. Lungs: Clear to auscultation without wheezing, rhonchi or rales. {no increased work of breathing. CV: Normal S1, S2 without murmurs. Abdomen: Nondistended, nontender. Skin: Warm and dry, without lesions or rashes. Extremities:  No clubbing, cyanosis or edema. Neuro:   Grossly intact.  Diagnositics/Labs:  Allergy testing: environmental allergy skin prick testing is positive to blue, meadow fescue, perennial rye, Timothy, rough pigweed, ash, birch, beech, [place], [place], [place], [place], [place], sycamore, walnut, pullulara, [place]. Intradermal testing is positive to Film/video editor, johnson, dog  Select food allergy skin prick testing is positive to orange and negative to apple and pineapple. Allergy testing results were read and interpreted by provider, documented by clinical staff.   Assessment and plan:   Allergic rhinitis with conjunctivitis  - environmental allergy skin testing today is positive to grasses, weeds, trees, mold, cat, dog  - allergen avoidance measures discussed/handouts provided  - recommend changing Zyrtec to  another long-acting antihistamine either Allegra 180mg  or Xyzal 5mg  daily  - recommend trial of dymista 1 spray each nostril twice a day.  This is a combination nasal spray with Flonase + Astelin (nasal antihistamine).  This helps with both nasal congestion and drainage.   - for itchy/watery eyes recommend use of over-the-counter Pataday or Pataday Xtra strength 1 drop each eye daily as needed  - recommend performing nasal saline rinses which helps to flush out the sinuses and allows for your medicated nasal sprays to be more effective.  Nasal saline rinse should be performed with distilled water or boil water and bring to room temp before use.  Breathe in and out mouth during the rinse.  Best done with a squeeze bottle.    - allergen immunotherapy discussed today including protocol, benefits and risk.  Informational handout provided.  If interested in this therapuetic option you can check with your insurance carrier for  coverage.  Let us know if you would like to proceed with this option.    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   Negative to pineapple and apple which is likely the PFAS as below  - recommend avoidance of oranges to help prevent itching  - The oral allergy syndrome (OAS) or pollen-food allergy syndrome (PFAS) is a relatively common form of food allergy, particularly in adults. It typically occurs in people who have pollen allergies when the immune system "sees" proteins on the food that look like proteins on the pollen. This results in the allergy antibody (IgE) binding to the food instead of the pollen. Patients typically report itching and/or mild swelling of the mouth and throat immediately following ingestion of certain uncooked fruits (including nuts) or raw vegetables. Only a very small number of affected individuals experience systemic allergic reactions, such as anaphylaxis which occurs with true food allergies.   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  Use triamcinolone 1 application twice a day as needed  Asthmatic bronchitis  - have access to albuterol inhaler 2 puffs every 4-6 hours as needed for cough/wheeze/shortness of breath/chest tightness.  May use 15-20 minutes prior to activity.   Monitor frequency of use.    - continue recommendations by Dr. Melvyn Novas   - continue follow-up with Dr. Melvyn Novas as directed  Follow-up 4 months or sooner if needed  I appreciate the opportunity to take part in Nashley's care. Please do not hesitate to contact me with questions.  Sincerely,   Prudy Feeler, MD Allergy/Immunology [facility] [place]

## 2020-01-16 NOTE — Patient Instructions (Addendum)
Allergies  - environmental allergy skin testing today is positive to grasses, weeds, trees, mold, cat, dog  - allergen avoidance measures discussed/handouts provided  - recommend changing Zyrtec to another long-acting antihistamine either Allegra 180mg  or Xyzal 5mg  daily  - recommend trial of dymista 1 spray each nostril twice a day.  This is a combination nasal spray with Flonase + Astelin (nasal antihistamine).  This helps with both nasal congestion and drainage.   - for itchy/watery eyes recommend use of over-the-counter Pataday or Pataday Xtra strength 1 drop each eye daily as needed  - recommend performing nasal saline rinses which helps to flush out the sinuses and allows for your medicated nasal sprays to be more effective.  Nasal saline rinse should be performed with distilled water or boil water and bring to room temp before use.  Breathe in and out mouth during the rinse.  Best done with a squeeze bottle.    - allergen immunotherapy discussed today including protocol, benefits and risk.  Informational handout provided.  If interested in this therapuetic option you can check with your insurance carrier for coverage.  Let know if you would like to proceed with this option.    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   Negative to pineapple and apple which is likely the PFAS as below  - recommend avoidance of oranges to help prevent itching  - The oral allergy syndrome (OAS) or pollen-food allergy syndrome (PFAS) is a relatively common form of food allergy, particularly in adults. It typically occurs in people who have pollen allergies when the immune system "sees" proteins on the food that look like proteins on the pollen. This results in the allergy antibody (IgE) binding to the food instead of the pollen. Patients typically report itching and/or mild swelling of the mouth and throat immediately following ingestion of certain  uncooked fruits (including nuts) or raw vegetables. Only a very small number of affected individuals experience systemic allergic reactions, such as anaphylaxis which occurs with true food allergies.  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  Use triamcinolone 1 application twice a day as needed  Asthma  - have access to albuterol inhaler 2 puffs every 4-6 hours as needed for cough/wheeze/shortness of breath/chest tightness.  May use 15-20 minutes prior to activity.   Monitor frequency of use.    - continue recommendations by Dr.   - continue follow-up with Dr. [place] as directed  Follow-up 4 months or sooner if needed

## 2020-01-16 NOTE — Patient Instructions (Addendum)
While off the symbicort you do not have any airflow obstruction but you could still have asthma   Only use your albuterol as a rescue medication to be used if you can't catch your breath by resting or doing a relaxed purse lip breathing pattern.  - The less you use it, the better it will work when you need it. - Ok to use up to 2 puffs  every 4 hours if you must but call for immediate appointment if use goes up over your usual need - Don't leave home without it !!  (think of it like the spare tire for your car)   Pulmonary follow up can be as needed since being followed by Allergy

## 2020-01-16 NOTE — Progress Notes (Signed)
Full PFT performed today. °

## 2020-01-17 ENCOUNTER — Encounter: Payer: Self-pay | Admitting: Internal Medicine

## 2020-01-17 NOTE — Assessment & Plan Note (Addendum)
PFTs 01/16/2020 nl x low ERV c/w body habitus  Body mass index is 58.59 kg/m.  -  trending up  No results found for: TSH   Contributing to gerd risk/ doe/reviewed the need and the process to achieve and maintain neg calorie balance > defer f/u primary care including intermittently monitoring thyroid status    Pulmonary f/u is as needed         Each maintenance medication was reviewed in detail including emphasizing most importantly the difference between maintenance and prns and under what circumstances the prns are to be triggered using an action plan format where appropriate.  Total time for H and P, chart review, counseling,  and generating customized AVS unique to this office visit / charting = 20 min

## 2020-01-17 NOTE — Assessment & Plan Note (Signed)
Flare with rhintis/sinusitis since late Oct 2020  - rx augmentin / max rx for GERD 09/05/2019  - 10/10/2019  After extensive coaching inhaler device,  effectiveness =    90% > continue symb 80 2bid  - 01/16/2020 pfts nl off symbicort x 2 weeks  Most likely has intermittent asthma and can just use symbicort prn Based on two studies from NEJM  378; 20 p 1865 (2018) and 380 : p2020-30 (2019) in pts with mild asthma it is reasonable to use low dose symbicort eg 80 2bid "prn" flare in this setting but I emphasized this was only shown with symbicort and takes advantage of the rapid onset of action but is not the same as "rescue therapy" but can be stopped once the acute symptoms have resolved and the need for rescue has been minimized (< 2 x weekly)     Allergy f/u planned/ pulmonary is prn

## 2020-01-21 ENCOUNTER — Encounter: Payer: Self-pay | Admitting: Family

## 2020-01-21 ENCOUNTER — Other Ambulatory Visit: Payer: Self-pay

## 2020-01-21 ENCOUNTER — Ambulatory Visit (INDEPENDENT_AMBULATORY_CARE_PROVIDER_SITE_OTHER): Payer: 59 | Admitting: Family

## 2020-01-21 VITALS — BP 116/72 | HR 87 | Temp 98.2°F | Ht 66.0 in | Wt 364.2 lb

## 2020-01-21 DIAGNOSIS — L0291 Cutaneous abscess, unspecified: Secondary | ICD-10-CM | POA: Diagnosis not present

## 2020-01-21 MED ORDER — SULFAMETHOXAZOLE-TRIMETHOPRIM 800-160 MG PO TABS
1.0000 | ORAL_TABLET | Freq: Two times a day (BID) | ORAL | 0 refills | Status: DC
Start: 2020-01-21 — End: 2020-04-04

## 2020-01-21 NOTE — Progress Notes (Signed)
Tiffany Conley is a 39 y.o. female with the following history as recorded in EpicCare:  Patient Active Problem List   Diagnosis Date Noted  . Upper airway cough syndrome 10/10/2019  . Morbid (severe) obesity due to excess calories (HCC) 09/06/2019  . Body mass index (BMI) 50.0-59.9, adult (HCC) 06/27/2019  . Mild persistent asthma, poorly controlled 04/14/2018  . Cigarette smoker 04/14/2018  . Dyspnea on exertion   . Asthmatic bronchitis with acute exacerbation 03/21/2018  . Acute lateral meniscus tear of left knee 08/23/2017  . Diarrhea 06/01/2017  . Nausea with vomiting 04/18/2017  . Low back pain 04/18/2017  . Occipital neuralgia of right side 04/27/2016  . Common migraine without intractability 04/27/2016  . Neck pain 04/27/2016  . 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 INTERNAL DERANGEMENT, RIGHT KNEE 09/08/2010  . Lateral meniscus tear 09/08/2010    Current Outpatient Medications  Medication Sig Dispense Refill  . albuterol (PROAIR HFA) 108 (90 Base) MCG/ACT inhaler 2 puffs every 4 hours as needed only  if your can't catch your breath 1 Inhaler 11  . Azelastine-Fluticasone 137-50 MCG/ACT SUSP Place 1 spray into the nose in the morning and at bedtime. 23 g 5  . famotidine (PEPCID) 20 MG tablet One after supper 30 tablet 11  . fluticasone (FLONASE) 50 MCG/ACT nasal spray Place 2 sprays into both nostrils daily. (Patient taking differently: Place 2 sprays into both nostrils daily as needed. ) 16 g 6  . levocetirizine (XYZAL) 5 MG tablet Take 1 tablet (5 mg total) by mouth every evening. 30 tablet 5  . levonorgestrel (MIRENA) 20 MCG/24HR IUD 1 each by Intrauterine route once. Placed June 2020    . naproxen (NAPROSYN) 500 MG tablet naproxen 500 mg tablet    . Olopatadine HCl 0.2 % SOLN Apply 1 drop to eye daily. 2.5 mL 5  . pantoprazole (PROTONIX) 40 MG tablet Take 1 tablet (40 mg total) by mouth daily. Take 30-60 min before first meal of the day 30  tablet 2  . triamcinolone ointment (KENALOG) 0.1 % Apply 1 application topically 2 (two) times daily. 454 g 5  . benzonatate (TESSALON) 200 MG capsule Take 1 capsule (200 mg total) by mouth 3 (three) times daily as needed for cough. (Patient not taking: Reported on 01/21/2020) 45 capsule 2  . sulfamethoxazole-trimethoprim (BACTRIM DS) 800-160 MG tablet Take 1 tablet by mouth 2 (two) times daily. 14 tablet 0   No current facility-administered medications for this visit.    Allergies: Citrus, Ibuprofen, and Vicodin [hydrocodone-acetaminophen]  Past Medical History:  Diagnosis Date  . Asthma   . Headache   . Sciatic nerve pain   . UTI (lower urinary tract infection)     Past Surgical History:  Procedure Laterality Date  . APPENDECTOMY    . CHOLECYSTECTOMY    . ENDOMETRIAL ABLATION    . KNEE ARTHROSCOPY    . SHOULDER SURGERY Left   . TONSILLECTOMY      Family History  Problem Relation Age of Onset  . Colon cancer Maternal Grandmother   . Healthy Mother   . Allergic rhinitis Mother   . Congestive Heart Failure Father   . Allergic rhinitis Sister     Social History   Tobacco Use  . Smoking status: Former Smoker    Packs/day: 1.00    Years: 10.00    Pack years: 10.00    Types: Cigarettes    Quit date: 07/08/2019    Years since quitting: 0.5  .  Smokeless tobacco: Never Used  Substance Use Topics  . Alcohol use: No    Subjective:  Worsening skin infection on back of neck; is prone to abscesses; has been treated with Doxycycline with some benefit; Also wants to talk about options to help with her weight; BMI is 58.78; already having hip and back pain and knows that weight is aggravating her issues; not a surgical candidate until weight comes down;     Objective:  Vitals:   01/21/20 1056  BP: 116/72  Pulse: 87  Temp: 98.2 F (36.8 C)  TempSrc: Oral  SpO2: 99%  Weight: (!) 364 lb 3.2 oz (165.2 kg)  Height: 5\' 6"  (1.676 m)    General: Well developed, well nourished,  in no acute distress  Skin : Warm and dry. Localized area of infection on back of neck Head: Normocephalic and atraumatic  Lungs: Respirations unlabored; Neurologic: Alert and oriented; speech intact; face symmetrical; moves all extremities well; CNII-XII intact without focal deficit   Assessment:  1. Abscess   2. Morbid obesity ([place])     Plan:  1. Change to Bactrim DS bid x 7 days; refer to surgery for I & D; 2. Refer to weight management;  This visit occurred during the SARS-CoV-2 public health emergency.  Safety protocols were in place, including screening questions prior to the visit, additional usage of staff PPE, and extensive cleaning of exam room while observing appropriate contact time as indicated for disinfecting solutions.     No follow-ups on file.  Orders Placed This Encounter  Procedures  . Ambulatory referral to General Surgery    Referral Priority:   Urgent    Referral Type:   Surgical    Referral Reason:   Specialty Services Required    Requested Specialty:   General Surgery    Number of Visits Requested:   1  . Amb Ref to Medical Weight Management    Referral Priority:   Routine    Referral Type:   Consultation    Number of Visits Requested:   1    Requested Prescriptions   Signed Prescriptions Disp Refills  . sulfamethoxazole-trimethoprim (BACTRIM DS) 800-160 MG tablet 14 tablet 0    Sig: Take 1 tablet by mouth 2 (two) times daily.

## 2020-02-19 ENCOUNTER — Encounter (INDEPENDENT_AMBULATORY_CARE_PROVIDER_SITE_OTHER): Payer: Self-pay

## 2020-03-03 ENCOUNTER — Encounter (INDEPENDENT_AMBULATORY_CARE_PROVIDER_SITE_OTHER): Payer: Self-pay

## 2020-03-14 ENCOUNTER — Encounter: Payer: Self-pay | Admitting: Family

## 2020-04-04 ENCOUNTER — Telehealth (INDEPENDENT_AMBULATORY_CARE_PROVIDER_SITE_OTHER): Payer: Self-pay | Admitting: Family

## 2020-04-04 ENCOUNTER — Other Ambulatory Visit: Payer: Self-pay | Admitting: Family

## 2020-04-04 ENCOUNTER — Encounter: Payer: Self-pay | Admitting: Family

## 2020-04-04 DIAGNOSIS — J069 Acute upper respiratory infection, unspecified: Secondary | ICD-10-CM

## 2020-04-04 MED ORDER — AZITHROMYCIN 250 MG PO TABS: ORAL_TABLET | ORAL | 0 refills | Status: DC

## 2020-04-04 NOTE — Progress Notes (Signed)
Tiffany Conley is a 39 y.o. female with the following history as recorded in EpicCare:  Patient Active Problem List   Diagnosis Date Noted  . Upper airway cough syndrome 10/10/2019  . Morbid (severe) obesity due to excess calories (HCC) 09/06/2019  . Body mass index (BMI) 50.0-59.9, adult (HCC) 06/27/2019  . Mild persistent asthma, poorly controlled 04/14/2018  . Cigarette smoker 04/14/2018  . Dyspnea on exertion   . Asthmatic bronchitis with acute exacerbation 03/21/2018  . Acute lateral meniscus tear of left knee 08/23/2017  . Diarrhea 06/01/2017  . Nausea with vomiting 04/18/2017  . Low back pain 04/18/2017  . Occipital neuralgia of right side 04/27/2016  . Common migraine without intractability 04/27/2016  . Neck pain 04/27/2016  . 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 INTERNAL DERANGEMENT, RIGHT KNEE 09/08/2010  . Lateral meniscus tear 09/08/2010    Current Outpatient Medications  Medication Sig Dispense Refill  . albuterol (PROAIR HFA) 108 (90 Base) MCG/ACT inhaler 2 puffs every 4 hours as needed only  if your can't catch your breath 1 Inhaler 11  . azithromycin (ZITHROMAX) 250 MG tablet 2 tabs po qd x 1 day; 1 tablet per day x 4 days; 6 tablet 0  . famotidine (PEPCID) 20 MG tablet One after supper 30 tablet 11  . fluticasone (FLONASE) 50 MCG/ACT nasal spray Place 2 sprays into both nostrils daily. (Patient taking differently: Place 2 sprays into both nostrils daily as needed. ) 16 g 6  . levonorgestrel (MIRENA) 20 MCG/24HR IUD 1 each by Intrauterine route once. Placed June 2020    . naproxen (NAPROSYN) 500 MG tablet naproxen 500 mg tablet    . Olopatadine HCl 0.2 % SOLN Apply 1 drop to eye daily. 2.5 mL 5  . triamcinolone ointment (KENALOG) 0.1 % Apply 1 application topically 2 (two) times daily. 454 g 5   No current facility-administered medications for this visit.    Allergies: Citrus, Ibuprofen, and Vicodin [hydrocodone-acetaminophen]   Past Medical History:  Diagnosis Date  . Asthma   . Headache   . Sciatic nerve pain   . UTI (lower urinary tract infection)     Past Surgical History:  Procedure Laterality Date  . APPENDECTOMY    . CHOLECYSTECTOMY    . ENDOMETRIAL ABLATION    . KNEE ARTHROSCOPY    . SHOULDER SURGERY Left   . TONSILLECTOMY      Family History  Problem Relation Age of Onset  . Colon cancer Maternal Grandmother   . Healthy Mother   . Allergic rhinitis Mother   . Congestive Heart Failure Father   . Allergic rhinitis Sister     Social History   Tobacco Use  . Smoking status: Former Smoker    Packs/day: 1.00    Years: 10.00    Pack years: 10.00    Types: Cigarettes    Quit date: 07/08/2019    Years since quitting: 0.7  . Smokeless tobacco: Never Used  Substance Use Topics  . Alcohol use: No    Subjective:   I connected with Tiffany Conley on 04/04/20 at 10:00 AM EDT by a video enabled telemedicine application and verified that I am speaking with the correct person using two identifiers.   I discussed the limitations of evaluation and management by telemedicine and the availability of in person appointments. The patient expressed understanding and agreed to proceed. Provider in office/ patient is at home; provider and patient are only 2 people on video call.  Cough/ cold symptoms x 5 days; +  diarrhea; + fever; + cough/ fever;  Did get a rapid COVID test yesterday which was negative;  Notes that symptoms are actually starting to improve; needs a work note to cover missed time this week;    Objective:  There were no vitals filed for this visit.  General: Well developed, well nourished, in no acute distress  Head: Normocephalic and atraumatic  Lungs: Respirations unlabored; Neurologic: Alert and oriented; speech intact; face symmetrical; moves all extremities well; CNII-XII intact without focal deficit   Assessment:  1. Viral URI with cough     Plan:  Had negative COVID  testing yesterday; is improving and feels like she will be able to return to work on Monday; work note given as requested; Rx for Z-pak to hold and fill only if worsening in the next 24-48 hours. Increase fluids, rest and follow up worse, no better.   No follow-ups on file.  No orders of the defined types were placed in this encounter.   Requested Prescriptions   Signed Prescriptions Disp Refills  . azithromycin (ZITHROMAX) 250 MG tablet 6 tablet 0    Sig: 2 tabs po qd x 1 day; 1 tablet per day x 4 days;

## 2020-04-08 ENCOUNTER — Encounter: Payer: Self-pay | Admitting: Family

## 2020-04-08 NOTE — Telephone Encounter (Signed)
Pt saw Vernona Rieger on Friday she is wanting to know if she can get the work note extended for me to go back tomorrow 8/4. She states she went to work yesterday but was sent home due to my cough. She states she is still currently taking medications that was rx by Vernona Rieger.Marland KitchenRaechel Chute

## 2020-05-22 ENCOUNTER — Ambulatory Visit: Payer: 59 | Admitting: Allergy

## 2021-07-24 ENCOUNTER — Other Ambulatory Visit: Payer: Self-pay

## 2021-07-24 ENCOUNTER — Encounter (HOSPITAL_BASED_OUTPATIENT_CLINIC_OR_DEPARTMENT_OTHER): Payer: Self-pay | Admitting: Emergency Medicine

## 2021-07-24 ENCOUNTER — Emergency Department (HOSPITAL_BASED_OUTPATIENT_CLINIC_OR_DEPARTMENT_OTHER)
Admission: EM | Admit: 2021-07-24 | Discharge: 2021-07-24 | Disposition: A | Discharge status: Home / Self Care | Payer: BC Managed Care – PPO | Attending: Emergency Medicine | Admitting: Emergency Medicine

## 2021-07-24 DIAGNOSIS — Z87891 Personal history of nicotine dependence: Secondary | ICD-10-CM | POA: Insufficient documentation

## 2021-07-24 DIAGNOSIS — L723 Sebaceous cyst: Secondary | ICD-10-CM | POA: Diagnosis not present

## 2021-07-24 DIAGNOSIS — J4530 Mild persistent asthma, uncomplicated: Secondary | ICD-10-CM | POA: Diagnosis not present

## 2021-07-24 DIAGNOSIS — L0211 Cutaneous abscess of neck: Secondary | ICD-10-CM | POA: Diagnosis not present

## 2021-07-24 NOTE — ED Provider Notes (Signed)
[facility] EMERGENCY DEPT Provider Note  CSN: [id number] Arrival date & time: 07/24/21 1919  Chief Complaint(s) Abscess  HPI Tiffany Conley is a 40 y.o. female    Abscess Location:  Head/neck Head/neck abscess location: lower midline neck. Abscess quality: draining, induration and painful   Red streaking: no   Duration: 1 yr. Progression since onset: fluctuating. Pain details:    Quality:  Throbbing and pressure   Severity:  Moderate   Timing:  Intermittent   Progression:  Waxing and waning Chronicity:  Recurrent Context: not diabetes, not immunosuppression and not injected drug use   Relieved by:  Draining/squeezing Worsened by:  Nothing Associated symptoms: no fatigue, no fever, no headaches, no nausea and no vomiting    Past Medical History Past Medical History:  Diagnosis Date   Asthma    Headache    Sciatic nerve pain    UTI (lower urinary tract infection)    Patient Active Problem List   Diagnosis Date Noted   Upper airway cough syndrome 10/10/2019   Morbid (severe) obesity due to excess calories (HCC) 09/06/2019   Body mass index (BMI) 50.0-59.9, adult (HCC) 06/27/2019   Mild persistent asthma, poorly controlled 04/14/2018   Cigarette smoker 04/14/2018   Dyspnea on exertion    Asthmatic bronchitis with acute exacerbation 03/21/2018   Acute lateral meniscus tear of left knee 08/23/2017   Diarrhea 06/01/2017   Nausea with vomiting 04/18/2017   Low back pain 04/18/2017   Occipital neuralgia of right side 04/27/2016   Common migraine without intractability 04/27/2016   Neck pain 04/27/2016   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 Authorizing Provider  albuterol (PROAIR HFA) 108 (90 Base) MCG/ACT inhaler 2 puffs every 4 hours as needed  only  if your can't catch your breath 04/13/18   Nyoka Cowden, MD  azithromycin (ZITHROMAX) 250 MG tablet 2 tabs po qd x 1 day; 1 tablet per day x 4 days; 04/04/20   Olive Bass, FNP  famotidine (PEPCID) 20 MG tablet One after supper 08/20/19   Nyoka Cowden, MD  fluticasone [facility]) 50 MCG/ACT nasal spray Place 2 sprays into both nostrils daily. Patient taking differently: Place 2 sprays into both nostrils daily as needed.  11/10/18   Olive Bass, FNP  levonorgestrel (MIRENA) 20 MCG/24HR IUD 1 each by Intrauterine route once. Placed June 2020    [provider]  naproxen (NAPROSYN) 500 MG tablet naproxen 500 mg tablet    [provider]  Olopatadine HCl 0.2 % SOLN Apply 1 drop to eye daily. 01/16/20   Marcelyn Bruins, MD  triamcinolone ointment (KENALOG) 0.1 % Apply 1 application topically 2 (two) times daily. 01/16/20   Marcelyn Bruins, MD  Past Surgical History Past Surgical History:  Procedure Laterality Date   APPENDECTOMY     CHOLECYSTECTOMY     ENDOMETRIAL ABLATION     KNEE ARTHROSCOPY     SHOULDER SURGERY Left    TONSILLECTOMY     Family History Family History  Problem Relation Age of Onset   Colon cancer Maternal Grandmother    Healthy Mother    Allergic rhinitis Mother    Congestive Heart Failure Father    Allergic rhinitis Sister     Social History Social History   Tobacco Use   Smoking status: Former    Packs/day: 1.00    Years: 10.00    Pack years: 10.00    Types: Cigarettes    Quit date: 07/08/2019    Years since quitting: 2.0   Smokeless tobacco: Never  Vaping Use   Vaping Use: Never used  Substance Use Topics   Alcohol use: No   Drug use: Yes    Frequency: 7.0 times per week    Types: Marijuana   Allergies Citrus, Ibuprofen, and Vicodin  [hydrocodone-acetaminophen]  Review of Systems Review of Systems  Constitutional:  Negative for fatigue and fever.  Gastrointestinal:  Negative for nausea and vomiting.  Neurological:  Negative for headaches.  All other systems are reviewed and are negative for acute change except as noted in the HPI  Physical Exam Vital Signs  I have reviewed the triage vital signs BP (!) 141/100 (BP Location: Right Arm)   Pulse 81   Temp 98.4 F (36.9 C) (Oral)   Resp 18   Ht 5\' 6"  (1.676 m)   Wt (!) 163.3 kg   SpO2 100%   BMI 58.11 kg/m   Physical Exam Vitals reviewed.  Constitutional:      General: She is not in acute distress.    Appearance: She is well-developed. She is not diaphoretic.  HENT:     Head: Normocephalic and atraumatic.     Right Ear: External ear normal.     Left Ear: External ear normal.     Nose: Nose normal.  Eyes:     General: No scleral icterus.    Conjunctiva/sclera: Conjunctivae normal.  Neck:     Trachea: Phonation normal.   Cardiovascular:     Rate and Rhythm: Normal rate and regular rhythm.  Pulmonary:     Effort: Pulmonary effort is normal. No respiratory distress.     Breath sounds: No stridor.  Abdominal:     General: There is no distension.  Musculoskeletal:        General: Normal range of motion.     Cervical back: Normal range of motion.  Neurological:     Mental Status: She is alert and oriented to person, place, and time.  Psychiatric:        Behavior: Behavior normal.    ED Results and Treatments Labs (all labs ordered are listed, but only abnormal results are displayed) Labs Reviewed - No data to display  EKG  EKG Interpretation  Date/Time:    Ventricular Rate:    PR Interval:    QRS Duration:   QT Interval:    QTC Calculation:   R Axis:     Text Interpretation:         Radiology No results  found.  Pertinent labs & imaging results that were available during my care of the patient were reviewed by me and considered in my medical decision making (see MDM for details).  Medications Ordered in ED Medications - No data to display                                                                                                                                   Procedures Procedures  (including critical care time)  Medical Decision Making / ED Course I have reviewed the nursing notes for this encounter and the patient's prior records (if available in EHR or on provided paperwork).  Tiffany Conley was evaluated in Emergency Department on 07/25/2021 for the symptoms described in the history of present illness. She was evaluated in the context of the global COVID-19 pandemic, which necessitated consideration that the patient might be at risk for infection with the SARS-CoV-2 virus that causes COVID-19. Institutional protocols and algorithms that pertain to the evaluation of patients at risk for COVID-19 are in a state of rapid change based on information released by regulatory bodies including the CDC and federal and state organizations. These policies and algorithms were followed during the patient's care in the ED.     Most consistent with inflamed sebaceous cyst. Does not appear to be infected currently and does not require antibiotics. Given the recurrence, doubt abscess. Recommended continued supportive management for now.  Surgery follow up for capsule excision.   Pertinent labs & imaging results that were available during my care of the patient were reviewed by me and considered in my medical decision making:    Final Clinical Impression(s) / ED Diagnoses Final diagnoses:  Inflamed sebaceous cyst   The patient appears reasonably screened and/or stabilized for discharge and I doubt any other medical condition or other [facility] requiring further screening, evaluation, or  treatment in the ED at this time prior to discharge. Safe for discharge with strict return precautions.  Disposition: Discharge  Condition: Good  I have discussed the results, Dx and Tx plan with the patient/family who expressed understanding and agree(s) with the plan. Discharge instructions discussed at length. The patient/family was given strict return precautions who verbalized understanding of the instructions. No further questions at time of discharge.    ED Discharge Orders     None         Follow Up: Olive Bass, FNP [address] [place] [address] [phone]  Call  as needed  Surgery, [facility] [address] [place] [address] [place] [address] [phone]  Call  to schedule an appointment  for close follow up     This chart was dictated using voice recognition software.  Despite best efforts to proofread,  errors can occur which can change the documentation meaning.    Nira Conn, MD 07/25/21 [phone]

## 2021-07-24 NOTE — ED Notes (Signed)
Pt verbalizes understanding of discharge instructions. Opportunity for questioning and answers were provided. Armand removed by staff, pt discharged from ED to home. Educated to f/u with general surgery.

## 2021-07-24 NOTE — ED Triage Notes (Signed)
Pt presents to ED POV. Pt c/o abscess to back of neck x1y. Pt reports that 1y ago pcp told her it was spider bite and was given antibiotics. Pt took full course. Pt reports that since then it has come back 3x and usually drains by self. Pt reports every time it gets more painful

## 2022-02-25 ENCOUNTER — Ambulatory Visit: Payer: BC Managed Care – PPO | Admitting: Family Medicine

## 2022-02-25 ENCOUNTER — Encounter: Payer: Self-pay | Admitting: Family Medicine

## 2022-02-25 VITALS — BP 110/60 | HR 76 | Temp 98.6°F | Resp 18 | Ht 66.0 in | Wt 333.4 lb

## 2022-02-25 DIAGNOSIS — M62838 Other muscle spasm: Secondary | ICD-10-CM | POA: Diagnosis not present

## 2022-02-25 DIAGNOSIS — R42 Dizziness and giddiness: Secondary | ICD-10-CM

## 2022-02-25 MED ORDER — MECLIZINE HCL 25 MG PO TABS: 25.0000 mg | ORAL_TABLET | Freq: Three times a day (TID) | ORAL | 0 refills | Status: DC | PRN

## 2022-02-25 MED ORDER — ONDANSETRON HCL 8 MG PO TABS: 8.0000 mg | ORAL_TABLET | Freq: Three times a day (TID) | ORAL | 0 refills | Status: DC | PRN

## 2022-02-25 NOTE — Patient Instructions (Signed)
Vertigo: - Try to change positions slowly - Home exercises provided. If not helping, we can refer to physical therapy - Meclizine as needed (this might make you drowsy)  Neck spasm: - Try taking ibuprofen or Aleve twice daily for the antiinflammatory effect (adding some Zofran since you said this will sometimes make you nauseous).  - Home exercises - handout provided.  - Heating pad, massage, etc.  - If not improving, we can refer to physical therapy.   Please contact office for follow-up if symptoms do not improve or worsen. Seek emergency care if symptoms become severe.

## 2022-02-25 NOTE — Progress Notes (Signed)
Acute Office Visit  Subjective:     Patient ID: Tiffany Conley, female    DOB: Nov 25, 1980, 41 y.o.   MRN: [id number]  Chief Complaint  Patient presents with   Dizziness    2-3 times a day. This started 2-3 weeks ago that started after left sided neck pain.     Dizziness The current episode started 1 to 4 weeks ago. The problem occurs 2 to 4 times per day. The problem has been waxing and waning. Associated symptoms include nausea, neck pain and vomiting. Pertinent negatives include no abdominal pain, chest pain, congestion, coughing, diaphoresis, fatigue, fever, headaches, numbness, rash, sore throat or weakness. Associated symptoms comments: Spinning sensation  . The symptoms are aggravated by standing and bending (rolling in the bed (more so the left side)). She has tried lying down for the symptoms. The treatment provided moderate relief.  Last Saturday, symptoms were severe enough to make her vomit a few times. Reports year round allergies. No history of anything similar or vertigo. Patient denies any chest pain, palpitations, dyspnea, wheezing, edema, recurrent headaches, vision changes.    Additionally she reports some left sided neck tension/discomfort that has been ongoing for longer than the dizziness. She has some radiation across trap and into arm, but no numbness, tingling, weakness.     ROS All review of systems negative except what is listed in the HPI        Objective:    BP 110/60 (BP Location: Left Arm, Patient Position: Sitting, Cuff Size: Large)   Pulse 76   Temp 98.6 F (37 C) (Oral)   Resp 18   Ht 5\' 6"  (1.676 m)   Wt (!) 333 lb 6.4 oz (151.2 kg)   SpO2 99%   BMI 53.81 kg/m    Physical Exam Constitutional:      Appearance: Normal appearance.  HENT:     Head: Normocephalic and atraumatic.     Comments: Positive Dix Hallpike bilaterally, left worse than right     Right Ear: Tympanic membrane normal.     Left Ear: Tympanic membrane normal.   Neck:     Comments: Left upper trap tension, full ROM Cardiovascular:     Rate and Rhythm: Normal rate and regular rhythm.  Pulmonary:     Effort: Pulmonary effort is normal.     Breath sounds: Normal breath sounds.  Musculoskeletal:     Cervical back: Neck supple. No rigidity or tenderness.  Lymphadenopathy:     Cervical: No cervical adenopathy.  Skin:    General: Skin is warm and dry.  Neurological:     General: No focal deficit present.     Mental Status: She is alert and oriented to person, place, and time. Mental status is at baseline.     Motor: No weakness.     Coordination: Coordination normal.  Psychiatric:        Mood and Affect: Mood normal.        Behavior: Behavior normal.        Thought Content: Thought content normal.        Judgment: Judgment normal.     No results found for any visits on 02/25/22.      Assessment & Plan:   Problem List Items Addressed This Visit   None Visit Diagnoses     Neck muscle spasm    -  Primary - Try taking ibuprofen or Aleve twice daily for the antiinflammatory effect (adding some Zofran since you said this will sometimes  make you nauseous).  - Home exercises - handout provided.  - Heating pad, massage, etc.  - If not improving, we can refer to physical therapy.     Vertigo     - Try to change positions slowly - Home exercises provided. If not helping, we can refer to physical therapy - Meclizine as needed (this might make you drowsy)    Relevant Medications   ondansetron (ZOFRAN) 8 MG tablet   meclizine (ANTIVERT) 25 MG tablet       Meds ordered this encounter  Medications   ondansetron (ZOFRAN) 8 MG tablet    Sig: Take 1 tablet (8 mg total) by mouth every 8 (eight) hours as needed for nausea or vomiting.    Dispense:  20 tablet    Refill:  0    Order Specific Question:   Supervising Provider    Answer:   Danise Edge A [[id number]]   meclizine (ANTIVERT) 25 MG tablet    Sig: Take 1 tablet (25 mg total) by mouth  3 (three) times daily as needed for dizziness.    Dispense:  30 tablet    Refill:  0    Order Specific Question:   Supervising Provider    Answer:   Danise Edge A [[id number]]    Return if symptoms worsen or fail to improve.  Clayborne Dana, NP

## 2022-04-14 ENCOUNTER — Encounter (INDEPENDENT_AMBULATORY_CARE_PROVIDER_SITE_OTHER): Payer: Self-pay

## 2022-07-02 ENCOUNTER — Encounter: Payer: Self-pay | Admitting: Family

## 2022-07-02 ENCOUNTER — Ambulatory Visit: Payer: BC Managed Care – PPO | Admitting: Family

## 2022-07-02 VITALS — BP 122/80 | HR 73 | Temp 98.4°F | Ht 66.0 in | Wt 340.8 lb

## 2022-07-02 DIAGNOSIS — Z124 Encounter for screening for malignant neoplasm of cervix: Secondary | ICD-10-CM

## 2022-07-02 DIAGNOSIS — R5383 Other fatigue: Secondary | ICD-10-CM

## 2022-07-02 DIAGNOSIS — N644 Mastodynia: Secondary | ICD-10-CM | POA: Diagnosis not present

## 2022-07-02 DIAGNOSIS — R7309 Other abnormal glucose: Secondary | ICD-10-CM | POA: Diagnosis not present

## 2022-07-02 DIAGNOSIS — L732 Hidradenitis suppurativa: Secondary | ICD-10-CM

## 2022-07-02 LAB — COMPREHENSIVE METABOLIC PANEL
ALT: 16 U/L (ref 0–35)
AST: 13 U/L (ref 0–37)
Albumin: 3.8 g/dL (ref 3.5–5.2)
Alkaline Phosphatase: 74 U/L (ref 39–117)
BUN: 14 mg/dL (ref 6–23)
CO2: 29 mEq/L (ref 19–32)
Calcium: 8.9 mg/dL (ref 8.4–10.5)
Chloride: 104 mEq/L (ref 96–112)
Creatinine, Ser: 0.78 mg/dL (ref 0.40–1.20)
GFR: 93.92 mL/min (ref >60.00)
Glucose, Bld: 91 mg/dL (ref 70–99)
Potassium: 4.6 mEq/L (ref 3.5–5.1)
Sodium: 136 mEq/L (ref 135–145)
Total Bilirubin: 0.4 mg/dL (ref 0.2–1.2)
Total Protein: 6.7 g/dL (ref 6.0–8.3)

## 2022-07-02 LAB — CBC WITH DIFFERENTIAL/PLATELET
Basophils Absolute: 0.1 10*3/uL (ref 0.0–0.1)
Basophils Relative: 0.6 % (ref 0.0–3.0)
Eosinophils Absolute: 0.3 10*3/uL (ref 0.0–0.7)
Eosinophils Relative: 2.8 % (ref 0.0–5.0)
HCT: 44.1 % (ref 36.0–46.0)
Hemoglobin: 14.7 g/dL (ref 12.0–15.0)
Lymphocytes Relative: 27.3 % (ref 12.0–46.0)
Lymphs Abs: 2.6 10*3/uL (ref 0.7–4.0)
MCHC: 33.4 g/dL (ref 30.0–36.0)
MCV: 94.9 fl (ref 78.0–100.0)
Monocytes Absolute: 0.9 10*3/uL (ref 0.1–1.0)
Monocytes Relative: 9.1 % (ref 3.0–12.0)
Neutro Abs: 5.7 10*3/uL (ref 1.4–7.7)
Neutrophils Relative %: 60.2 % (ref 43.0–77.0)
Platelets: 204 10*3/uL (ref 150.0–400.0)
RBC: 4.65 Mil/uL (ref 3.87–5.11)
RDW: 14 % (ref 11.5–15.5)
WBC: 9.5 10*3/uL (ref 4.0–10.5)

## 2022-07-02 LAB — TSH: TSH: 1.02 u[IU]/mL (ref 0.35–5.50)

## 2022-07-02 LAB — HEMOGLOBIN A1C: Hgb A1c MFr Bld: 5.6 % (ref 4.6–6.5)

## 2022-07-02 MED ORDER — DOXYCYCLINE HYCLATE 100 MG PO TABS: 100.0000 mg | ORAL_TABLET | Freq: Two times a day (BID) | ORAL | 0 refills | Status: DC

## 2022-07-02 NOTE — Progress Notes (Signed)
Tiffany Conley is a 41 y.o. female with the following history as recorded in Epic[facility]:  Patient Active Problem List   Diagnosis Date Noted   Upper airway cough syndrome 10/10/2019   Morbid (severe) obesity due to excess calories ([facility]) 09/06/2019   Body mass index (BMI) 50.0-59.9, adult ([place]) 06/27/2019   Mild persistent asthma, poorly controlled 04/14/2018   Cigarette smoker 04/14/2018   Dyspnea on exertion    Asthmatic bronchitis with acute exacerbation 03/21/2018   Acute lateral meniscus tear of left knee 08/23/2017   Diarrhea 06/01/2017   Nausea with vomiting 04/18/2017   Low back pain 04/18/2017   Occipital neuralgia of right side 04/27/2016   Common migraine without intractability 04/27/2016   Neck pain 04/27/2016   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 20 tablet 0   levonorgestrel (MIRENA) 20 MCG/24HR IUD 1 each by Intrauterine route once. Placed June 2020     No current facility-administered medications for this visit.    Allergies: Citrus, Ibuprofen, and Vicodin [hydrocodone-acetaminophen]  Past Medical History:  Diagnosis Date   Asthma    Headache    Sciatic nerve pain    UTI (lower urinary tract infection)     Past Surgical History:  Procedure Laterality Date   APPENDECTOMY     CHOLECYSTECTOMY     ENDOMETRIAL ABLATION     KNEE ARTHROSCOPY     SHOULDER SURGERY Left    TONSILLECTOMY      Family History  Problem Relation Age of Onset   Colon cancer Maternal Grandmother    Healthy Mother    Allergic rhinitis Mother    Congestive Heart Failure Father    Allergic rhinitis Sister     Social History   Tobacco Use   Smoking status: Some Days    Packs/day: 1.00    Years: 10.00    Total  pack years: 10.00    Types: Cigarettes    Last attempt to quit: 07/08/2019    Years since quitting: 2.9   Smokeless tobacco: Never  Substance Use Topics   Alcohol use: No    Subjective:   Patient presents with concerns for left nipple pain- feels like there is a mass in the nipple but denies any discharge; overdue for mammogram; Also having recurrent skin infections underneath her breasts/ along bra line;   Has been feeling more tired lately;      Objective:  Vitals:   07/02/22 0939 07/02/22 0946 07/02/22 1012  BP: (!) 144/100 (!) 126/100 122/80  Pulse: 73    Temp: 98.4 F (36.9 C)    TempSrc: Oral    SpO2: 98%    Weight: (!) 340 lb 12.8 oz (154.6 kg)    Height: _0  (1.676 m)      General: Well developed, well nourished, in no acute distress  Skin : Warm and dry.  Head: Normocephalic and atraumatic  Eyes: Sclera and conjunctiva clear; pupils round and reactive to light; extraocular movements intact  Ears: External normal; canals clear; tympanic membranes normal  Oropharynx: Pink, supple. No suspicious lesions  Neck: Supple without thyromegaly, adenopathy  Lungs: Respirations unlabored; clear to auscultation bilaterally without wheeze, rales, rhonchi  CVS exam: normal rate and regular rhythm.  Neurologic: Alert and oriented; speech intact;  face symmetrical; moves all extremities well; CNII-XII intact without focal deficit   Assessment:  1. Cervical cancer screening   2. Nipple pain   3. Other fatigue   4. Elevated glucose   5. Hidradenitis     Plan:  Needs to establish with new GYN; referral updated; Order for diagnostic mammogram updated- follow up to be determined;  Will update labs today- follow up to be determined; Check Hgba1c; Rx for Doxycycline 100 mg bid x 10 days; to refer back to surgery to discuss long-term management since this is a recurrent issue;   No follow-ups on file.  Orders Placed This Encounter  Procedures   MM Digital Diagnostic Bilat     Standing Status:   Future    Standing Expiration Date:   07/03/2023    Order Specific Question:   Reason for Exam (SYMPTOM  OR DIAGNOSIS REQUIRED)    Answer:   nipple pain/ breast pain    Order Specific Question:   Is the patient pregnant?    Answer:   No    Order Specific Question:   Preferred imaging location?    Answer:   [facility]   CBC with Differential/Platelet   Comp Met (CMET)   TSH   Hemoglobin A1c   Ambulatory referral to Obstetrics / Gynecology    Referral Priority:   Routine    Referral Type:   Consultation    Referral Reason:   Specialty Services Required    Requested Specialty:   Obstetrics and Gynecology    Number of Visits Requested:   1   Ambulatory referral to General Surgery    Referral Priority:   Routine    Referral Type:   Surgical    Referral Reason:   Specialty Services Required    Requested Specialty:   General Surgery    Number of Visits Requested:   1    Requested Prescriptions   Signed Prescriptions Disp Refills   doxycycline (VIBRA-TABS) 100 MG tablet 20 tablet 0    Sig: Take 1 tablet (100 mg total) by mouth 2 (two) times daily.

## 2022-07-14 ENCOUNTER — Other Ambulatory Visit: Payer: Self-pay | Admitting: Family

## 2022-07-14 DIAGNOSIS — N644 Mastodynia: Secondary | ICD-10-CM

## 2022-07-26 ENCOUNTER — Telehealth: Payer: Self-pay | Admitting: Family

## 2022-07-26 NOTE — Telephone Encounter (Signed)
Pt would like to know if pcp could do another round of antibiotics as cysts still seems l=to be infected.    Medication: doxycycline (VIBRA-TABS) 100 MG tablet  Has the patient contacted their pharmacy? No.   Preferred Pharmacy:   [facility] [address] [place]), [place] - [address] [place] ([place]) [place] [address] Phone: [phone]  Fax: [phone]

## 2022-07-28 NOTE — Telephone Encounter (Signed)
I have called the pt and informed her of providers message. She stated that she never got a call from general surgery so I given her the phone number and address so she can call and follow up about a visit.   Pt reports that the abscess is came back under the left boob like it was before. When she was on the antibiotics it cleared up and then as time went on it opened back up at the same place.   Please advise what we can do in the mean time.

## 2022-08-03 ENCOUNTER — Other Ambulatory Visit: Payer: Self-pay | Admitting: Family

## 2022-08-03 MED ORDER — DOXYCYCLINE HYCLATE 100 MG PO TABS: 100.0000 mg | ORAL_TABLET | Freq: Two times a day (BID) | ORAL | 0 refills | Status: DC

## 2022-08-03 NOTE — Telephone Encounter (Signed)
Called pt she is aware that Rx has been sent it and states she called surgeon, and is awaiting a phone call back to schedule surgery.

## 2022-08-04 ENCOUNTER — Ambulatory Visit
Admission: RE | Admit: 2022-08-04 | Discharge: 2022-08-04 | Disposition: A | Discharge status: Home / Self Care | Payer: BC Managed Care – PPO | Source: Ambulatory Visit | Attending: Family | Admitting: Family

## 2022-08-04 ENCOUNTER — Other Ambulatory Visit: Payer: Self-pay | Admitting: Family

## 2022-08-04 ENCOUNTER — Telehealth: Payer: Self-pay | Admitting: Family

## 2022-08-04 DIAGNOSIS — R92313 Mammographic fatty tissue density, bilateral breasts: Secondary | ICD-10-CM | POA: Diagnosis not present

## 2022-08-04 DIAGNOSIS — N644 Mastodynia: Secondary | ICD-10-CM

## 2022-08-04 DIAGNOSIS — N6313 Unspecified lump in the right breast, lower outer quadrant: Secondary | ICD-10-CM | POA: Diagnosis not present

## 2022-08-04 DIAGNOSIS — N6002 Solitary cyst of left breast: Secondary | ICD-10-CM

## 2022-08-04 DIAGNOSIS — N6323 Unspecified lump in the left breast, lower outer quadrant: Secondary | ICD-10-CM | POA: Diagnosis not present

## 2022-08-04 NOTE — Telephone Encounter (Signed)
Pt called stating that [facility] told her that they do not have the referral that was sent on 10.27.23. Referral coordinator told her to instruct [place] to resend it labeled as urgent and put emphasis on the cyst on left nipple.

## 2022-09-08 ENCOUNTER — Telehealth: Payer: Self-pay | Admitting: Family

## 2022-09-08 NOTE — Telephone Encounter (Signed)
Patient called back to report change in the cyst on her nipple. She said it has become swollen and really painful. She wanted to check on the referral for general surgeon. Advised referral was resent to [facility]. Sent mychart message to patient with office number since she was walking her dogs. Patient would like a call back regarding the changes in her nipple.

## 2022-09-09 ENCOUNTER — Other Ambulatory Visit: Payer: Self-pay | Admitting: Family

## 2022-09-09 DIAGNOSIS — N6002 Solitary cyst of left breast: Secondary | ICD-10-CM

## 2022-09-09 DIAGNOSIS — L732 Hidradenitis suppurativa: Secondary | ICD-10-CM | POA: Diagnosis not present

## 2022-09-09 DIAGNOSIS — F17210 Nicotine dependence, cigarettes, uncomplicated: Secondary | ICD-10-CM | POA: Diagnosis not present

## 2022-09-09 DIAGNOSIS — Z6841 Body Mass Index (BMI) 40.0 and over, adult: Secondary | ICD-10-CM | POA: Diagnosis not present

## 2022-09-10 NOTE — Telephone Encounter (Signed)
Pt called back and stated she did speak with her surgeon 09/09/2022 she is on a round of antibiotics and a cream, Pt is aware she should be expecting a phone call to schedule a breast ultrasound.

## 2022-09-10 NOTE — Telephone Encounter (Signed)
Called pt and left a VM letting her know that the referral has been sent.

## 2022-09-15 ENCOUNTER — Other Ambulatory Visit: Payer: Self-pay | Admitting: Family

## 2022-09-15 DIAGNOSIS — N6002 Solitary cyst of left breast: Secondary | ICD-10-CM

## 2022-09-23 ENCOUNTER — Other Ambulatory Visit: Payer: Self-pay | Admitting: Family

## 2022-09-23 ENCOUNTER — Ambulatory Visit
Admission: RE | Admit: 2022-09-23 | Discharge: 2022-09-23 | Disposition: A | Discharge status: Home / Self Care | Payer: BC Managed Care – PPO | Source: Ambulatory Visit | Attending: Family | Admitting: Family

## 2022-09-23 DIAGNOSIS — N6002 Solitary cyst of left breast: Secondary | ICD-10-CM

## 2022-09-23 DIAGNOSIS — N644 Mastodynia: Secondary | ICD-10-CM | POA: Diagnosis not present

## 2022-10-04 ENCOUNTER — Ambulatory Visit: Payer: BC Managed Care – PPO

## 2022-10-04 ENCOUNTER — Ambulatory Visit
Admission: RE | Admit: 2022-10-04 | Discharge: 2022-10-04 | Disposition: A | Discharge status: Home / Self Care | Payer: BC Managed Care – PPO | Source: Ambulatory Visit | Attending: Family | Admitting: Family

## 2022-10-04 DIAGNOSIS — N6489 Other specified disorders of breast: Secondary | ICD-10-CM | POA: Diagnosis not present

## 2022-10-04 DIAGNOSIS — N6002 Solitary cyst of left breast: Secondary | ICD-10-CM

## 2022-10-05 ENCOUNTER — Other Ambulatory Visit: Payer: Self-pay | Admitting: Family

## 2022-10-05 DIAGNOSIS — N611 Abscess of the breast and nipple: Secondary | ICD-10-CM

## 2022-10-25 ENCOUNTER — Ambulatory Visit
Admission: RE | Admit: 2022-10-25 | Discharge: 2022-10-25 | Disposition: A | Discharge status: Home / Self Care | Payer: BC Managed Care – PPO | Source: Ambulatory Visit | Attending: Family | Admitting: Family

## 2022-10-25 DIAGNOSIS — N611 Abscess of the breast and nipple: Secondary | ICD-10-CM

## 2022-10-25 DIAGNOSIS — N6489 Other specified disorders of breast: Secondary | ICD-10-CM | POA: Diagnosis not present

## 2022-11-23 ENCOUNTER — Encounter: Payer: Self-pay | Admitting: Obstetrics and Gynecology

## 2022-11-23 ENCOUNTER — Other Ambulatory Visit: Payer: Self-pay

## 2022-11-23 ENCOUNTER — Ambulatory Visit: Payer: BC Managed Care – PPO | Admitting: Obstetrics and Gynecology

## 2022-11-23 ENCOUNTER — Other Ambulatory Visit (HOSPITAL_COMMUNITY)
Admission: RE | Admit: 2022-11-23 | Discharge: 2022-11-23 | Disposition: A | Discharge status: Home / Self Care | Payer: BC Managed Care – PPO | Source: Ambulatory Visit | Attending: Obstetrics and Gynecology | Admitting: Obstetrics and Gynecology

## 2022-11-23 VITALS — BP 130/67 | HR 83 | Ht 66.0 in | Wt 335.0 lb

## 2022-11-23 DIAGNOSIS — Z01419 Encounter for gynecological examination (general) (routine) without abnormal findings: Secondary | ICD-10-CM

## 2022-11-23 DIAGNOSIS — Z113 Encounter for screening for infections with a predominantly sexual mode of transmission: Secondary | ICD-10-CM

## 2022-11-23 DIAGNOSIS — N946 Dysmenorrhea, unspecified: Secondary | ICD-10-CM

## 2022-11-23 DIAGNOSIS — R102 Pelvic and perineal pain: Secondary | ICD-10-CM | POA: Diagnosis not present

## 2022-11-23 DIAGNOSIS — Z6841 Body Mass Index (BMI) 40.0 and over, adult: Secondary | ICD-10-CM

## 2022-11-23 DIAGNOSIS — Z8742 Personal history of other diseases of the female genital tract: Secondary | ICD-10-CM | POA: Diagnosis not present

## 2022-11-23 MED ORDER — MYFEMBREE 40-1-0.5 MG PO TABS: 1.0000 | ORAL_TABLET | Freq: Every day | ORAL | 8 refills | Status: DC

## 2022-11-23 NOTE — Progress Notes (Signed)
GYNECOLOGY ANNUAL PREVENTATIVE CARE ENCOUNTER NOTE  History:     Tiffany Conley is a 42 y.o. G0P0000 female here for a routine annual gynecologic exam.  Current complaints: pain with menses.   Denies abnormal vaginal bleeding, discharge, pelvic pain, problems with intercourse or other gynecologic concerns.    Gynecologic History Patient's last menstrual period was 10/21/2022. Contraception: IUD Last Pap: 09/18/14. 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 Results were: abnormal with small mass/abscess adjacent to left nipple  Obstetric History OB History  Gravida Para Term Preterm AB Living  0 0 0 0 0 0  SAB IAB Ectopic Multiple Live Births  0 0 0 0      Past Medical History:  Diagnosis Date   Asthma    Headache    Sciatic nerve pain    UTI (lower urinary tract infection)     Past Surgical History:  Procedure Laterality Date   APPENDECTOMY     CHOLECYSTECTOMY     ENDOMETRIAL ABLATION     KNEE ARTHROSCOPY     SHOULDER SURGERY Left    TONSILLECTOMY      Current Outpatient Medications on File Prior to Visit  Medication Sig Dispense Refill   levonorgestrel (MIRENA) 20 MCG/24HR IUD 1 each by Intrauterine route once. Placed June 2020     Multiple Vitamins-Minerals (MULTIVITAMIN GUMMIES ADULT PO) Take by mouth.     No current facility-administered medications on file prior to visit.    Allergies  Allergen Reactions   Citrus Itching and Rash   Ibuprofen Nausea Only   Vicodin [Hydrocodone-Acetaminophen] Nausea And Vomiting    Social History:  reports that she has been smoking cigarettes. She has a 10.00 pack-year smoking history. She has never used smokeless tobacco. She reports current drug use. Frequency: 7.00 times per week. Drug: Marijuana. She reports that she does not drink alcohol.  Family History  Problem Relation Age of Onset   Colon cancer Maternal Grandmother    Healthy Mother    Allergic rhinitis Mother    Congestive Heart  Failure Father    Allergic rhinitis Sister     The following portions of the patient's history were reviewed and updated as appropriate: allergies, current medications, past family history, past medical history, past social history, past surgical history and problem list.  Review of Systems Pertinent items noted in HPI and remainder of comprehensive ROS otherwise negative.  Physical Exam:  BP 130/67   Pulse 83   Ht 5\' 6"  (1.676 m)   Wt (!) 335 lb (152 kg)   LMP 10/21/2022 Comment: IUD placed 2020  BMI 54.07 kg/m  CONSTITUTIONAL: Well-developed, well-nourished female in no acute distress.  HENT:  Normocephalic, atraumatic, External right and left ear normal. Oropharynx is clear and moist EYES: Conjunctivae and EOM are normal. Pupils are equal, round, and reactive to light. No scleral icterus.  NECK: Normal range of motion, supple, no masses.  Normal thyroid.  SKIN: Skin is warm and dry. No rash noted. Not diaphoretic. No erythema. No pallor. MUSCULOSKELETAL: Normal range of motion. No tenderness.  No cyanosis, clubbing, or edema.  2+ distal pulses. NEUROLOGIC: Alert and oriented to person, place, and time. Normal reflexes, muscle tone coordination.  PSYCHIATRIC: Normal mood and affect. Normal behavior. Normal judgment and thought content. CARDIOVASCULAR: Normal heart rate noted, regular rhythm RESPIRATORY: Clear to auscultation bilaterally. Effort and breath sounds normal, no problems with respiration noted. BREASTS: Symmetric in size. No masses, tenderness, skin changes, nipple drainage, or lymphadenopathy bilaterally.  Performed in the presence of a chaperone. ABDOMEN: Soft, no distention noted.  No tenderness, rebound or guarding.  PELVIC: Normal appearing external genitalia and urethral meatus; normal appearing vaginal mucosa and cervix.  No abnormal discharge noted.  Pap smear obtained.  Normal uterine size, no other palpable masses, no uterine or adnexal tenderness.  Performed in the  presence of a chaperone.   Assessment and Plan:    1. Women's annual routine gynecological examination Normal annual exam Discussed current weight and BMI.  Pt does not desire surgical management.  She notes dietary and exercise modifications have been made. Discussed possible usage of GLP-1 injections to help with weight loss.  She will discuss with her PCP. - Cytology - PAP  2. Pelvic pain in female  - Relugolix-Estradiol-Norethind (MYFEMBREE) 40-1-0.5 MG TABS; Take 1 tablet by mouth daily.  Dispense: 28 tablet; Refill: 8  3. Dysmenorrhea  - Relugolix-Estradiol-Norethind (MYFEMBREE) 40-1-0.5 MG TABS; Take 1 tablet by mouth daily.  Dispense: 28 tablet; Refill: 8  4. History of endometriosis Pt notes remote diagnosis of endometriosis by Dr. Gertie Fey several years ago, but she never received any treatment other than OCP.  Pt still notes worsening pain with menses.  She is willing to opt for treatment with myfembree .  Will follow up in 4 months to see if pain has improved. - Relugolix-Estradiol-Norethind (MYFEMBREE) 40-1-0.5 MG TABS; Take 1 tablet by mouth daily.  Dispense: 28 tablet; Refill: 8  5. Routine screening for STI (sexually transmitted infection) Per pt request  - Cervicovaginal ancillary only - Hepatitis B surface antigen - Hepatitis C antibody - RPR - HIV Antibody (routine testing w rflx)  6. Morbid (severe) obesity due to excess calories ([place])   7. Body mass index (BMI) 50.0-59.9, adult ([place]) See above  Will follow up results of pap smear and manage accordingly. Routine preventative health maintenance measures emphasized. Please refer to After Visit Summary for other counseling recommendations.      Lynnda Shields, MD, [place] for [facility], [place]

## 2022-11-24 LAB — CERVICOVAGINAL ANCILLARY ONLY
Bacterial Vaginitis (gardnerella): NEGATIVE
Candida Glabrata: NEGATIVE
Candida Vaginitis: NEGATIVE
Chlamydia: NEGATIVE
Comment: NEGATIVE
Comment: NEGATIVE
Comment: NEGATIVE
Comment: NEGATIVE
Comment: NEGATIVE
Comment: NORMAL
Neisseria Gonorrhea: NEGATIVE
Trichomonas: NEGATIVE

## 2022-11-25 LAB — RPR: RPR Ser Ql: NONREACTIVE

## 2022-11-25 LAB — HIV ANTIBODY (ROUTINE TESTING W REFLEX): HIV Screen 4th Generation wRfx: NONREACTIVE

## 2022-11-25 LAB — HEPATITIS C ANTIBODY: Hep C Virus Ab: NONREACTIVE

## 2022-11-25 LAB — HEPATITIS B SURFACE ANTIGEN: Hepatitis B Surface Ag: NEGATIVE

## 2022-11-26 LAB — CYTOLOGY - PAP
Comment: NEGATIVE
Diagnosis: NEGATIVE
High risk HPV: NEGATIVE

## 2023-02-02 ENCOUNTER — Other Ambulatory Visit: Payer: BC Managed Care – PPO

## 2023-09-08 ENCOUNTER — Telehealth: Payer: BC Managed Care – PPO | Admitting: Family Medicine

## 2023-09-08 ENCOUNTER — Ambulatory Visit: Payer: Self-pay | Admitting: Family

## 2023-09-08 ENCOUNTER — Encounter: Payer: Self-pay | Admitting: Family Medicine

## 2023-09-08 DIAGNOSIS — U071 COVID-19: Secondary | ICD-10-CM

## 2023-09-08 MED ORDER — NIRMATRELVIR/RITONAVIR (PAXLOVID)TABLET: 3.0000 | ORAL_TABLET | Freq: Two times a day (BID) | ORAL | 0 refills | Status: AC

## 2023-09-08 NOTE — Telephone Encounter (Signed)
 Appt today

## 2023-09-08 NOTE — Progress Notes (Signed)
 [facility] [address], [address] [place], [place] [address] [phone]  Date:  09/08/2023   Name:  Tiffany Conley   DOB:  [birth year]   MRN:  [id number]  PCP:  Jason Leita Repine, FNP    Chief Complaint: No chief complaint on file.   History of Present Illness:  Tiffany Conley is a 43 y.o. very pleasant female patient who presents with the following:  Virtual visit today for sick visit-  Connected with patient via MyChart video.  The patient and myself are present on the call today.  Patient identity confirmed with 2 factors, patient location is home and my location is office.  History of asthma, obesity Today is Thursday- pt noted chest tightness and cough on Monday She then developed a fever to 101.3, and body aches Also nausea, headache No vomiting She has noted diarrhea She feels about the same except the body aches are a bit better Temp of 100  She has expired covid test at home-   No flu shot this year Most recent covid shot 2 years ago  Patient Active Problem List   Diagnosis Date Noted   Pelvic pain in female 11/23/2022   Dysmenorrhea 11/23/2022   History of endometriosis 11/23/2022   Upper airway cough syndrome 10/10/2019   Morbid (severe) obesity due to excess calories ([facility]) 09/06/2019   Body mass index (BMI) 50.0-59.9, adult ([facility]) 06/27/2019   Mild persistent asthma, poorly controlled 04/14/2018   Cigarette smoker 04/14/2018   Dyspnea on exertion    Asthmatic bronchitis with acute exacerbation 03/21/2018   Acute lateral meniscus tear of left knee 08/23/2017   Diarrhea 06/01/2017   Nausea with vomiting 04/18/2017   Low back pain 04/18/2017   Occipital neuralgia of right side 04/27/2016   Common migraine without intractability 04/27/2016   Neck pain 04/27/2016   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 04/27/2016   Internal derangement of knee 09/08/2010   Lateral meniscus tear  09/08/2010    Past Medical History:  Diagnosis Date   Asthma    Headache    Sciatic nerve pain    UTI (lower urinary tract infection)     Past Surgical History:  Procedure Laterality Date   APPENDECTOMY     CHOLECYSTECTOMY     ENDOMETRIAL ABLATION     KNEE ARTHROSCOPY     SHOULDER SURGERY Left    TONSILLECTOMY      Social History   Tobacco Use   Smoking status: Some Days    Current packs/day: 0.00    Average packs/day: 1 pack/day for 10.0 years (10.0 ttl pk-yrs)    Types: Cigarettes    Start date: 07/07/2009    Last attempt to quit: 07/08/2019    Years since quitting: 4.1   Smokeless tobacco: Never  Vaping Use   Vaping status: Never Used  Substance Use Topics   Alcohol use: No   Drug use: Yes    Frequency: 7.0 times per week    Types: Marijuana    Family History  Problem Relation Age of Onset   Colon cancer Maternal Grandmother    Healthy Mother    Allergic rhinitis Mother    Congestive Heart Failure Father    Allergic rhinitis Sister     Allergies  Allergen Reactions   Citrus Itching and Rash   Ibuprofen  Nausea Only   Vicodin [Hydrocodone -Acetaminophen ] Nausea And Vomiting    Medication list has been reviewed and updated.  Current Outpatient Medications on File Prior to Visit  Medication Sig Dispense Refill   levonorgestrel (MIRENA) 20 MCG/24HR IUD 1 each by Intrauterine route once. Placed June 2020     Multiple Vitamins-Minerals (MULTIVITAMIN GUMMIES ADULT PO) Take by mouth.     Relugolix-Estradiol-Norethind (MYFEMBREE ) 40-1-0.5 MG TABS Take 1 tablet by mouth daily. 28 tablet 8   No current facility-administered medications on file prior to visit.    Review of Systems:  As per HPI- otherwise negative.   Physical Examination: There were no vitals filed for this visit. There were no vitals filed for this visit. There is no height or weight on file to calculate BMI. Ideal Body Weight:    Patient observed via MyChart video.  Obese, otherwise  looks well.  No distress or shortness of breath is noted, some cough  Assessment and Plan: COVID-19 - Plan: nirmatrelvir /ritonavir  (PAXLOVID ) 20 x 150 MG & 10 x 100MG  TABS  Virtual visit today for flu/COVID-like symptoms.  I asked patient to please take a home flu and COVID test and she plans to do so.  She will get back in touch with me with her results  Signed Harlene Schroeder, MD  Pt tested for covid and got back to me with picture of + result She would like to be treated with Paxlovid , most recent blood work was 1 year ago.  She declines repeat renal function testing.  We have no reason to suspect her renal function is not still normal.  Will start her on normal dose Paxlovid , I have asked her let me know if not improving in the next few days, sooner if getting worse

## 2023-09-08 NOTE — Telephone Encounter (Signed)
 Chief Complaint: Cough, fever Symptoms: Chest/Nasal congestion, fever, cough Frequency: Ongoing/Worsening since Monday Pertinent Negatives: Patient denies Vomiting, CP, SOB outside of exertion Disposition: [] ED /[] Urgent Care (no appt availability in office) / [x] Appointment(In office/virtual)/ []  [facility]/ [] Home Care/ [] Refused Recommended Disposition /[] [facility]/ []  Follow-up with PCP Additional Notes: Pt called reporting chest congestion/fever that began Monday and has since worsened. Pt sounds ill to this RN and was already scheduled a virtual visit prior to transfer which pt plans to keep. Notes fever on Tuesday as high as 101.3 and was 100 this AM. Pt has been taking Dayquil/Nyquil for symptom management at home. She reports SOB upon exertion but notes no additional concerns. Pt denies vomiting but reports nausea and diarrhea. This RN educated pt on additional home care, to return call or seek emergency care for new or worsening symptoms. Pt verbalized understanding and agrees to plan.   Copied from CRM [id number]. Topic: Appointments - Appointment Scheduling >> Sep 08, 2023 10:17 AM Tiffany H wrote: Patient/patient representative is calling to schedule an appointment. Refer to attachments for appointment information.  Reason for Disposition . Fever present > 3 days (72 hours)  Answer Assessment - Initial Assessment Questions 1. TEMPERATURE: What is the most recent temperature?  How was it measured?      100 this AM, 101.3 Tuesday 2. ONSET: When did the fever start?      Monday evening 3. CHILLS: Do you have chills? If yes: How bad are they?  (e.g., none, mild, moderate, severe)   - NONE: no chills   - MILD: feeling cold   - MODERATE: feeling very cold, some shivering (feels better under a thick blanket)   - SEVERE: feeling extremely cold with shaking chills (general body shaking, rigors; even under a thick blanket)      Severe body chills, face  feels hot 4. OTHER SYMPTOMS: Do you have any other symptoms besides the fever?  (e.g., abdomen pain, cough, diarrhea, earache, headache, sore throat, urination pain)     Cough, congestion, HA, productive cough with yellow drainage, nasal drainage is clear, nausea, diarrhea 6. CONTACTS: Does anyone else in the family have an infection?     Several co-workers ill 7. TREATMENT: What have you done so far to treat this fever? (e.g., medications)     Dayquil/Nyquil as instructed  Answer Assessment - Initial Assessment Questions 1. ONSET: When did the cough begin?      Monday 2. SEVERITY: How bad is the cough today?      Constant 3. SPUTUM: Describe the color of your sputum (none, dry cough; clear, white, yellow, green)     Sometimes productive, yellow  5. DIFFICULTY BREATHING: Are you having difficulty breathing? If Yes, ask: How bad is it? (e.g., mild, moderate, severe)    - MILD: No SOB at rest, mild SOB with walking, speaks normally in sentences, can lie down, no retractions, pulse < 100.    - MODERATE: SOB at rest, SOB with minimal exertion and prefers to sit, cannot lie down flat, speaks in phrases, mild retractions, audible wheezing, pulse 100-120.    - SEVERE: Very SOB at rest, speaks in single words, struggling to breathe, sitting hunched forward, retractions, pulse > 120      Moderate SOB with exertion 6. FEVER: Do you have a fever? If Yes, ask: What is your temperature, how was it measured, and when did it start?     100.0 this AM  8. LUNG HISTORY: Do  you have any history of lung disease?  (e.g., pulmonary embolus, asthma, emphysema)     Asthma  10. OTHER SYMPTOMS: Do you have any other symptoms? (e.g., runny nose, wheezing, chest pain)       Runny nose, fever, HA  Protocols used: Fever-A-AH, Cough - Acute Non-Productive-A-AH

## 2024-01-17 ENCOUNTER — Telehealth: Admitting: Family Medicine

## 2024-01-17 ENCOUNTER — Encounter: Payer: Self-pay | Admitting: Family Medicine

## 2024-01-17 DIAGNOSIS — J069 Acute upper respiratory infection, unspecified: Secondary | ICD-10-CM

## 2024-01-17 MED ORDER — FLUTICASONE PROPIONATE 50 MCG/ACT NA SUSP: 2.0000 | Freq: Every day | NASAL | 0 refills | Status: AC

## 2024-01-17 MED ORDER — BENZONATATE 100 MG PO CAPS: 100.0000 mg | ORAL_CAPSULE | Freq: Three times a day (TID) | ORAL | 0 refills | Status: DC | PRN

## 2024-01-17 NOTE — Progress Notes (Signed)

## 2024-01-18 ENCOUNTER — Telehealth: Admitting: Family Medicine

## 2024-02-07 ENCOUNTER — Telehealth: Admitting: Family

## 2024-02-07 ENCOUNTER — Encounter: Payer: Self-pay | Admitting: Family

## 2024-02-07 VITALS — Ht 66.0 in

## 2024-02-07 DIAGNOSIS — J4531 Mild persistent asthma with (acute) exacerbation: Secondary | ICD-10-CM

## 2024-02-07 DIAGNOSIS — J019 Acute sinusitis, unspecified: Secondary | ICD-10-CM | POA: Diagnosis not present

## 2024-02-07 MED ORDER — PREDNISONE 20 MG PO TABS: ORAL_TABLET | ORAL | 0 refills | Status: AC

## 2024-02-07 MED ORDER — AMOXICILLIN-POT CLAVULANATE 875-125 MG PO TABS: 1.0000 | ORAL_TABLET | Freq: Two times a day (BID) | ORAL | 0 refills | Status: AC

## 2024-02-07 NOTE — Progress Notes (Signed)
 Tiffany Conley is a 43 y.o. female with the following history as recorded in [employer]:  Patient Active Problem List   Diagnosis Date Noted   Pelvic pain in female 11/23/2022   Dysmenorrhea 11/23/2022   History of endometriosis 11/23/2022   Upper airway cough syndrome 10/10/2019   Morbid (severe) obesity due to excess calories (HCC) 09/06/2019   Body mass index (BMI) 50.0-59.9, adult (HCC) 06/27/2019   Mild persistent asthma, poorly controlled 04/14/2018   Cigarette smoker 04/14/2018   Dyspnea on exertion    Asthmatic bronchitis with acute exacerbation 03/21/2018   Acute lateral meniscus tear of left knee 08/23/2017   Diarrhea 06/01/2017   Nausea with vomiting 04/18/2017   Low back pain 04/18/2017   Occipital neuralgia of right side 04/27/2016   Common migraine without intractability 04/27/2016   Neck pain 04/27/2016   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 20 tablet 0   fluticasone  (FLONASE ) 50 MCG/ACT nasal spray Place 2 sprays into both nostrils daily. 16 g 0   levonorgestrel (MIRENA) 20 MCG/24HR IUD 1 each by Intrauterine route once. Placed June 2020     Multiple Vitamins-Minerals (MULTIVITAMIN GUMMIES ADULT PO) Take by mouth.     predniSONE  (DELTASONE ) 20 MG tablet Take 2 tablets daily x 2 days; then 1 tablet daily x 5 days; 9 tablet 0   No current facility-administered medications for this visit.    Allergies: Citrus, Ibuprofen , and Vicodin [hydrocodone -acetaminophen ]  Past Medical History:  Diagnosis Date   Asthma    Headache    Sciatic nerve pain    UTI (lower urinary tract infection)     Past Surgical History:  Procedure Laterality Date   APPENDECTOMY     CHOLECYSTECTOMY      ENDOMETRIAL ABLATION     KNEE ARTHROSCOPY     SHOULDER SURGERY Left    TONSILLECTOMY      Family History  Problem Relation Age of Onset   Colon cancer Maternal Grandmother    Healthy Mother    Allergic rhinitis Mother    Congestive Heart Failure Father    Allergic rhinitis Sister     Social History   Tobacco Use   Smoking status: Some Days    Current packs/day: 0.00    Average packs/day: 1 pack/day for 10.0 years (10.0 ttl pk-yrs)    Types: Cigarettes    Start date: 07/07/2009    Last attempt to quit: 07/08/2019    Years since quitting: 4.5   Smokeless tobacco: Never  Substance Use Topics   Alcohol use: No    Subjective:    I connected with [name] on 02/07/24 at  9:20 AM EDT by a video enabled telemedicine application and verified that I am speaking with the correct person using two identifiers.   I discussed the limitations of evaluation and management by telemedicine and the availability of in person appointments. The patient expressed understanding and agreed to proceed. Provider in office/ patient is at home; provider and patient are only 2 people on video call.   Concerned for acute sinus infection; did e-visit on 5/13 and treated for viral symptoms; concerned about persisting sinus and chest congestion; no chest pain or shortness of breath; occasional wheezing; smoking 1-2 cigarettes per day;  Objective:  Vitals:   02/07/24 0921  Height: 5\' 6"  (1.676 m)    General: Well developed, well nourished, in no acute distress  Skin : Warm and dry.  Head: Normocephalic and atraumatic  Lungs: Respirations unlabored;  Neurologic: Alert and oriented; speech intact; face symmetrical;   Assessment:  1. Mild persistent asthmatic bronchitis with acute exacerbation   2. Acute sinusitis, recurrence not specified, unspecified location     Plan:  Rx for Augmentin  and Prednisone  which she has done well on in the past; increase fluids, rest and encouraged to quit  smoking completely; follow up in person if symptoms persist.   No follow-ups on file.  No orders of the defined types were placed in this encounter.   Requested Prescriptions   Signed Prescriptions Disp Refills   amoxicillin -clavulanate (AUGMENTIN ) 875-125 MG tablet 20 tablet 0    Sig: Take 1 tablet by mouth 2 (two) times daily for 10 days.   predniSONE  (DELTASONE ) 20 MG tablet 9 tablet 0    Sig: Take 2 tablets daily x 2 days; then 1 tablet daily x 5 days;

## 2024-04-09 ENCOUNTER — Telehealth: Payer: Self-pay

## 2024-04-09 NOTE — Telephone Encounter (Signed)
 Copied from CRM [id number]. Topic: Clinical - Request for Lab/Test Order >> Apr 09, 2024  8:44 AM Mesmerise C wrote: Reason for CRM: Patient stated she has a cyst in left breast needing order to be [facility] to [facility] to she can have it looked at patient can be reached at [phone] once done

## 2024-04-10 ENCOUNTER — Other Ambulatory Visit: Payer: Self-pay | Admitting: Family

## 2024-04-10 DIAGNOSIS — N6002 Solitary cyst of left breast: Secondary | ICD-10-CM

## 2024-04-12 ENCOUNTER — Telehealth: Payer: Self-pay | Admitting: Family

## 2024-04-12 ENCOUNTER — Other Ambulatory Visit: Payer: Self-pay | Admitting: Family

## 2024-04-12 DIAGNOSIS — N6002 Solitary cyst of left breast: Secondary | ICD-10-CM

## 2024-04-12 MED ORDER — AMOXICILLIN-POT CLAVULANATE 875-125 MG PO TABS: 1.0000 | ORAL_TABLET | Freq: Two times a day (BID) | ORAL | 0 refills | Status: AC

## 2024-04-12 NOTE — Telephone Encounter (Signed)
 We were not able to find documentation of same, but patient is insistent that she was told by someone at the breast center that she should be on antibiotics prior to this procedure.  Therefore I sent in a prescription for Augmentin  for 5 days  She is having a cyst aspiration tomorrow

## 2024-04-12 NOTE — Telephone Encounter (Signed)
 Spoke with pt, pt states she was told she needs to have an antibiotic in my system for 24 hrs before procedure. Pt is requesting a RX of Augmentin .

## 2024-04-12 NOTE — Telephone Encounter (Signed)
 She is having a diagnostic mammogram and cyst aspiration tomorrow.  I called the breast center to see if there was any note regarding her needing antibiotics.  They did not see anything saying that she was supposed to have an antibiotic.    I am not sure if she thought she was supposed to use antibiotics due to her prior experience or if she is having any new symptoms or thinks there is an acute infection. (It looks like she was previously given antibiotics for a breast infection earlier this year).  Could you please call her and find out more about her concerns.  In summary, if this is just a routine cyst aspiration she does not have to take prophylactic antibiotics prior.

## 2024-04-12 NOTE — Telephone Encounter (Signed)
 Copied from CRM [id number]. Topic: Clinical - Medication Question >> Apr 12, 2024  8:16 AM Gennette ORN wrote: Reason for CRM: Patient needs antibiotic for 24 hours before the visit tomorrow at the breast center. The referral called her this morning to confirm everything.

## 2024-04-13 ENCOUNTER — Ambulatory Visit
Admission: RE | Admit: 2024-04-13 | Discharge: 2024-04-13 | Disposition: A | Discharge status: Home / Self Care | Source: Ambulatory Visit | Attending: Family | Admitting: Family

## 2024-04-13 DIAGNOSIS — R928 Other abnormal and inconclusive findings on diagnostic imaging of breast: Secondary | ICD-10-CM | POA: Diagnosis not present

## 2024-04-13 DIAGNOSIS — N6002 Solitary cyst of left breast: Secondary | ICD-10-CM
